# Patient Record
Sex: Female | Born: 1960 | Race: White | Hispanic: No | Marital: Married | Smoking: Current every day smoker
Health system: Southern US, Community
[De-identification: ages and names within clinical notes are randomized; demographics above are authoritative.]

## PROBLEM LIST (undated history)

## (undated) DIAGNOSIS — R7303 Prediabetes: Secondary | ICD-10-CM

## (undated) DIAGNOSIS — G20A1 Parkinson's disease without dyskinesia, without mention of fluctuations: Secondary | ICD-10-CM

## (undated) DIAGNOSIS — E559 Vitamin D deficiency, unspecified: Secondary | ICD-10-CM

## (undated) DIAGNOSIS — A630 Anogenital (venereal) warts: Secondary | ICD-10-CM

## (undated) DIAGNOSIS — N390 Urinary tract infection, site not specified: Secondary | ICD-10-CM

## (undated) DIAGNOSIS — R2689 Other abnormalities of gait and mobility: Secondary | ICD-10-CM

## (undated) DIAGNOSIS — G4730 Sleep apnea, unspecified: Secondary | ICD-10-CM

## (undated) DIAGNOSIS — E785 Hyperlipidemia, unspecified: Secondary | ICD-10-CM

## (undated) DIAGNOSIS — E538 Deficiency of other specified B group vitamins: Secondary | ICD-10-CM

## (undated) DIAGNOSIS — B019 Varicella without complication: Secondary | ICD-10-CM

## (undated) DIAGNOSIS — F419 Anxiety disorder, unspecified: Secondary | ICD-10-CM

## (undated) DIAGNOSIS — K5792 Diverticulitis of intestine, part unspecified, without perforation or abscess without bleeding: Secondary | ICD-10-CM

## (undated) DIAGNOSIS — F32A Depression, unspecified: Secondary | ICD-10-CM

## (undated) DIAGNOSIS — R42 Dizziness and giddiness: Secondary | ICD-10-CM

## (undated) DIAGNOSIS — J301 Allergic rhinitis due to pollen: Secondary | ICD-10-CM

## (undated) DIAGNOSIS — I639 Cerebral infarction, unspecified: Secondary | ICD-10-CM

## (undated) DIAGNOSIS — K219 Gastro-esophageal reflux disease without esophagitis: Secondary | ICD-10-CM

## (undated) DIAGNOSIS — N83209 Unspecified ovarian cyst, unspecified side: Secondary | ICD-10-CM

## (undated) DIAGNOSIS — G20 Parkinson's disease: Secondary | ICD-10-CM

## (undated) HISTORY — DX: Deficiency of other specified B group vitamins: E53.8

## (undated) HISTORY — DX: Cerebral infarction, unspecified: I63.9

## (undated) HISTORY — DX: Varicella without complication: B01.9

## (undated) HISTORY — DX: Parkinson's disease without dyskinesia, without mention of fluctuations: G20.A1

## (undated) HISTORY — PX: CERVIX REMOVAL: SHX592

## (undated) HISTORY — DX: Sleep apnea, unspecified: G47.30

## (undated) HISTORY — DX: Urinary tract infection, site not specified: N39.0

## (undated) HISTORY — DX: Other abnormalities of gait and mobility: R26.89

## (undated) HISTORY — DX: Hyperlipidemia, unspecified: E78.5

## (undated) HISTORY — DX: Anogenital (venereal) warts: A63.0

## (undated) HISTORY — DX: Allergic rhinitis due to pollen: J30.1

## (undated) HISTORY — DX: Parkinson's disease: G20

## (undated) HISTORY — DX: Vitamin D deficiency, unspecified: E55.9

---

## 1998-06-16 ENCOUNTER — Encounter: Admission: RE | Admit: 1998-06-16 | Discharge: 1998-06-16 | Payer: Self-pay | Admitting: Internal Medicine

## 1998-06-27 ENCOUNTER — Encounter: Payer: Self-pay | Admitting: Internal Medicine

## 1998-06-27 ENCOUNTER — Ambulatory Visit (HOSPITAL_COMMUNITY): Admission: RE | Admit: 1998-06-27 | Discharge: 1998-06-27 | Payer: Self-pay | Admitting: Internal Medicine

## 1998-06-27 ENCOUNTER — Encounter: Admission: RE | Admit: 1998-06-27 | Discharge: 1998-06-27 | Payer: Self-pay | Admitting: Internal Medicine

## 2000-04-22 ENCOUNTER — Other Ambulatory Visit: Admission: RE | Admit: 2000-04-22 | Discharge: 2000-04-22 | Payer: Self-pay | Admitting: Obstetrics and Gynecology

## 2013-07-23 ENCOUNTER — Telehealth: Payer: Self-pay | Admitting: Family Medicine

## 2013-07-23 NOTE — Telephone Encounter (Signed)
Pt has a new pt apptmt scheduled for 08/16/2012.  She is having a lot of trouble w/right leg pain and needs to be seen sooner. Can you accommodate her an apptmt prior to 08/16/2012? Thank you.

## 2013-07-24 NOTE — Telephone Encounter (Signed)
Yes ok to accommodate sooner

## 2013-07-24 NOTE — Telephone Encounter (Signed)
Left mssg for pt to c/b °

## 2013-07-25 NOTE — Telephone Encounter (Signed)
Left another mssg for pt to c/b

## 2013-07-27 NOTE — Telephone Encounter (Signed)
sch 08/07/2013

## 2013-08-07 ENCOUNTER — Ambulatory Visit (INDEPENDENT_AMBULATORY_CARE_PROVIDER_SITE_OTHER): Payer: BC Managed Care – PPO | Admitting: Family Medicine

## 2013-08-07 ENCOUNTER — Encounter: Payer: Self-pay | Admitting: Family Medicine

## 2013-08-07 VITALS — BP 132/72 | HR 76 | Temp 98.3°F | Ht 64.0 in | Wt 234.0 lb

## 2013-08-07 DIAGNOSIS — R259 Unspecified abnormal involuntary movements: Secondary | ICD-10-CM

## 2013-08-07 DIAGNOSIS — M79605 Pain in left leg: Secondary | ICD-10-CM | POA: Insufficient documentation

## 2013-08-07 DIAGNOSIS — IMO0001 Reserved for inherently not codable concepts without codable children: Secondary | ICD-10-CM

## 2013-08-07 DIAGNOSIS — Z136 Encounter for screening for cardiovascular disorders: Secondary | ICD-10-CM

## 2013-08-07 DIAGNOSIS — R635 Abnormal weight gain: Secondary | ICD-10-CM

## 2013-08-07 DIAGNOSIS — R251 Tremor, unspecified: Secondary | ICD-10-CM | POA: Insufficient documentation

## 2013-08-07 DIAGNOSIS — M79609 Pain in unspecified limb: Secondary | ICD-10-CM

## 2013-08-07 DIAGNOSIS — M7918 Myalgia, other site: Secondary | ICD-10-CM

## 2013-08-07 LAB — LIPID PANEL
Cholesterol: 197 mg/dL (ref 0–200)
HDL: 62.7 mg/dL (ref >39.00)
LDL Cholesterol: 121 mg/dL — ABNORMAL HIGH (ref 0–99)
Total CHOL/HDL Ratio: 3
Triglycerides: 68 mg/dL (ref 0.0–149.0)
VLDL: 13.6 mg/dL (ref 0.0–40.0)

## 2013-08-07 LAB — CBC WITH DIFFERENTIAL/PLATELET
Basophils Absolute: 0 10*3/uL (ref 0.0–0.1)
Basophils Relative: 0.6 % (ref 0.0–3.0)
Eosinophils Absolute: 0.3 10*3/uL (ref 0.0–0.7)
Eosinophils Relative: 5.2 % — ABNORMAL HIGH (ref 0.0–5.0)
HCT: 42 % (ref 36.0–46.0)
Hemoglobin: 14 g/dL (ref 12.0–15.0)
Lymphocytes Relative: 36.2 % (ref 12.0–46.0)
Lymphs Abs: 1.9 10*3/uL (ref 0.7–4.0)
MCHC: 33.3 g/dL (ref 30.0–36.0)
MCV: 86.2 fl (ref 78.0–100.0)
Monocytes Absolute: 0.4 10*3/uL (ref 0.1–1.0)
Monocytes Relative: 6.8 % (ref 3.0–12.0)
Neutro Abs: 2.7 10*3/uL (ref 1.4–7.7)
Neutrophils Relative %: 51.2 % (ref 43.0–77.0)
Platelets: 215 10*3/uL (ref 150.0–400.0)
RBC: 4.87 Mil/uL (ref 3.87–5.11)
RDW: 14 % (ref 11.5–14.6)
WBC: 5.3 10*3/uL (ref 4.5–10.5)

## 2013-08-07 LAB — COMPREHENSIVE METABOLIC PANEL
ALT: 20 U/L (ref 0–35)
AST: 17 U/L (ref 0–37)
Albumin: 4.3 g/dL (ref 3.5–5.2)
Alkaline Phosphatase: 77 U/L (ref 39–117)
BUN: 13 mg/dL (ref 6–23)
CO2: 29 mEq/L (ref 19–32)
Calcium: 9.4 mg/dL (ref 8.4–10.5)
Chloride: 106 mEq/L (ref 96–112)
Creatinine, Ser: 0.7 mg/dL (ref 0.4–1.2)
GFR: 102.2 mL/min (ref >60.00)
Glucose, Bld: 83 mg/dL (ref 70–99)
Potassium: 4.2 mEq/L (ref 3.5–5.1)
Sodium: 141 mEq/L (ref 135–145)
Total Bilirubin: 0.8 mg/dL (ref 0.3–1.2)
Total Protein: 7.3 g/dL (ref 6.0–8.3)

## 2013-08-07 LAB — HEMOGLOBIN A1C: Hgb A1c MFr Bld: 5.8 % (ref 4.6–6.5)

## 2013-08-07 LAB — TSH: TSH: 1.26 u[IU]/mL (ref 0.35–5.50)

## 2013-08-07 MED ORDER — PREDNISONE 20 MG PO TABS: ORAL_TABLET | ORAL | Status: DC

## 2013-08-07 NOTE — Patient Instructions (Addendum)
It was nice to meet you. Take prednisone as directed.  We will call you with your lab results.  Please schedule a complete physical/pap smear at your convenience.

## 2013-08-07 NOTE — Assessment & Plan Note (Signed)
?  shingles rash now with some PHN. Given course of prednisone.  If no improvement, consider elavil. She will follow up with me in a few weeks.

## 2013-08-07 NOTE — Progress Notes (Signed)
   Subjective:    Patient ID: Kristina Savage, female    DOB: 1960/12/30, 53 y.o.   MRN: [id number]  HPI Very pleasant 53 yo female here to establish care and for:  1.  Left knee pain- In July, developed a very painful and achy rash over lateral aspect of left knee.  Prior to developing rash, had an ache in her knee and felt like it was falling asleep- pin pricks. She did not take pictures of the rash.  Rash has since resolved, but she still has some aching in that knee.  It is gradually getting better.  Never had anything like this before.  2.  Left buttocks pain- started to sleep differently and walk with a limp when rash/knee pain developed.  A few months later, left buttocks pain that can shoot down back of thigh.  No known injury.  Still very painful with certain movements.  Has not had a CPX or blood work done in years.  3.  At times, feels shaky.  Son has DMI.  Denies any sweating, nausea or vomiting.  Does not seem to be improved with food but she is not sure.  Patient Active Problem List   Diagnosis Date Noted  . Shakiness 08/07/2013  . Left leg pain 08/07/2013  . Pain in left buttock 08/07/2013   Past Medical History  Diagnosis Date  . Genital warts   . Chicken pox   . Hay fever   . UTI (lower urinary tract infection)    Past Surgical History  Procedure Laterality Date  . Cervix removal    . Cesarean section     History  Substance Use Topics  . Smoking status: Current Every Day Smoker  . Smokeless tobacco: Not on file  . Alcohol Use: Yes   Family History  Problem Relation Age of Onset  . Arthritis Mother   . Hypertension Mother   . Arthritis Father   . Diabetes Son   . Cancer Maternal Aunt   . Diabetes Paternal Grandmother    Allergies no known allergies No current outpatient prescriptions on file prior to visit.   No current facility-administered medications on file prior to visit.   The PMH, PSH, Social History, Family History, Medications, and allergies  have been reviewed in [facility], and have been updated if relevant.    Review of Systems  Constitutional: Negative for chills, diaphoresis, activity change, appetite change and fatigue.  Eyes: Negative for visual disturbance.  Gastrointestinal: Negative for nausea, vomiting and diarrhea.  Musculoskeletal: Negative for joint swelling.  Skin: Positive for rash.   See HPI     Objective:   Physical Exam  Constitutional: She appears well-developed and well-nourished. No distress.  HENT:  Head: Normocephalic and atraumatic.  Musculoskeletal:       Right shoulder: She exhibits no deformity and no laceration.  SLR neg bilaterally POS fabers left, neg fabers right Normal gait  Skin: Skin is warm, dry and intact. No rash noted.  Psychiatric: She has a normal mood and affect. Her speech is normal and behavior is normal. Judgment and thought content normal. Cognition and memory are normal.          Assessment & Plan:

## 2013-08-07 NOTE — Progress Notes (Signed)
Pre-visit discussion using our clinic review tool. No additional management support is needed unless otherwise documented below in the visit note.  

## 2013-08-07 NOTE — Assessment & Plan Note (Signed)
Labs today since she is fasting. Follow up for CPX.

## 2013-08-07 NOTE — Assessment & Plan Note (Signed)
Most consistent with piriformis syndrome. Discussed exercises. Given rx for prednisone to help with inflammation and PHN.

## 2013-08-08 ENCOUNTER — Telehealth: Payer: Self-pay | Admitting: Family Medicine

## 2013-08-08 ENCOUNTER — Encounter: Payer: Self-pay | Admitting: *Deleted

## 2013-08-08 NOTE — Telephone Encounter (Signed)
Relevant patient education assigned to patient using Emmi. ° °

## 2013-08-16 ENCOUNTER — Ambulatory Visit: Payer: Self-pay | Admitting: Family Medicine

## 2013-09-17 ENCOUNTER — Ambulatory Visit: Payer: BC Managed Care – PPO | Admitting: Family Medicine

## 2013-09-18 ENCOUNTER — Ambulatory Visit: Payer: BC Managed Care – PPO | Admitting: Family Medicine

## 2013-09-27 ENCOUNTER — Ambulatory Visit (INDEPENDENT_AMBULATORY_CARE_PROVIDER_SITE_OTHER): Payer: BC Managed Care – PPO | Admitting: Family Medicine

## 2013-09-27 ENCOUNTER — Encounter: Payer: Self-pay | Admitting: Family Medicine

## 2013-09-27 VITALS — BP 138/70 | HR 67 | Temp 98.2°F | Wt 242.0 lb

## 2013-09-27 DIAGNOSIS — M79609 Pain in unspecified limb: Secondary | ICD-10-CM

## 2013-09-27 DIAGNOSIS — IMO0001 Reserved for inherently not codable concepts without codable children: Secondary | ICD-10-CM

## 2013-09-27 DIAGNOSIS — M79605 Pain in left leg: Secondary | ICD-10-CM

## 2013-09-27 DIAGNOSIS — M7918 Myalgia, other site: Secondary | ICD-10-CM

## 2013-09-27 MED ORDER — PREDNISONE 20 MG PO TABS: ORAL_TABLET | ORAL | Status: DC

## 2013-09-27 NOTE — Assessment & Plan Note (Signed)
Piriformis syndrome almost resolving- will try another round of prednisone. Continue exercises. Call or return to clinic prn if these symptoms worsen or fail to improve as anticipated. The patient indicates understanding of these issues and agrees with the plan.

## 2013-09-27 NOTE — Patient Instructions (Signed)
Great to see you. Let's repeat the course of prednisone.  Update me if you're not better after this round of prednisone.

## 2013-09-27 NOTE — Progress Notes (Signed)
   Subjective:    Patient ID: Kristina Savage, female    DOB: 02/16/1961, 53 y.o.   MRN: [id number]  HPI Very pleasant 53 yo female here to follow up left buttocks pain.   Saw her in 07/2013 for left buttocks pain- started to sleep differently and walk with a limp when rash/knee pain developed.  A few months later, left buttocks pain that can shoot down back of thigh.  No known injury.    Seemed consistent with piriformis syndrome.  Also ?PHN s/p shingles rash. Given course of prednisone and is here for follow up today.    Leg pain resolved completely.  Buttocks pain much better but still feels it when she is doing activities.   Patient Active Problem List   Diagnosis Date Noted  . Shakiness 08/07/2013  . Left leg pain 08/07/2013  . Pain in left buttock 08/07/2013   Past Medical History  Diagnosis Date  . Genital warts   . Chicken pox   . Hay fever   . UTI (lower urinary tract infection)    Past Surgical History  Procedure Laterality Date  . Cervix removal    . Cesarean section     History  Substance Use Topics  . Smoking status: Current Every Day Smoker  . Smokeless tobacco: Not on file  . Alcohol Use: Yes   Family History  Problem Relation Age of Onset  . Arthritis Mother   . Hypertension Mother   . Arthritis Father   . Diabetes Son   . Cancer Maternal Aunt   . Diabetes Paternal Grandmother    No Known Allergies No current outpatient prescriptions on file prior to visit.   No current facility-administered medications on file prior to visit.   The PMH, PSH, Social History, Family History, Medications, and allergies have been reviewed in [facility], and have been updated if relevant.    Review of Systems  Constitutional: Negative for chills, diaphoresis, activity change, appetite change and fatigue.  Eyes: Negative for visual disturbance.  Gastrointestinal: Negative for nausea, vomiting and diarrhea.  Musculoskeletal: Negative for joint swelling.  Skin: Positive  for rash.   See HPI     Objective:   Physical Exam  Constitutional: She appears well-developed and well-nourished. No distress.  HENT:  Head: Normocephalic and atraumatic.  Musculoskeletal:       Right shoulder: She exhibits no deformity and no laceration.  SLR neg bilaterally mildy positive fabers left, neg fabers right Normal gait  Skin: Skin is warm, dry and intact. No rash noted.  Psychiatric: She has a normal mood and affect. Her speech is normal and behavior is normal. Judgment and thought content normal. Cognition and memory are normal.    BP 138/70  Pulse 67  Temp(Src) 98.2 F (36.8 C) (Oral)  Wt 242 lb (109.77 kg)  SpO2 98%       Assessment & Plan:

## 2013-09-27 NOTE — Progress Notes (Signed)
Pre visit review using our clinic review tool, if applicable. No additional management support is needed unless otherwise documented below in the visit note. 

## 2013-09-27 NOTE — Assessment & Plan Note (Signed)
Resolved. No further tx.

## 2013-09-28 ENCOUNTER — Telehealth: Payer: Self-pay | Admitting: Family Medicine

## 2013-09-28 NOTE — Telephone Encounter (Signed)
Relevant patient education assigned to patient using Emmi. ° °

## 2013-11-28 ENCOUNTER — Encounter: Payer: Self-pay | Admitting: Internal Medicine

## 2013-11-28 ENCOUNTER — Ambulatory Visit (INDEPENDENT_AMBULATORY_CARE_PROVIDER_SITE_OTHER): Payer: BC Managed Care – PPO | Admitting: Internal Medicine

## 2013-11-28 VITALS — BP 118/72 | HR 81 | Temp 98.1°F | Wt 234.8 lb

## 2013-11-28 DIAGNOSIS — J309 Allergic rhinitis, unspecified: Secondary | ICD-10-CM

## 2013-11-28 DIAGNOSIS — R05 Cough: Secondary | ICD-10-CM

## 2013-11-28 DIAGNOSIS — R059 Cough, unspecified: Secondary | ICD-10-CM

## 2013-11-28 MED ORDER — BENZONATATE 200 MG PO CAPS: 200.0000 mg | ORAL_CAPSULE | Freq: Two times a day (BID) | ORAL | Status: DC | PRN

## 2013-11-28 MED ORDER — HYDROCODONE-HOMATROPINE 5-1.5 MG/5ML PO SYRP: 5.0000 mL | ORAL_SOLUTION | Freq: Three times a day (TID) | ORAL | Status: DC | PRN

## 2013-11-28 NOTE — Progress Notes (Signed)
Pre visit review using our clinic review tool, if applicable. No additional management support is needed unless otherwise documented below in the visit note. 

## 2013-11-28 NOTE — Progress Notes (Signed)
HPI  Pt presents to the clinic today with c/o cough and chest congestion. She reports this started 3 days ago. The cough is non productive. She has had some clear rhinorrhea and itchy, watery eyes. She has been taking OTC sinus meds. She denies fever, chills or body aches. She has not had sick contacts. She is a current smoker.  Review of Systems      Past Medical History  Diagnosis Date  . Genital warts   . Chicken pox   . Hay fever   . UTI (lower urinary tract infection)     Family History  Problem Relation Age of Onset  . Arthritis Mother   . Hypertension Mother   . Arthritis Father   . Diabetes Son   . Cancer Maternal Aunt   . Diabetes Paternal Grandmother     History   Social History  . Marital Status: Married    Spouse Name: N/A    Number of Children: N/A  . Years of Education: N/A   Occupational History  . Not on file.   Social History Main Topics  . Smoking status: Current Every Day Smoker  . Smokeless tobacco: Not on file  . Alcohol Use: Yes  . Drug Use: No  . Sexual Activity: Not on file   Other Topics Concern  . Not on file   Social History Narrative  . No narrative on file    No Known Allergies   Constitutional:  Denies headache, fatigue, fever or abrupt weight changes.  HEENT:  Positive runny nose. . Denies eye redness, eye pain, pressure behind the eyes, facial pain, nasal congestion, ear pain, ringing in the ears, wax buildup, or bloody nose. Respiratory: Positive cough. Denies difficulty breathing or shortness of breath.  Cardiovascular: Denies chest pain, chest tightness, palpitations or swelling in the hands or feet.   No other specific complaints in a complete review of systems (except as listed in HPI above).  Objective:   BP 118/72  Pulse 81  Temp(Src) 98.1 F (36.7 C) (Oral)  Wt 234 lb 12 oz (106.482 kg)  SpO2 98% Wt Readings from Last 3 Encounters:  11/28/13 234 lb 12 oz (106.482 kg)  09/27/13 242 lb (109.77 kg)  08/07/13  234 lb (106.142 kg)     General: Appears her stated age, well developed, well nourished in NAD. HEENT: Head: normal shape and size; Eyes: sclera white, no icterus, conjunctiva pink, PERRLA and EOMs intact; Ears: Tm's gray and intact, normal light reflex; Nose: mucosa pink and moist, septum midline; Throat/Mouth: + PND. Teeth present, mucosa erythematous and moist, no exudate noted, no lesions or ulcerations noted.  Neck:  Neck supple, trachea midline. No massses, lumps or thyromegaly present.  Cardiovascular: Normal rate and rhythm. S1,S2 noted.  No murmur, rubs or gallops noted. No JVD or BLE edema. No carotid bruits noted. Pulmonary/Chest: Normal effort and positive vesicular breath sounds. No respiratory distress. No wheezes, rales or ronchi noted.      Assessment & Plan:   Allergic Rhinitis:  Get some rest and drink plenty of water Already taking zyrtec, start the flonase you have at home Will give tessalon pearles for during the day and Hycodan at night  RTC as needed or if symptoms persist.

## 2013-11-28 NOTE — Patient Instructions (Addendum)
Allergic Rhinitis Allergic rhinitis is when the mucous membranes in the nose respond to allergens. Allergens are particles in the air that cause your body to have an allergic reaction. This causes you to release allergic antibodies. Through a chain of events, these eventually cause you to release histamine into the blood stream. Although meant to protect the body, it is this release of histamine that causes your discomfort, such as frequent sneezing, congestion, and an itchy, runny nose.  CAUSES  Seasonal allergic rhinitis (hay fever) is caused by pollen allergens that may come from grasses, trees, and weeds. Year-round allergic rhinitis (perennial allergic rhinitis) is caused by allergens such as house dust mites, pet dander, and mold spores.  SYMPTOMS   Nasal stuffiness (congestion).  Itchy, runny nose with sneezing and tearing of the eyes. DIAGNOSIS  Your health care provider can help you determine the allergen or allergens that trigger your symptoms. If you and your health care provider are unable to determine the allergen, skin or blood testing may be used. TREATMENT  Allergic Rhinitis does not have a cure, but it can be controlled by:  Medicines and allergy shots (immunotherapy).  Avoiding the allergen. Hay fever may often be treated with antihistamines in pill or nasal spray forms. Antihistamines block the effects of histamine. There are over-the-counter medicines that may help with nasal congestion and swelling around the eyes. Check with your health care provider before taking or giving this medicine.  If avoiding the allergen or the medicine prescribed do not work, there are many new medicines your health care provider can prescribe. Stronger medicine may be used if initial measures are ineffective. Desensitizing injections can be used if medicine and avoidance does not work. Desensitization is when a patient is given ongoing shots until the body becomes less sensitive to the allergen.  Make sure you follow up with your health care provider if problems continue. HOME CARE INSTRUCTIONS It is not possible to completely avoid allergens, but you can reduce your symptoms by taking steps to limit your exposure to them. It helps to know exactly what you are allergic to so that you can avoid your specific triggers. SEEK MEDICAL CARE IF:   You have a fever.  You develop a cough that does not stop easily (persistent).  You have shortness of breath.  You start wheezing.  Symptoms interfere with normal daily activities. Document Released: 04/06/2001 Document Revised: 05/02/2013 Document Reviewed: 03/19/2013 [employer] Patient Information 2014 [employer].  

## 2014-07-18 ENCOUNTER — Emergency Department (HOSPITAL_COMMUNITY): Payer: BC Managed Care – PPO

## 2014-07-18 ENCOUNTER — Emergency Department (HOSPITAL_COMMUNITY)
Admission: EM | Admit: 2014-07-18 | Discharge: 2014-07-18 | Disposition: A | Discharge status: Home / Self Care | Payer: BC Managed Care – PPO | Attending: Emergency Medicine | Admitting: Emergency Medicine

## 2014-07-18 ENCOUNTER — Encounter (HOSPITAL_COMMUNITY): Payer: Self-pay | Admitting: *Deleted

## 2014-07-18 DIAGNOSIS — K5792 Diverticulitis of intestine, part unspecified, without perforation or abscess without bleeding: Secondary | ICD-10-CM | POA: Insufficient documentation

## 2014-07-18 DIAGNOSIS — Z79899 Other long term (current) drug therapy: Secondary | ICD-10-CM | POA: Diagnosis not present

## 2014-07-18 DIAGNOSIS — Z3202 Encounter for pregnancy test, result negative: Secondary | ICD-10-CM | POA: Diagnosis not present

## 2014-07-18 DIAGNOSIS — R61 Generalized hyperhidrosis: Secondary | ICD-10-CM | POA: Diagnosis not present

## 2014-07-18 DIAGNOSIS — Z87440 Personal history of urinary (tract) infections: Secondary | ICD-10-CM | POA: Insufficient documentation

## 2014-07-18 DIAGNOSIS — Z8619 Personal history of other infectious and parasitic diseases: Secondary | ICD-10-CM | POA: Insufficient documentation

## 2014-07-18 DIAGNOSIS — Z720 Tobacco use: Secondary | ICD-10-CM | POA: Diagnosis not present

## 2014-07-18 DIAGNOSIS — R109 Unspecified abdominal pain: Secondary | ICD-10-CM | POA: Diagnosis present

## 2014-07-18 DIAGNOSIS — Z8709 Personal history of other diseases of the respiratory system: Secondary | ICD-10-CM | POA: Insufficient documentation

## 2014-07-18 DIAGNOSIS — Z90712 Acquired absence of cervix with remaining uterus: Secondary | ICD-10-CM | POA: Diagnosis not present

## 2014-07-18 DIAGNOSIS — R1030 Lower abdominal pain, unspecified: Secondary | ICD-10-CM

## 2014-07-18 LAB — COMPREHENSIVE METABOLIC PANEL
ALT: 27 U/L (ref 0–35)
ANION GAP: 5 (ref 5–15)
AST: 20 U/L (ref 0–37)
Albumin: 3.3 g/dL — ABNORMAL LOW (ref 3.5–5.2)
Alkaline Phosphatase: 72 U/L (ref 39–117)
BUN: 9 mg/dL (ref 6–23)
CALCIUM: 8.9 mg/dL (ref 8.4–10.5)
CO2: 29 mmol/L (ref 19–32)
CREATININE: 0.68 mg/dL (ref 0.50–1.10)
Chloride: 106 mEq/L (ref 96–112)
GLUCOSE: 112 mg/dL — AB (ref 70–99)
Potassium: 4.2 mmol/L (ref 3.5–5.1)
SODIUM: 140 mmol/L (ref 135–145)
TOTAL PROTEIN: 5.8 g/dL — AB (ref 6.0–8.3)
Total Bilirubin: 1 mg/dL (ref 0.3–1.2)

## 2014-07-18 LAB — POC URINE PREG, ED: Preg Test, Ur: NEGATIVE

## 2014-07-18 LAB — CBC WITH DIFFERENTIAL/PLATELET
Basophils Absolute: 0 10*3/uL (ref 0.0–0.1)
Basophils Relative: 0 % (ref 0–1)
EOS ABS: 0.1 10*3/uL (ref 0.0–0.7)
EOS PCT: 1 % (ref 0–5)
HEMATOCRIT: 40 % (ref 36.0–46.0)
Hemoglobin: 12.8 g/dL (ref 12.0–15.0)
LYMPHS ABS: 1.4 10*3/uL (ref 0.7–4.0)
LYMPHS PCT: 13 % (ref 12–46)
MCH: 27.8 pg (ref 26.0–34.0)
MCHC: 32 g/dL (ref 30.0–36.0)
MCV: 87 fL (ref 78.0–100.0)
MONO ABS: 0.6 10*3/uL (ref 0.1–1.0)
MONOS PCT: 6 % (ref 3–12)
Neutro Abs: 8.3 10*3/uL — ABNORMAL HIGH (ref 1.7–7.7)
Neutrophils Relative %: 80 % — ABNORMAL HIGH (ref 43–77)
PLATELETS: 169 10*3/uL (ref 150–400)
RBC: 4.6 MIL/uL (ref 3.87–5.11)
RDW: 13.9 % (ref 11.5–15.5)
WBC: 10.4 10*3/uL (ref 4.0–10.5)

## 2014-07-18 LAB — URINALYSIS, ROUTINE W REFLEX MICROSCOPIC
Bilirubin Urine: NEGATIVE
Glucose, UA: NEGATIVE mg/dL
Hgb urine dipstick: NEGATIVE
Ketones, ur: NEGATIVE mg/dL
Leukocytes, UA: NEGATIVE
NITRITE: NEGATIVE
PROTEIN: NEGATIVE mg/dL
SPECIFIC GRAVITY, URINE: 1.012 (ref 1.005–1.030)
Urobilinogen, UA: 0.2 mg/dL (ref 0.0–1.0)
pH: 6.5 (ref 5.0–8.0)

## 2014-07-18 LAB — I-STAT CG4 LACTIC ACID, ED: Lactic Acid, Venous: 0.62 mmol/L (ref 0.5–2.2)

## 2014-07-18 MED ORDER — CIPROFLOXACIN HCL 500 MG PO TABS: 500.0000 mg | ORAL_TABLET | Freq: Two times a day (BID) | ORAL | Status: DC

## 2014-07-18 MED ORDER — DICYCLOMINE HCL 10 MG PO CAPS
10.0000 mg | ORAL_CAPSULE | Freq: Once | ORAL | Status: AC
  Administered 2014-07-18: 10 mg via ORAL
  Filled 2014-07-18: qty 1

## 2014-07-18 MED ORDER — OXYCODONE-ACETAMINOPHEN 5-325 MG PO TABS: 1.0000 | ORAL_TABLET | Freq: Four times a day (QID) | ORAL | Status: DC | PRN

## 2014-07-18 MED ORDER — IOHEXOL 300 MG/ML  SOLN
25.0000 mL | Freq: Once | INTRAMUSCULAR | Status: AC | PRN
  Administered 2014-07-18: 25 mL via ORAL

## 2014-07-18 MED ORDER — SODIUM CHLORIDE 0.9 % IV BOLUS (SEPSIS)
1000.0000 mL | Freq: Once | INTRAVENOUS | Status: AC
  Administered 2014-07-18: 1000 mL via INTRAVENOUS

## 2014-07-18 MED ORDER — METRONIDAZOLE 500 MG PO TABS: 500.0000 mg | ORAL_TABLET | Freq: Two times a day (BID) | ORAL | Status: DC

## 2014-07-18 MED ORDER — METRONIDAZOLE IN NACL 5-0.79 MG/ML-% IV SOLN
500.0000 mg | Freq: Once | INTRAVENOUS | Status: AC
  Administered 2014-07-18: 500 mg via INTRAVENOUS
  Filled 2014-07-18: qty 100

## 2014-07-18 MED ORDER — HYDROMORPHONE HCL 1 MG/ML IJ SOLN
1.0000 mg | Freq: Once | INTRAMUSCULAR | Status: AC
  Administered 2014-07-18: 1 mg via INTRAVENOUS
  Filled 2014-07-18: qty 1

## 2014-07-18 MED ORDER — MORPHINE SULFATE 4 MG/ML IJ SOLN
4.0000 mg | Freq: Once | INTRAMUSCULAR | Status: AC
  Administered 2014-07-18: 4 mg via INTRAVENOUS
  Filled 2014-07-18: qty 1

## 2014-07-18 MED ORDER — IOHEXOL 300 MG/ML  SOLN
100.0000 mL | Freq: Once | INTRAMUSCULAR | Status: AC | PRN
  Administered 2014-07-18: 100 mL via INTRAVENOUS

## 2014-07-18 MED ORDER — CIPROFLOXACIN IN D5W 400 MG/200ML IV SOLN
400.0000 mg | Freq: Two times a day (BID) | INTRAVENOUS | Status: DC
  Administered 2014-07-18: 400 mg via INTRAVENOUS
  Filled 2014-07-18: qty 200

## 2014-07-18 MED ORDER — OXYCODONE-ACETAMINOPHEN 5-325 MG PO TABS
2.0000 | ORAL_TABLET | Freq: Once | ORAL | Status: AC
  Administered 2014-07-18: 2 via ORAL
  Filled 2014-07-18: qty 2

## 2014-07-18 NOTE — ED Provider Notes (Signed)
CSN: [id number]     Arrival date & time 07/18/14  4098 History   First MD Initiated Contact with Patient 07/18/14 [phone]     Chief Complaint  Patient presents with  . Abdominal Pain     (Consider location/radiation/quality/duration/timing/severity/associated sxs/prior Treatment) HPI Comments: Patient reports waxing and waning lower abdominal pain since around 3:57 PM last night.  She's had no associated nausea, vomiting, fever.  She has had chills and episodes of diaphoresis.  She denies urinary frequency, dysuria or hematuria.  She had a normal bowel movement yesterday and today.  She's had no loose or watery stools.  She states pain is sharp.  She currently states the pain has no radiation that he ate last night.  Did have some radiation to the back.  She reports having similar pain years ago when she had an infection of her reproductive system, but denies having vaginal discharge or bleeding.  Patient is a 53 y.o. female presenting with abdominal pain.  Abdominal Pain Associated symptoms: no chest pain, no chills, no cough, no diarrhea, no dysuria, no fatigue, no fever, no nausea, no shortness of breath, no sore throat and no vomiting     Past Medical History  Diagnosis Date  . Genital warts   . Chicken pox   . Hay fever   . UTI (lower urinary tract infection)    Past Surgical History  Procedure Laterality Date  . Cervix removal    . Cesarean section     Family History  Problem Relation Age of Onset  . Arthritis Mother   . Hypertension Mother   . Arthritis Father   . Diabetes Son   . Cancer Maternal Aunt   . Diabetes Paternal Grandmother    History  Substance Use Topics  . Smoking status: Current Every Day Smoker -- 0.50 packs/day    Types: Cigarettes  . Smokeless tobacco: Never Used  . Alcohol Use: Yes   OB History    No data available     Review of Systems  Constitutional: Negative for fever, chills, diaphoresis, activity change, appetite change and fatigue.   HENT: Negative for congestion, facial swelling, rhinorrhea and sore throat.   Eyes: Negative for photophobia and discharge.  Respiratory: Negative for cough, chest tightness and shortness of breath.   Cardiovascular: Negative for chest pain, palpitations and leg swelling.  Gastrointestinal: Positive for abdominal pain. Negative for nausea, vomiting and diarrhea.  Endocrine: Negative for polydipsia and polyuria.  Genitourinary: Negative for dysuria, frequency, difficulty urinating and pelvic pain.  Musculoskeletal: Negative for back pain, arthralgias, neck pain and neck stiffness.  Skin: Negative for color change and wound.  Allergic/Immunologic: Negative for immunocompromised state.  Neurological: Negative for facial asymmetry, weakness, numbness and headaches.  Hematological: Does not bruise/bleed easily.  Psychiatric/Behavioral: Negative for confusion and agitation.      Allergies  Review of patient's allergies indicates no known allergies.  Home Medications   Prior to Admission medications   Medication Sig Start Date End Date Taking? Authorizing Provider  PROVENTIL HFA 108 (90 BASE) MCG/ACT inhaler Inhale 2 puffs into the lungs daily as needed for shortness of breath.  05/17/14  Yes Historical Provider, MD  pseudoephedrine-acetaminophen (TYLENOL SINUS) 30-500 MG TABS Take 1 tablet by mouth every 4 (four) hours as needed (for cold).   Yes Historical Provider, MD  benzonatate (TESSALON) 200 MG capsule Take 1 capsule (200 mg total) by mouth 2 (two) times daily as needed for cough. 11/28/13   Jearld Fenton, NP  ciprofloxacin (CIPRO) 500 MG tablet Take 1 tablet (500 mg total) by mouth 2 (two) times daily. One po bid x 10 days 07/18/14   Ernestina Patches, MD  HYDROcodone-homatropine [facility]) 5-1.5 MG/5ML syrup Take 5 mLs by mouth every 8 (eight) hours as needed for cough. 11/28/13   Jearld Fenton, NP  metroNIDAZOLE (FLAGYL) 500 MG tablet Take 1 tablet (500 mg total) by mouth 2 (two) times  daily. One po bid x 10 days 07/18/14   Ernestina Patches, MD  oxyCODONE-acetaminophen (PERCOCET) 5-325 MG per tablet Take 1-2 tablets by mouth every 6 (six) hours as needed. 07/18/14   Ernestina Patches, MD   BP 114/58 mmHg  Pulse 64  Temp(Src) 98.2 F (36.8 C) (Oral)  Resp 14  Ht 5\' 5"  (1.651 m)  Wt 240 lb (108.863 kg)  BMI 39.94 kg/m2  SpO2 100% Physical Exam  Constitutional: She is oriented to person, place, and time. She appears well-developed and well-nourished. No distress.  HENT:  Head: Normocephalic and atraumatic.  Mouth/Throat: No oropharyngeal exudate.  Eyes: Pupils are equal, round, and reactive to light.  Neck: Normal range of motion. Neck supple.  Cardiovascular: Normal rate, regular rhythm and normal heart sounds.  Exam reveals no gallop and no friction rub.   No murmur heard. Pulmonary/Chest: Effort normal and breath sounds normal. No respiratory distress. She has no wheezes. She has no rales.  Abdominal: Soft. Bowel sounds are normal. She exhibits no distension and no mass. There is tenderness in the suprapubic area. There is no rigidity, no rebound and no guarding.  Musculoskeletal: Normal range of motion. She exhibits no edema or tenderness.  Neurological: She is alert and oriented to person, place, and time.  Skin: Skin is warm and dry.  Psychiatric: She has a normal mood and affect.    ED Course  Procedures (including critical care time) Labs Review Labs Reviewed  CBC WITH DIFFERENTIAL - Abnormal; Notable for the following:    Neutrophils Relative % 80 (*)    Neutro Abs 8.3 (*)    All other components within normal limits  COMPREHENSIVE METABOLIC PANEL - Abnormal; Notable for the following:    Glucose, Bld 112 (*)    Total Protein 5.8 (*)    Albumin 3.3 (*)    All other components within normal limits  URINE CULTURE  URINALYSIS, ROUTINE W REFLEX MICROSCOPIC  I-STAT CG4 LACTIC ACID, ED  POC URINE PREG, ED    Imaging Review Ct Abdomen Pelvis W  Contrast  07/18/2014   CLINICAL DATA:  Lower abdominal pain for 1 day.  EXAM: CT ABDOMEN AND PELVIS WITH CONTRAST  TECHNIQUE: Multidetector CT imaging of the abdomen and pelvis was performed using the standard protocol following bolus administration of intravenous contrast.  CONTRAST:  137mL OMNIPAQUE IOHEXOL 300 MG/ML  SOLN  COMPARISON:  None.  FINDINGS: Lower chest: Subsegmental atelectasis, posterior basal segment left lower lobe. Contrast medium in the distal esophagus suspicious for gastroesophageal reflux.  Hepatobiliary: Unremarkable  Pancreas: Unremarkable  Spleen: Unremarkable  Adrenals/Urinary Tract: Fullness of both adrenal glands. 1.3 by 2.2 cm left adrenal mass, relative washout 50%, compatible with adenoma. 2.5 by 2.2 cm left kidney upper pole Bosniak category 1 cyst. 2.3 by 1.3 cm intraparenchymal cyst in the right mid kidney appears benign. No renal or ureteral calculi ; a calcification adjacent to the left distal ureter is shown to be below/outside the ureter on image 56 of series 5.  Stomach/Bowel: Prominent wall thickening and irregularity along a 7 cm region  of proximal sigmoid colon with surrounding mesenteric stranding and underlying diverticulosis, favoring active diverticulitis there is some adjacent free fluid by the cecum and tracking in the pelvis. No free intraperitoneal gas observed.  Vascular/Lymphatic: Unremarkable  Reproductive: Along the posterior margin of the left ovary, a 6.5 by 5.1 cm cystic lesion is present. I favor this as being an ovarian or adnexal cyst rather than an abscess related to the nearby diverticulitis. Some of the stranding from the diverticulitis does extend adjacent to this cystic lesion. There is no internal gas. The lesions is not appears thick walled as would typically be the case for an abscess. Uterus unremarkable.  Other: Small amount of free fluid in the pelvis as noted above.  Musculoskeletal: Unremarkable  IMPRESSION: 1. Sigmoid diverticulitis with  extensive mesenteric edema and mild pelvic ascites, but no free intraperitoneal gas. It may be prudent to perform colonoscopy in the next few months, if not recently performed, to establish a baseline and ensure that there is not an underlying sigmoid colon mass. 2. Cystic left ovarian lesion. Sonographic follow up is recommended within the next several weeks, following resolution of the patient's acute symptoms, for further characterization. I am doubtful that this is an abscess given the lack of gas and lack of thick enhancing walls. 3. Small left adrenal adenoma. 4. Gastroesophageal reflux.   Electronically Signed   By: Sherryl Barters M.D.   On: 07/18/2014 13:42     EKG Interpretation None      MDM   Final diagnoses:  Lower abdominal pain  Diverticulitis of intestine without perforation or abscess without bleeding    Pt is a 53 y.o. female with Pmhx as above who presents with lower abdominal pain since 3 PM yesterday which is worse in the suprapubic area on physical exam.  No rebound or guarding.  Vital signs are normal and she is afebrile.  She also denies urinary symptoms and vaginal bleeding or discharge.   No acute findings of CBC, CMP, LA, UA. Pain not resolved after 2 doses of pain medication.  CT with diverticulitis, and inicental L ovarian cystic lesion as well as mesenteric edema. Radiology recommending outpt colonscopy & [place]. Pt feeling improved after PO percocet. First dose cipro flagyl given and ED and pt tolerating PO. Will d/c home with cipro, flagyl, PO percocet.    Feliz Beam evaluation in the Emergency Department is complete. It has been determined that no acute conditions requiring further emergency intervention are present at this time. The patient/guardian have been advised of the diagnosis and plan. We have discussed signs and symptoms that warrant return to the ED, such as changes or worsening in symptoms, worsening pain, fever, inability to tolerate liquids.        Ernestina Patches, MD 07/18/14 [phone]

## 2014-07-18 NOTE — ED Notes (Signed)
Pt. Donated plasma yesterday and since the donation has had lower abdominal pain that comes and goes. Pt. States that the pain is sometimes a 10 and sometimes is a 3.

## 2014-07-18 NOTE — Discharge Instructions (Signed)

## 2014-07-18 NOTE — ED Notes (Signed)
Pt finished with contrast.

## 2014-07-20 LAB — URINE CULTURE

## 2014-07-22 ENCOUNTER — Ambulatory Visit (INDEPENDENT_AMBULATORY_CARE_PROVIDER_SITE_OTHER): Payer: BC Managed Care – PPO | Admitting: Internal Medicine

## 2014-07-22 ENCOUNTER — Encounter: Payer: Self-pay | Admitting: Internal Medicine

## 2014-07-22 VITALS — BP 118/74 | HR 72 | Temp 98.0°F | Wt 242.0 lb

## 2014-07-22 DIAGNOSIS — K5732 Diverticulitis of large intestine without perforation or abscess without bleeding: Secondary | ICD-10-CM

## 2014-07-22 DIAGNOSIS — N83202 Unspecified ovarian cyst, left side: Secondary | ICD-10-CM

## 2014-07-22 DIAGNOSIS — N8320 Unspecified ovarian cysts: Secondary | ICD-10-CM

## 2014-07-22 MED ORDER — HYDROCODONE-ACETAMINOPHEN 10-325 MG PO TABS: 1.0000 | ORAL_TABLET | Freq: Three times a day (TID) | ORAL | Status: DC | PRN

## 2014-07-22 NOTE — Patient Instructions (Signed)

## 2014-07-22 NOTE — Progress Notes (Signed)
Subjective:    Patient ID: Kristina Savage, female    DOB: 03-Mar-1961, 53 y.o.   MRN: [id number]  HPI  Pt presents to the clinic today for hosptial follow up. She went to [facility] ER 07/18/14 with c/o lower abdominal pain. CT scan of abdomen showed:  IMPRESSION: 1. Sigmoid diverticulitis with extensive mesenteric edema and mild pelvic ascites, but no free intraperitoneal gas. It may be prudent to perform colonoscopy in the next few months, if not recently performed, to establish a baseline and ensure that there is not an underlying sigmoid colon mass. 2. Cystic left ovarian lesion. Sonographic follow up is recommended within the next several weeks, following resolution of the patient's acute symptoms, for further characterization. I am doubtful that this is an abscess given the lack of gas and lack of thick enhancing walls. 3. Small left adrenal adenoma. 4. Gastroesophageal reflux.   Electronically Signed  By: Sherryl Barters M.D.  On: 07/18/2014 13:42  She was started on Cipro and Flagyl in the ED. She was given a RX for percocet but has not been able to take it because it makes her sick on her stomach. Since she has been home, she feels a little better. She still c/o pain in her lower back and LLQ. She feels a lot of pressure in her abdomen. She denies fever, chills, nausea, diarrhea or blood in her stool.  Review of Systems      Past Medical History  Diagnosis Date  . Genital warts   . Chicken pox   . Hay fever   . UTI (lower urinary tract infection)     Current Outpatient Prescriptions  Medication Sig Dispense Refill  . ciprofloxacin (CIPRO) 500 MG tablet Take 1 tablet (500 mg total) by mouth 2 (two) times daily. One po bid x 10 days 20 tablet 0  . metroNIDAZOLE (FLAGYL) 500 MG tablet Take 1 tablet (500 mg total) by mouth 2 (two) times daily. One po bid x 10 days 20 tablet 0  . PROVENTIL HFA 108 (90 BASE) MCG/ACT inhaler Inhale 2 puffs into the lungs daily as  needed for shortness of breath.   0  . pseudoephedrine-acetaminophen (TYLENOL SINUS) 30-500 MG TABS Take 1 tablet by mouth every 4 (four) hours as needed (for cold).     No current facility-administered medications for this visit.    Allergies  Allergen Reactions  . Percocet [Oxycodone-Acetaminophen] Nausea And Vomiting    Family History  Problem Relation Age of Onset  . Arthritis Mother   . Hypertension Mother   . Arthritis Father   . Diabetes Son   . Cancer Maternal Aunt   . Diabetes Paternal Grandmother     History   Social History  . Marital Status: Married    Spouse Name: N/A    Number of Children: N/A  . Years of Education: N/A   Occupational History  . Not on file.   Social History Main Topics  . Smoking status: Current Every Day Smoker -- 0.50 packs/day    Types: Cigarettes  . Smokeless tobacco: Never Used  . Alcohol Use: 0.0 oz/week    0 Not specified per week     Comment: occasional  . Drug Use: No  . Sexual Activity: Yes    Birth Control/ Protection: None   Other Topics Concern  . Not on file   Social History Narrative     Constitutional: Denies fever, malaise, fatigue, headache or abrupt weight changes.  Respiratory: Denies difficulty breathing,  shortness of breath, cough or sputum production.   Cardiovascular: Denies chest pain, chest tightness, palpitations or swelling in the hands or feet.  Gastrointestinal: Pt reports LLQ pain. Denies bloating, constipation, diarrhea or blood in the stool.  GU: Denies urgency, frequency, pain with urination, burning sensation, blood in urine, odor or discharge. Musculoskeletal: Pt reports low back pain. Denies decrease in range of motion, difficulty with gait, or joint pain and swelling.   No other specific complaints in a complete review of systems (except as listed in HPI above).  Objective:   Physical Exam   BP 118/74 mmHg  Pulse 72  Temp(Src) 98 F (36.7 C) (Oral)  Wt 242 lb (109.77 kg)  SpO2  99% Wt Readings from Last 3 Encounters:  07/22/14 242 lb (109.77 kg)  07/18/14 240 lb (108.863 kg)  11/28/13 234 lb 12 oz (106.482 kg)    General: Appears her stated age, obese in NAD. Cardiovascular: Normal rate and rhythm. S1,S2 noted.  No murmur, rubs or gallops noted.  Pulmonary/Chest: Normal effort and positive vesicular breath sounds. No respiratory distress. No wheezes, rales or ronchi noted.  Abdomen: Soft and generally tender. Normal bowel sounds, no bruits noted. No distention or masses noted. Liver, spleen and kidneys non palpable. No CVA tenderness. Musculoskeletal: Normal flexion and extension of the back. No pain with palpation of the spine or para lumbar muscles. Strength 5/5 BLE. No difficulty with gait.    BMET    Component Value Date/Time   NA 140 07/18/2014 0738   K 4.2 07/18/2014 0738   CL 106 07/18/2014 0738   CO2 29 07/18/2014 0738   GLUCOSE 112* 07/18/2014 0738   BUN 9 07/18/2014 0738   CREATININE 0.68 07/18/2014 0738   CALCIUM 8.9 07/18/2014 0738   GFRNONAA >90 07/18/2014 0738   GFRAA >90 07/18/2014 0738    Lipid Panel     Component Value Date/Time   CHOL 197 08/07/2013 1059   TRIG 68.0 08/07/2013 1059   HDL 62.70 08/07/2013 1059   CHOLHDL 3 08/07/2013 1059   VLDL 13.6 08/07/2013 1059   LDLCALC 121* 08/07/2013 1059    CBC    Component Value Date/Time   WBC 10.4 07/18/2014 0738   RBC 4.60 07/18/2014 0738   HGB 12.8 07/18/2014 0738   HCT 40.0 07/18/2014 0738   PLT 169 07/18/2014 0738   MCV 87.0 07/18/2014 0738   MCH 27.8 07/18/2014 0738   MCHC 32.0 07/18/2014 0738   RDW 13.9 07/18/2014 0738   LYMPHSABS 1.4 07/18/2014 0738   MONOABS 0.6 07/18/2014 0738   EOSABS 0.1 07/18/2014 0738   BASOSABS 0.0 07/18/2014 0738    Hgb A1C Lab Results  Component Value Date   HGBA1C 5.8 08/07/2013        Assessment & Plan:   Hospital follow up for diverticulitis, left cystic ovarian mass:  Hospital notes, labs, imaging reviewed with the  pt Advised her to continue the Cipro/Flagyl Stop the percocet Will give RX for Norco (limited quantity only) Diet information given for diverticulitis Will refer to GI for further evaluation and set her up for a colonoscopy Will order Pelvic/Transvaginal ultrasound for 3-4 weeks from now to evaluate left ovarian cyst Work note provided  RTC as needed or if symptoms persist or worsen S

## 2014-07-29 ENCOUNTER — Encounter: Payer: Self-pay | Admitting: Physician Assistant

## 2014-07-29 ENCOUNTER — Ambulatory Visit (INDEPENDENT_AMBULATORY_CARE_PROVIDER_SITE_OTHER): Payer: BLUE CROSS/BLUE SHIELD | Admitting: Physician Assistant

## 2014-07-29 VITALS — BP 114/70 | HR 80 | Ht 64.0 in | Wt 244.0 lb

## 2014-07-29 DIAGNOSIS — K5732 Diverticulitis of large intestine without perforation or abscess without bleeding: Secondary | ICD-10-CM

## 2014-07-29 DIAGNOSIS — Z800 Family history of malignant neoplasm of digestive organs: Secondary | ICD-10-CM

## 2014-07-29 MED ORDER — NA SULFATE-K SULFATE-MG SULF 17.5-3.13-1.6 GM/177ML PO SOLN: 1.0000 | Freq: Once | ORAL | Status: DC

## 2014-07-29 NOTE — Progress Notes (Signed)
Patient ID: Kristina Savage, female   DOB: 11-19-1960, 54 y.o.   MRN: [id number]    HPI:    Kristina Savage is a pleasant 54 year old female referred for evaluation by Cecille Po, NP, due to a recent episode of diverticulitis.  Kristina Savage was in her usual state of good health when around December 23 she began to develop left lower quadrant abdominal pain. Her pain became more severe and so she was seen in the emergency room on December 24 where she had a CT scan of the abdomen and pelvis and was diagnosed with diverticulitis. She was treated with a course of Cipro and Flagyl and was advised to follow-up with GI due to the amount of thickening of the sigmoid wall on CT. Since completing her antibiotics, she feels much better she has minimal pain. She has no nausea or vomiting. She has no fever or chills. She is moving her bowels regularly. She has no bright red blood per rectum or melena. She had not had any episodes of diverticulitis prior to this episode. Prior to the episode of diverticulitis, she had not had any change in her bowel habits or stool caliber, nor had she had any anorexia or unexplained weight loss.   She states her mother had colon polyps removed in her 60s, and her mother's brother had colon cancer in his 35s.   Past Medical History  Diagnosis Date  . Genital warts   . Chicken pox   . Hay fever   . UTI (lower urinary tract infection)     Past Surgical History  Procedure Laterality Date  . Cervix removal      Laser surgery, not complete removal  . Cesarean section     Family History  Problem Relation Age of Onset  . Arthritis Mother   . Hypertension Mother   . Arthritis Father   . Diabetes Son   . Cancer Maternal Aunt   . Diabetes Paternal Grandmother    History  Substance Use Topics  . Smoking status: Current Every Day Smoker -- 0.50 packs/day    Types: Cigarettes  . Smokeless tobacco: Never Used  . Alcohol Use: 0.0 oz/week    0 Not specified per week     Comment:  occasional   Current Outpatient Prescriptions  Medication Sig Dispense Refill  . HYDROcodone-acetaminophen (NORCO) 10-325 MG per tablet Take 1 tablet by mouth every 8 (eight) hours as needed. 20 tablet 0  . PROVENTIL HFA 108 (90 BASE) MCG/ACT inhaler Inhale 2 puffs into the lungs daily as needed for shortness of breath.   0  . pseudoephedrine-acetaminophen (TYLENOL SINUS) 30-500 MG TABS Take 1 tablet by mouth every 4 (four) hours as needed (for cold).    . Na Sulfate-K Sulfate-Mg Sulf SOLN Take 1 Dose by mouth once. 354 mL 0   No current facility-administered medications for this visit.   Allergies  Allergen Reactions  . Percocet [Oxycodone-Acetaminophen] Nausea And Vomiting     Review of Systems: Gen: Denies any fever, chills, sweats, anorexia, fatigue, weakness, malaise, weight loss, and sleep disorder CV: Denies chest pain, angina, palpitations, syncope, orthopnea, PND, peripheral edema, and claudication. Resp: Denies dyspnea at rest, dyspnea with exercise, cough, sputum, wheezing, coughing up blood, and pleurisy. GI: Denies vomiting blood, jaundice, and fecal incontinence.   Denies dysphagia or odynophagia. GU : Denies urinary burning, blood in urine, urinary frequency, urinary hesitancy, nocturnal urination, and urinary incontinence. MS: Denies joint pain, limitation of movement, and swelling, stiffness, low back pain, extremity pain.  Denies muscle weakness, cramps, atrophy.  Derm: Denies rash, itching, dry skin, hives, moles, warts, or unhealing ulcers.  Psych: Denies depression, anxiety, memory loss, suicidal ideation, hallucinations, paranoia, and confusion. Heme: Denies bruising, bleeding, and enlarged lymph nodes. Neuro:  Denies any headaches, dizziness, paresthesias. Endo:  Denies any problems with DM, thyroid, adrenal function  Studies: Ct Abdomen Pelvis W Contrast  07/18/2014   CLINICAL DATA:  Lower abdominal pain for 1 day.  EXAM: CT ABDOMEN AND PELVIS WITH CONTRAST   TECHNIQUE: Multidetector CT imaging of the abdomen and pelvis was performed using the standard protocol following bolus administration of intravenous contrast.  CONTRAST:  161mL OMNIPAQUE IOHEXOL 300 MG/ML  SOLN  COMPARISON:  None.  FINDINGS: Lower chest: Subsegmental atelectasis, posterior basal segment left lower lobe. Contrast medium in the distal esophagus suspicious for gastroesophageal reflux.  Hepatobiliary: Unremarkable  Pancreas: Unremarkable  Spleen: Unremarkable  Adrenals/Urinary Tract: Fullness of both adrenal glands. 1.3 by 2.2 cm left adrenal mass, relative washout 50%, compatible with adenoma. 2.5 by 2.2 cm left kidney upper pole Bosniak category 1 cyst. 2.3 by 1.3 cm intraparenchymal cyst in the right mid kidney appears benign. No renal or ureteral calculi ; a calcification adjacent to the left distal ureter is shown to be below/outside the ureter on image 56 of series 5.  Stomach/Bowel: Prominent wall thickening and irregularity along a 7 cm region of proximal sigmoid colon with surrounding mesenteric stranding and underlying diverticulosis, favoring active diverticulitis there is some adjacent free fluid by the cecum and tracking in the pelvis. No free intraperitoneal gas observed.  Vascular/Lymphatic: Unremarkable  Reproductive: Along the posterior margin of the left ovary, a 6.5 by 5.1 cm cystic lesion is present. I favor this as being an ovarian or adnexal cyst rather than an abscess related to the nearby diverticulitis. Some of the stranding from the diverticulitis does extend adjacent to this cystic lesion. There is no internal gas. The lesions is not appears thick walled as would typically be the case for an abscess. Uterus unremarkable.  Other: Small amount of free fluid in the pelvis as noted above.  Musculoskeletal: Unremarkable  IMPRESSION: 1. Sigmoid diverticulitis with extensive mesenteric edema and mild pelvic ascites, but no free intraperitoneal gas. It may be prudent to perform  colonoscopy in the next few months, if not recently performed, to establish a baseline and ensure that there is not an underlying sigmoid colon mass. 2. Cystic left ovarian lesion. Sonographic follow up is recommended within the next several weeks, following resolution of the patient's acute symptoms, for further characterization. I am doubtful that this is an abscess given the lack of gas and lack of thick enhancing walls. 3. Small left adrenal adenoma. 4. Gastroesophageal reflux.   Electronically Signed   By: Sherryl Barters M.D.   On: 07/18/2014 13:42    LAB RESULTS: CBC 07/18/2014 had a white blood count 10.4, hemoglobin 12.8, hematocrit 40, platelets 169,000.   Physical Exam: BP 114/70 mmHg  Pulse 80  Ht 5\' 4"  (1.626 m)  Wt 244 lb (110.678 kg)  BMI 41.86 kg/m2 Constitutional: Pleasant,well-developed female in no acute distress. HEENT: Normocephalic and atraumatic. Conjunctivae are normal. No scleral icterus. Neck supple.  Cardiovascular: Normal rate, regular rhythm.  Pulmonary/chest: Effort normal and breath sounds normal. No wheezing, rales or rhonchi. Abdominal: Soft, nondistended, nontender. Bowel sounds active throughout. There are no masses palpable. No hepatomegaly. Extremities: no edema Lymphadenopathy: No cervical adenopathy noted. Neurological: Alert and oriented to person place and time. Skin: Skin is  warm and dry. No rashes noted. Psychiatric: Normal mood and affect. Behavior is normal.  ASSESSMENT AND PLAN: 54 year old female status post a recent bout of diverticulitis referred for evaluation. She has been advised to adhere to a high-fiber, low-fat diet and to avoid becoming constipated. She was advised to use milk of magnesia at bedtime as needed if she skips more than 2 or 3 days without a bowel movement. She will be scheduled for a colonoscopy to screen for polyps, neoplasia, or inflammatory process. We will wait 3 weeks or so before scheduling this.The risks, benefits,  and alternatives to colonoscopy with possible biopsy and possible polypectomy were discussed with the patient and they consent to proceed. The procedure will be scheduled with Dr. Deatra Ina. Further recommendations will be made pending the findings of her colonoscopy.    Kristina Savage, Vita Barley PA-C 07/29/2014, 9:40 AM

## 2014-07-29 NOTE — Patient Instructions (Signed)
You have been scheduled for a colonoscopy. Please follow written instructions given to you at your visit today.  Please pick up your prep kit at the pharmacy within the next 1-3 days. If you use inhalers (even only as needed), please bring them with you on the day of your procedure.  High-Fiber Diet Fiber is found in fruits, vegetables, and grains. A high-fiber diet encourages the addition of more whole grains, legumes, fruits, and vegetables in your diet. The recommended amount of fiber for adult males is 38 g per day. For adult females, it is 25 g per day. Pregnant and lactating women should get 28 g of fiber per day. If you have a digestive or bowel problem, ask your caregiver for advice before adding high-fiber foods to your diet. Eat a variety of high-fiber foods instead of only a select few type of foods.  PURPOSE  To increase stool bulk.  To make bowel movements more regular to prevent constipation.  To lower cholesterol.  To prevent overeating. WHEN IS THIS DIET USED?  It may be used if you have constipation and hemorrhoids.  It may be used if you have uncomplicated diverticulosis (intestine condition) and irritable bowel syndrome.  It may be used if you need help with weight management.  It may be used if you want to add it to your diet as a protective measure against atherosclerosis, diabetes, and cancer. SOURCES OF FIBER  Whole-grain breads and cereals.  Fruits, such as apples, oranges, bananas, berries, prunes, and pears.  Vegetables, such as green peas, carrots, sweet potatoes, beets, broccoli, cabbage, spinach, and artichokes.  Legumes, such split peas, soy, lentils.  Almonds. FIBER CONTENT IN FOODS Starches and Grains / Dietary Fiber (g)  Cheerios, 1 cup / 3 g  Corn Flakes cereal, 1 cup / 0.7 g  Rice crispy treat cereal, 1 cup / 0.3 g  Instant oatmeal (cooked),  cup / 2 g  Frosted wheat cereal, 1 cup / 5.1 g  Brown, long-grain rice (cooked), 1 cup /  3.5 g  White, long-grain rice (cooked), 1 cup / 0.6 g  Enriched macaroni (cooked), 1 cup / 2.5 g Legumes / Dietary Fiber (g)  Baked beans (canned, plain, or vegetarian),  cup / 5.2 g  Kidney beans (canned),  cup / 6.8 g  Pinto beans (cooked),  cup / 5.5 g Breads and Crackers / Dietary Fiber (g)  Plain or honey graham crackers, 2 squares / 0.7 g  Saltine crackers, 3 squares / 0.3 g  Plain, salted pretzels, 10 pieces / 1.8 g  Whole-wheat bread, 1 slice / 1.9 g  White bread, 1 slice / 0.7 g  Raisin bread, 1 slice / 1.2 g  Plain bagel, 3 oz / 2 g  Flour tortilla, 1 oz / 0.9 g  Corn tortilla, 1 small / 1.5 g  Hamburger or hotdog bun, 1 small / 0.9 g Fruits / Dietary Fiber (g)  Apple with skin, 1 medium / 4.4 g  Sweetened applesauce,  cup / 1.5 g  Banana,  medium / 1.5 g  Grapes, 10 grapes / 0.4 g  Orange, 1 small / 2.3 g  Raisin, 1.5 oz / 1.6 g  Melon, 1 cup / 1.4 g Vegetables / Dietary Fiber (g)  Green beans (canned),  cup / 1.3 g  Carrots (cooked),  cup / 2.3 g  Broccoli (cooked),  cup / 2.8 g  Peas (cooked),  cup / 4.4 g  Mashed potatoes,  cup / 1.6 g  Lettuce, 1 cup / 0.5 g  Corn (canned),  cup / 1.6 g  Tomato,  cup / 1.1 g Document Released: 07/12/2005 Document Revised: 01/11/2012 Document Reviewed: 10/14/2011 [facility] Patient Information 2015 [place], [place]. This information is not intended to replace advice given to you by your health care provider. Make sure you discuss any questions you have with your health care provider.  Fat and Cholesterol Control Diet Fat and cholesterol levels in your blood and organs are influenced by your diet. High levels of fat and cholesterol may lead to diseases of the heart, small and large blood vessels, gallbladder, liver, and pancreas. CONTROLLING FAT AND CHOLESTEROL WITH DIET Although exercise and lifestyle factors are important, your diet is key. That is because certain foods are known to raise  cholesterol and others to lower it. The goal is to balance foods for their effect on cholesterol and more importantly, to replace saturated and trans fat with other types of fat, such as monounsaturated fat, polyunsaturated fat, and omega-3 fatty acids. On average, a person should consume no more than 15 to 17 g of saturated fat daily. Saturated and trans fats are considered "bad" fats, and they will raise LDL cholesterol. Saturated fats are primarily found in animal products such as meats, butter, and cream. However, that does not mean you need to give up all your favorite foods. Today, there are good tasting, low-fat, low-cholesterol substitutes for most of the things you like to eat. Choose low-fat or nonfat alternatives. Choose round or loin cuts of red meat. These types of cuts are lowest in fat and cholesterol. Chicken (without the skin), fish, veal, and ground [place] breast are great choices. Eliminate fatty meats, such as hot dogs and salami. Even shellfish have little or no saturated fat. Have a 3 oz (85 g) portion when you eat lean meat, poultry, or fish. Trans fats are also called "partially hydrogenated oils." They are oils that have been scientifically manipulated so that they are solid at room temperature resulting in a longer shelf life and improved taste and texture of foods in which they are added. Trans fats are found in stick margarine, some tub margarines, cookies, crackers, and baked goods.  When baking and cooking, oils are a great substitute for butter. The monounsaturated oils are especially beneficial since it is believed they lower LDL and raise HDL. The oils you should avoid entirely are saturated tropical oils, such as coconut and palm.  Remember to eat a lot from food groups that are naturally free of saturated and trans fat, including fish, fruit, vegetables, beans, grains (barley, rice, couscous, bulgur wheat), and pasta (without cream sauces).  IDENTIFYING FOODS THAT LOWER FAT  AND CHOLESTEROL  Soluble fiber may lower your cholesterol. This type of fiber is found in fruits such as apples, vegetables such as broccoli, potatoes, and carrots, legumes such as beans, peas, and lentils, and grains such as barley. Foods fortified with plant sterols (phytosterol) may also lower cholesterol. You should eat at least 2 g per day of these foods for a cholesterol lowering effect.  Read package labels to identify low-saturated fats, trans fat free, and low-fat foods at the supermarket. Select cheeses that have only 2 to 3 g saturated fat per ounce. Use a heart-healthy tub margarine that is free of trans fats or partially hydrogenated oil. When buying baked goods (cookies, crackers), avoid partially hydrogenated oils. Breads and muffins should be made from whole grains (whole-wheat or whole oat flour, instead of "flour" or "enriched  flour"). Buy non-creamy canned soups with reduced salt and no added fats.  FOOD PREPARATION TECHNIQUES  Never deep-fry. If you must fry, either stir-fry, which uses very little fat, or use non-stick cooking sprays. When possible, broil, bake, or roast meats, and steam vegetables. Instead of putting butter or margarine on vegetables, use lemon and herbs, applesauce, and cinnamon (for squash and sweet potatoes). Use nonfat yogurt, salsa, and low-fat dressings for salads.  LOW-SATURATED FAT / LOW-FAT FOOD SUBSTITUTES Meats / Saturated Fat (g)  Avoid: Steak, marbled (3 oz/85 g) / 11 g  Choose: Steak, lean (3 oz/85 g) / 4 g  Avoid: Hamburger (3 oz/85 g) / 7 g  Choose: Hamburger, lean (3 oz/85 g) / 5 g  Avoid: Ham (3 oz/85 g) / 6 g  Choose: Ham, lean cut (3 oz/85 g) / 2.4 g  Avoid: Chicken, with skin, dark meat (3 oz/85 g) / 4 g  Choose: Chicken, skin removed, dark meat (3 oz/85 g) / 2 g  Avoid: Chicken, with skin, light meat (3 oz/85 g) / 2.5 g  Choose: Chicken, skin removed, light meat (3 oz/85 g) / 1 g Dairy / Saturated Fat (g)  Avoid: Whole milk (1  cup) / 5 g  Choose: Low-fat milk, 2% (1 cup) / 3 g  Choose: Low-fat milk, 1% (1 cup) / 1.5 g  Choose: Skim milk (1 cup) / 0.3 g  Avoid: Hard cheese (1 oz/28 g) / 6 g  Choose: Skim milk cheese (1 oz/28 g) / 2 to 3 g  Avoid: Cottage cheese, 4% fat (1 cup) / 6.5 g  Choose: Low-fat cottage cheese, 1% fat (1 cup) / 1.5 g  Avoid: Ice cream (1 cup) / 9 g  Choose: Sherbet (1 cup) / 2.5 g  Choose: Nonfat frozen yogurt (1 cup) / 0.3 g  Choose: Frozen fruit bar / trace  Avoid: Whipped cream (1 tbs) / 3.5 g  Choose: Nondairy whipped topping (1 tbs) / 1 g Condiments / Saturated Fat (g)  Avoid: Mayonnaise (1 tbs) / 2 g  Choose: Low-fat mayonnaise (1 tbs) / 1 g  Avoid: Butter (1 tbs) / 7 g  Choose: Extra light margarine (1 tbs) / 1 g  Avoid: Coconut oil (1 tbs) / 11.8 g  Choose: Olive oil (1 tbs) / 1.8 g  Choose: Corn oil (1 tbs) / 1.7 g  Choose: Safflower oil (1 tbs) / 1.2 g  Choose: Sunflower oil (1 tbs) / 1.4 g  Choose: Soybean oil (1 tbs) / 2.4 g  Choose: Canola oil (1 tbs) / 1 g Document Released: 07/12/2005 Document Revised: 11/06/2012 Document Reviewed: 10/10/2013 [employer] Patient Information 2015 [place], [place]. This information is not intended to replace advice given to you by your health care provider. Make sure you discuss any questions you have with your health care provider.

## 2014-07-30 NOTE — Progress Notes (Signed)
Reviewed and agree with management. Robert D. Kaplan, M.D., FACG  

## 2014-07-31 ENCOUNTER — Ambulatory Visit
Admission: RE | Admit: 2014-07-31 | Discharge: 2014-07-31 | Disposition: A | Discharge status: Home / Self Care | Payer: BLUE CROSS/BLUE SHIELD | Source: Ambulatory Visit | Attending: Internal Medicine | Admitting: Internal Medicine

## 2014-07-31 DIAGNOSIS — N83202 Unspecified ovarian cyst, left side: Secondary | ICD-10-CM

## 2014-08-02 NOTE — Progress Notes (Signed)
If she is concerned about it bursting, we can refer her to gyn for further evaluation and treatment. There is nothing I will be able to do for her here.

## 2014-08-06 ENCOUNTER — Encounter: Payer: Self-pay | Admitting: Gastroenterology

## 2014-08-13 ENCOUNTER — Ambulatory Visit (INDEPENDENT_AMBULATORY_CARE_PROVIDER_SITE_OTHER): Payer: BLUE CROSS/BLUE SHIELD | Admitting: Family Medicine

## 2014-08-13 ENCOUNTER — Encounter: Payer: Self-pay | Admitting: Family Medicine

## 2014-08-13 VITALS — BP 124/68 | HR 84 | Temp 97.3°F | Wt 244.0 lb

## 2014-08-13 DIAGNOSIS — K5732 Diverticulitis of large intestine without perforation or abscess without bleeding: Secondary | ICD-10-CM

## 2014-08-13 DIAGNOSIS — N83202 Unspecified ovarian cyst, left side: Secondary | ICD-10-CM

## 2014-08-13 DIAGNOSIS — N8320 Unspecified ovarian cysts: Secondary | ICD-10-CM

## 2014-08-13 DIAGNOSIS — N83209 Unspecified ovarian cyst, unspecified side: Secondary | ICD-10-CM | POA: Insufficient documentation

## 2014-08-13 DIAGNOSIS — R1032 Left lower quadrant pain: Secondary | ICD-10-CM

## 2014-08-13 MED ORDER — AMOXICILLIN-POT CLAVULANATE 875-125 MG PO TABS: 1.0000 | ORAL_TABLET | Freq: Two times a day (BID) | ORAL | Status: DC

## 2014-08-13 NOTE — Assessment & Plan Note (Addendum)
Improved but remains persistent and is quite tender on exam with some guarding.  She is concerned this is related to her ovarian cyst- I cannot completely rule this out- will refer to GYN- did discuss [place] results with her again today and the benign appearance of the cyst. Also cannot rule out diverticultis either- will place on 10 day course of Augmentin. I did decline refilling her Norco- explaining to her that this may only worsen her GI symptoms (ie may cause obstruction or constipation). The patient indicates understanding of these issues and agrees with the plan.

## 2014-08-13 NOTE — Progress Notes (Signed)
Subjective:   Patient ID: Kristina Savage, female    DOB: [birth year], 54 y.o.   MRN: [id number]  Kristina Savage is a pleasant 54 y.o. year old female who presents to clinic today with her husband for Follow-up  on 08/13/2014  HPI: Kristina Savage to ER on 07/18/14 for severe left lower quadrant pain with nausea. No fever, change in bowel habits, vomiting or blood in her stool.  Notes reviewed. Ct showed diverticulitis and ovarian cystic lesion.  Was treated with 10 day course of cipro and flagyl and followed up with Kristina Savage on 12/28.  She gave her rx for Norco, referred to GI and ordered a pelvic ultrasound which showed appearance of benign left ovarian cyst- recommended repeat ultrasound in 1 year.  US Transvaginal Non-ob  07/31/2014   CLINICAL DATA:  Followup ovarian cyst  EXAM: TRANSABDOMINAL AND TRANSVAGINAL ULTRASOUND OF PELVIS  TECHNIQUE: Both transabdominal and transvaginal ultrasound examinations of the pelvis were performed. Transabdominal technique was performed for global imaging of the pelvis including uterus, ovaries, adnexal regions, and pelvic cul-de-sac. It was necessary to proceed with endovaginal exam following the transabdominal exam to visualize the endometrium and ovaries.  COMPARISON:  None  FINDINGS: Uterus  Measurements: 8.7 x 4.1 x 4.8 cm. No fibroids or other mass visualized.  Endometrium  Thickness: 6.1 mm.  No focal abnormality visualized.  Right ovary  Measurements: 2.3 x 1.6 x 1.4 cm. Normal appearance/no adnexal mass.  Left ovary  Measurements: 2.3 x 1.5 x 1.9 cm. Cyst within the left ovary measures 5.7 x 5.0 x 5.5 cm. This appears anechoic. No mural nodule or internal septation.  Other findings  No free fluid.  IMPRESSION: 1. Left ovarian cyst. This is almost certainly benign, but follow up ultrasound is recommended in 1 year according to the Society of Radiologists in [place] Statement (D Clovis Riley et al. Management of Asymptomatic Ovarian and Other  Adnexal Cysts Imaged at [place]: Society of Radiologists in [place] Statement 2010. Radiology 256 (Sept 2010): 353-614.).   Electronically Signed   By: Kristina Savage M.D.   On: 07/31/2014 13:54   US Pelvis Complete  07/31/2014   CLINICAL DATA:  Followup ovarian cyst  EXAM: TRANSABDOMINAL AND TRANSVAGINAL ULTRASOUND OF PELVIS  TECHNIQUE: Both transabdominal and transvaginal ultrasound examinations of the pelvis were performed. Transabdominal technique was performed for global imaging of the pelvis including uterus, ovaries, adnexal regions, and pelvic cul-de-sac. It was necessary to proceed with endovaginal exam following the transabdominal exam to visualize the endometrium and ovaries.  COMPARISON:  None  FINDINGS: Uterus  Measurements: 8.7 x 4.1 x 4.8 cm. No fibroids or other mass visualized.  Endometrium  Thickness: 6.1 mm.  No focal abnormality visualized.  Right ovary  Measurements: 2.3 x 1.6 x 1.4 cm. Normal appearance/no adnexal mass.  Left ovary  Measurements: 2.3 x 1.5 x 1.9 cm. Cyst within the left ovary measures 5.7 x 5.0 x 5.5 cm. This appears anechoic. No mural nodule or internal septation.  Other findings  No free fluid.  IMPRESSION: 1. Left ovarian cyst. This is almost certainly benign, but follow up ultrasound is recommended in 1 year according to the Society of Radiologists in [place] Statement (D Clovis Riley et al. Management of Asymptomatic Ovarian and Other Adnexal Cysts Imaged at [place]: Society of Radiologists in [place] Statement 2010. Radiology 256 (Sept 2010): 431-540.).   Electronically Signed   By: Kristina Savage M.D.   On: 07/31/2014 13:54  Ct Abdomen Pelvis W Contrast  07/18/2014   CLINICAL DATA:  Lower abdominal pain for 1 day.  EXAM: CT ABDOMEN AND PELVIS WITH CONTRAST  TECHNIQUE: Multidetector CT imaging of the abdomen and pelvis was performed using the standard protocol following bolus administration of intravenous  contrast.  CONTRAST:  110mL OMNIPAQUE IOHEXOL 300 MG/ML  SOLN  COMPARISON:  None.  FINDINGS: Lower chest: Subsegmental atelectasis, posterior basal segment left lower lobe. Contrast medium in the distal esophagus suspicious for gastroesophageal reflux.  Hepatobiliary: Unremarkable  Pancreas: Unremarkable  Spleen: Unremarkable  Adrenals/Urinary Tract: Fullness of both adrenal glands. 1.3 by 2.2 cm left adrenal mass, relative washout 50%, compatible with adenoma. 2.5 by 2.2 cm left kidney upper pole Bosniak category 1 cyst. 2.3 by 1.3 cm intraparenchymal cyst in the right mid kidney appears benign. No renal or ureteral calculi ; a calcification adjacent to the left distal ureter is shown to be below/outside the ureter on image 56 of series 5.  Stomach/Bowel: Prominent wall thickening and irregularity along a 7 cm region of proximal sigmoid colon with surrounding mesenteric stranding and underlying diverticulosis, favoring active diverticulitis there is some adjacent free fluid by the cecum and tracking in the pelvis. No free intraperitoneal gas observed.  Vascular/Lymphatic: Unremarkable  Reproductive: Along the posterior margin of the left ovary, a 6.5 by 5.1 cm cystic lesion is present. I favor this as being an ovarian or adnexal cyst rather than an abscess related to the nearby diverticulitis. Some of the stranding from the diverticulitis does extend adjacent to this cystic lesion. There is no internal gas. The lesions is not appears thick walled as would typically be the case for an abscess. Uterus unremarkable.  Other: Small amount of free fluid in the pelvis as noted above.  Musculoskeletal: Unremarkable  IMPRESSION: 1. Sigmoid diverticulitis with extensive mesenteric edema and mild pelvic ascites, but no free intraperitoneal gas. It may be prudent to perform colonoscopy in the next few months, if not recently performed, to establish a baseline and ensure that there is not an underlying sigmoid colon mass. 2.  Cystic left ovarian lesion. Sonographic follow up is recommended within the next several weeks, following resolution of the patient's acute symptoms, for further characterization. I am doubtful that this is an abscess given the lack of gas and lack of thick enhancing walls. 3. Small left adrenal adenoma. 4. Gastroesophageal reflux.   Electronically Signed   By: Sherryl Barters M.D.   On: 07/18/2014 13:42   Colonoscopy scheduled for 09/14/14. She is here today because while pain has improved, it is persistent.  Feels she needs more norco. Still not having change in her bowel habits or blood in her stool.  Pain is worse when she walks.  Current Outpatient Prescriptions on File Prior to Visit  Medication Sig Dispense Refill  . HYDROcodone-acetaminophen (NORCO) 10-325 MG per tablet Take 1 tablet by mouth every 8 (eight) hours as needed. 20 tablet 0  . PROVENTIL HFA 108 (90 BASE) MCG/ACT inhaler Inhale 2 puffs into the lungs daily as needed for shortness of breath.   0  . pseudoephedrine-acetaminophen (TYLENOL SINUS) 30-500 MG TABS Take 1 tablet by mouth every 4 (four) hours as needed (for cold).     No current facility-administered medications on file prior to visit.    Allergies  Allergen Reactions  . Percocet [Oxycodone-Acetaminophen] Nausea And Vomiting    Past Medical History  Diagnosis Date  . Genital warts   . Chicken pox   . Hay fever   .  UTI (lower urinary tract infection)     Past Surgical History  Procedure Laterality Date  . Cervix removal      Laser surgery, not complete removal  . Cesarean section      Family History  Problem Relation Age of Onset  . Arthritis Mother   . Hypertension Mother   . Arthritis Father   . Diabetes Son   . Cancer Maternal Aunt   . Diabetes Paternal Grandmother     History   Social History  . Marital Status: Married    Spouse Name: N/A    Number of Children: 4  . Years of Education: N/A   Occupational History  . CSR    Social  History Main Topics  . Smoking status: Current Every Day Smoker -- 0.50 packs/day    Types: Cigarettes  . Smokeless tobacco: Never Used  . Alcohol Use: 0.0 oz/week    0 Not specified per week     Comment: occasional  . Drug Use: No  . Sexual Activity: Yes    Birth Control/ Protection: None   Other Topics Concern  . Not on file   Social History Narrative   The PMH, PSH, Social History, Family History, Medications, and allergies have been reviewed in [facility], and have been updated if relevant.   Review of Systems  Constitutional: Negative.   HENT: Negative.   Eyes: Negative.   Respiratory: Negative.   Cardiovascular: Negative.   Gastrointestinal: Positive for nausea and abdominal pain. Negative for vomiting, diarrhea, constipation, blood in stool, abdominal distention and rectal pain.  Genitourinary: Negative for vaginal bleeding, vaginal discharge and vaginal pain.  Skin: Negative.   Allergic/Immunologic: Negative.   Neurological: Negative.   Hematological: Negative.   Psychiatric/Behavioral: Negative.   All other systems reviewed and are negative.      Objective:    BP 124/68 mmHg  Pulse 84  Temp(Src) 97.3 F (36.3 C) (Oral)  Wt 244 lb (110.678 kg)  SpO2 97%   Physical Exam  Constitutional: She is oriented to person, place, and time. She appears well-developed and well-nourished. No distress.  HENT:  Head: Normocephalic.  Eyes: Conjunctivae are normal.  Cardiovascular: Normal rate.   Pulmonary/Chest: Effort normal.  Abdominal: Soft. Normal appearance and bowel sounds are normal. There is no hepatosplenomegaly or hepatomegaly. There is tenderness in the suprapubic area and left lower quadrant. There is guarding. There is no rigidity, no rebound, no CVA tenderness, no tenderness at McBurney's point and negative Murphy's sign.  Musculoskeletal: Normal range of motion.  Neurological: She is alert and oriented to person, place, and time. No cranial nerve deficit.  Skin:  Skin is warm and dry.  Psychiatric: She has a normal mood and affect. Her behavior is normal. Judgment and thought content normal.  Nursing note and vitals reviewed.         Assessment & Plan:   Diverticulitis of colon  Cyst of left ovary - Plan: Ambulatory referral to Gynecology  Abdominal pain, left lower quadrant No Follow-up on file.

## 2014-08-13 NOTE — Progress Notes (Signed)
Pre visit review using our clinic review tool, if applicable. No additional management support is needed unless otherwise documented below in the visit note. 

## 2014-08-13 NOTE — Patient Instructions (Signed)
Good to see you. Please take Augmentin as directed- 1 tablet twice daily for 10 days. Please stop by to see Rosaria Ferries on your way out to set up your gynecology referral.

## 2014-08-20 ENCOUNTER — Other Ambulatory Visit: Payer: Self-pay | Admitting: Internal Medicine

## 2014-08-20 ENCOUNTER — Other Ambulatory Visit: Payer: Self-pay

## 2014-08-20 ENCOUNTER — Other Ambulatory Visit (HOSPITAL_COMMUNITY)
Admission: RE | Admit: 2014-08-20 | Payer: BLUE CROSS/BLUE SHIELD | Source: Ambulatory Visit | Admitting: Obstetrics and Gynecology

## 2014-08-20 ENCOUNTER — Encounter: Payer: Self-pay | Admitting: Obstetrics and Gynecology

## 2014-08-20 ENCOUNTER — Ambulatory Visit (INDEPENDENT_AMBULATORY_CARE_PROVIDER_SITE_OTHER): Payer: BLUE CROSS/BLUE SHIELD | Admitting: Obstetrics and Gynecology

## 2014-08-20 ENCOUNTER — Other Ambulatory Visit: Payer: Self-pay | Admitting: Obstetrics and Gynecology

## 2014-08-20 VITALS — BP 113/86 | HR 74 | Ht 64.0 in | Wt 243.0 lb

## 2014-08-20 DIAGNOSIS — Z1151 Encounter for screening for human papillomavirus (HPV): Secondary | ICD-10-CM

## 2014-08-20 DIAGNOSIS — Z124 Encounter for screening for malignant neoplasm of cervix: Secondary | ICD-10-CM

## 2014-08-20 DIAGNOSIS — N8320 Unspecified ovarian cysts: Secondary | ICD-10-CM | POA: Diagnosis not present

## 2014-08-20 DIAGNOSIS — N83202 Unspecified ovarian cyst, left side: Secondary | ICD-10-CM

## 2014-08-20 DIAGNOSIS — K5732 Diverticulitis of large intestine without perforation or abscess without bleeding: Secondary | ICD-10-CM

## 2014-08-20 DIAGNOSIS — Z01419 Encounter for gynecological examination (general) (routine) without abnormal findings: Secondary | ICD-10-CM | POA: Diagnosis not present

## 2014-08-20 DIAGNOSIS — Z1231 Encounter for screening mammogram for malignant neoplasm of breast: Secondary | ICD-10-CM

## 2014-08-20 NOTE — Patient Instructions (Signed)
Preventive Care for Adults A healthy lifestyle and preventive care can promote health and wellness. Preventive health guidelines for women include the following key practices.  A routine yearly physical is a good way to check with your health care provider about your health and preventive screening. It is a chance to share any concerns and updates on your health and to receive a thorough exam.  Visit your dentist for a routine exam and preventive care every 6 months. Brush your teeth twice a day and floss once a day. Good oral hygiene prevents tooth decay and gum disease.  The frequency of eye exams is based on your age, health, family medical history, use of contact lenses, and other factors. Follow your health care provider's recommendations for frequency of eye exams.  Eat a healthy diet. Foods like vegetables, fruits, whole grains, low-fat dairy products, and lean protein foods contain the nutrients you need without too many calories. Decrease your intake of foods high in solid fats, added sugars, and salt. Eat the right amount of calories for you.Get information about a proper diet from your health care provider, if necessary.  Regular physical exercise is one of the most important things you can do for your health. Most adults should get at least 150 minutes of moderate-intensity exercise (any activity that increases your heart rate and causes you to sweat) each week. In addition, most adults need muscle-strengthening exercises on 2 or more days a week.  Maintain a healthy weight. The body mass index (BMI) is a screening tool to identify possible weight problems. It provides an estimate of body fat based on height and weight. Your health care provider can find your BMI and can help you achieve or maintain a healthy weight.For adults 20 years and older:  A BMI below 18.5 is considered underweight.  A BMI of 18.5 to 24.9 is normal.  A BMI of 25 to 29.9 is considered overweight.  A BMI of  30 and above is considered obese.  Maintain normal blood lipids and cholesterol levels by exercising and minimizing your intake of saturated fat. Eat a balanced diet with plenty of fruit and vegetables. Blood tests for lipids and cholesterol should begin at age 20 and be repeated every 5 years. If your lipid or cholesterol levels are high, you are over 50, or you are at high risk for heart disease, you may need your cholesterol levels checked more frequently.Ongoing high lipid and cholesterol levels should be treated with medicines if diet and exercise are not working.  If you smoke, find out from your health care provider how to quit. If you do not use tobacco, do not start.  Lung cancer screening is recommended for adults aged 55-80 years who are at high risk for developing lung cancer because of a history of smoking. A yearly low-dose CT scan of the lungs is recommended for people who have at least a 30-pack-year history of smoking and are a current smoker or have quit within the past 15 years. A pack year of smoking is smoking an average of 1 pack of cigarettes a day for 1 year (for example: 1 pack a day for 30 years or 2 packs a day for 15 years). Yearly screening should continue until the smoker has stopped smoking for at least 15 years. Yearly screening should be stopped for people who develop a health problem that would prevent them from having lung cancer treatment.  If you are pregnant, do not drink alcohol. If you are breastfeeding,   be very cautious about drinking alcohol. If you are not pregnant and choose to drink alcohol, do not have more than 1 drink per day. One drink is considered to be 12 ounces (355 mL) of beer, 5 ounces (148 mL) of wine, or 1.5 ounces (44 mL) of liquor.  Avoid use of street drugs. Do not share needles with anyone. Ask for help if you need support or instructions about stopping the use of drugs.  High blood pressure causes heart disease and increases the risk of  stroke. Your blood pressure should be checked at least every 1 to 2 years. Ongoing high blood pressure should be treated with medicines if weight loss and exercise do not work.  If you are 75-52 years old, ask your health care provider if you should take aspirin to prevent strokes.  Diabetes screening involves taking a blood sample to check your fasting blood sugar level. This should be done once every 3 years, after age 15, if you are within normal weight and without risk factors for diabetes. Testing should be considered at a younger age or be carried out more frequently if you are overweight and have at least 1 risk factor for diabetes.  Breast cancer screening is essential preventive care for women. You should practice "breast self-awareness." This means understanding the normal appearance and feel of your breasts and may include breast self-examination. Any changes detected, no matter how small, should be reported to a health care provider. Women in their 58s and 30s should have a clinical breast exam (CBE) by a health care provider as part of a regular health exam every 1 to 3 years. After age 16, women should have a CBE every year. Starting at age 53, women should consider having a mammogram (breast X-ray test) every year. Women who have a family history of breast cancer should talk to their health care provider about genetic screening. Women at a high risk of breast cancer should talk to their health care providers about having an MRI and a mammogram every year.  Breast cancer gene (BRCA)-related cancer risk assessment is recommended for women who have family members with BRCA-related cancers. BRCA-related cancers include breast, ovarian, tubal, and peritoneal cancers. Having family members with these cancers may be associated with an increased risk for harmful changes (mutations) in the breast cancer genes BRCA1 and BRCA2. Results of the assessment will determine the need for genetic counseling and  BRCA1 and BRCA2 testing.  Routine pelvic exams to screen for cancer are no longer recommended for nonpregnant women who are considered low risk for cancer of the pelvic organs (ovaries, uterus, and vagina) and who do not have symptoms. Ask your health care provider if a screening pelvic exam is right for you.  If you have had past treatment for cervical cancer or a condition that could lead to cancer, you need Pap tests and screening for cancer for at least 20 years after your treatment. If Pap tests have been discontinued, your risk factors (such as having a new sexual partner) need to be reassessed to determine if screening should be resumed. Some women have medical problems that increase the chance of getting cervical cancer. In these cases, your health care provider may recommend more frequent screening and Pap tests.  The HPV test is an additional test that may be used for cervical cancer screening. The HPV test looks for the virus that can cause the cell changes on the cervix. The cells collected during the Pap test can be  tested for HPV. The HPV test could be used to screen women aged 30 years and older, and should be used in women of any age who have unclear Pap test results. After the age of 30, women should have HPV testing at the same frequency as a Pap test.  Colorectal cancer can be detected and often prevented. Most routine colorectal cancer screening begins at the age of 50 years and continues through age 75 years. However, your health care provider may recommend screening at an earlier age if you have risk factors for colon cancer. On a yearly basis, your health care provider may provide home test kits to check for hidden blood in the stool. Use of a small camera at the end of a tube, to directly examine the colon (sigmoidoscopy or colonoscopy), can detect the earliest forms of colorectal cancer. Talk to your health care provider about this at age 50, when routine screening begins. Direct  exam of the colon should be repeated every 5-10 years through age 75 years, unless early forms of pre-cancerous polyps or small growths are found.  People who are at an increased risk for hepatitis B should be screened for this virus. You are considered at high risk for hepatitis B if:  You were born in a country where hepatitis B occurs often. Talk with your health care provider about which countries are considered high risk.  Your parents were born in a high-risk country and you have not received a shot to protect against hepatitis B (hepatitis B vaccine).  You have HIV or AIDS.  You use needles to inject street drugs.  You live with, or have sex with, someone who has hepatitis B.  You get hemodialysis treatment.  You take certain medicines for conditions like cancer, organ transplantation, and autoimmune conditions.  Hepatitis C blood testing is recommended for all people born from 1945 through 1965 and any individual with known risks for hepatitis C.  Practice safe sex. Use condoms and avoid high-risk sexual practices to reduce the spread of sexually transmitted infections (STIs). STIs include gonorrhea, chlamydia, syphilis, trichomonas, herpes, HPV, and human immunodeficiency virus (HIV). Herpes, HIV, and HPV are viral illnesses that have no cure. They can result in disability, cancer, and death.  You should be screened for sexually transmitted illnesses (STIs) including gonorrhea and chlamydia if:  You are sexually active and are younger than 24 years.  You are older than 24 years and your health care provider tells you that you are at risk for this type of infection.  Your sexual activity has changed since you were last screened and you are at an increased risk for chlamydia or gonorrhea. Ask your health care provider if you are at risk.  If you are at risk of being infected with HIV, it is recommended that you take a prescription medicine daily to prevent HIV infection. This is  called preexposure prophylaxis (PrEP). You are considered at risk if:  You are a heterosexual woman, are sexually active, and are at increased risk for HIV infection.  You take drugs by injection.  You are sexually active with a partner who has HIV.  Talk with your health care provider about whether you are at high risk of being infected with HIV. If you choose to begin PrEP, you should first be tested for HIV. You should then be tested every 3 months for as long as you are taking PrEP.  Osteoporosis is a disease in which the bones lose minerals and strength   with aging. This can result in serious bone fractures or breaks. The risk of osteoporosis can be identified using a bone density scan. Women ages 65 years and over and women at risk for fractures or osteoporosis should discuss screening with their health care providers. Ask your health care provider whether you should take a calcium supplement or vitamin D to reduce the rate of osteoporosis.  Menopause can be associated with physical symptoms and risks. Hormone replacement therapy is available to decrease symptoms and risks. You should talk to your health care provider about whether hormone replacement therapy is right for you.  Use sunscreen. Apply sunscreen liberally and repeatedly throughout the day. You should seek shade when your shadow is shorter than you. Protect yourself by wearing long sleeves, pants, a wide-brimmed hat, and sunglasses year round, whenever you are outdoors.  Once a month, do a whole body skin exam, using a mirror to look at the skin on your back. Tell your health care provider of new moles, moles that have irregular borders, moles that are larger than a pencil eraser, or moles that have changed in shape or color.  Stay current with required vaccines (immunizations).  Influenza vaccine. All adults should be immunized every year.  Tetanus, diphtheria, and acellular pertussis (Td, Tdap) vaccine. Pregnant women should  receive 1 dose of Tdap vaccine during each pregnancy. The dose should be obtained regardless of the length of time since the last dose. Immunization is preferred during the 27th-36th week of gestation. An adult who has not previously received Tdap or who does not know her vaccine status should receive 1 dose of Tdap. This initial dose should be followed by tetanus and diphtheria toxoids (Td) booster doses every 10 years. Adults with an unknown or incomplete history of completing a 3-dose immunization series with Td-containing vaccines should begin or complete a primary immunization series including a Tdap dose. Adults should receive a Td booster every 10 years.  Varicella vaccine. An adult without evidence of immunity to varicella should receive 2 doses or a second dose if she has previously received 1 dose. Pregnant females who do not have evidence of immunity should receive the first dose after pregnancy. This first dose should be obtained before leaving the health care facility. The second dose should be obtained 4-8 weeks after the first dose.  Human papillomavirus (HPV) vaccine. Females aged 13-26 years who have not received the vaccine previously should obtain the 3-dose series. The vaccine is not recommended for use in pregnant females. However, pregnancy testing is not needed before receiving a dose. If a female is found to be pregnant after receiving a dose, no treatment is needed. In that case, the remaining doses should be delayed until after the pregnancy. Immunization is recommended for any person with an immunocompromised condition through the age of 26 years if she did not get any or all doses earlier. During the 3-dose series, the second dose should be obtained 4-8 weeks after the first dose. The third dose should be obtained 24 weeks after the first dose and 16 weeks after the second dose.  Zoster vaccine. One dose is recommended for adults aged 60 years or older unless certain conditions are  present.  Measles, mumps, and rubella (MMR) vaccine. Adults born before 1957 generally are considered immune to measles and mumps. Adults born in 1957 or later should have 1 or more doses of MMR vaccine unless there is a contraindication to the vaccine or there is laboratory evidence of immunity to   each of the three diseases. A routine second dose of MMR vaccine should be obtained at least 28 days after the first dose for students attending postsecondary schools, health care workers, or international travelers. People who received inactivated measles vaccine or an unknown type of measles vaccine during 1963-1967 should receive 2 doses of MMR vaccine. People who received inactivated mumps vaccine or an unknown type of mumps vaccine before 1979 and are at high risk for mumps infection should consider immunization with 2 doses of MMR vaccine. For females of childbearing age, rubella immunity should be determined. If there is no evidence of immunity, females who are not pregnant should be vaccinated. If there is no evidence of immunity, females who are pregnant should delay immunization until after pregnancy. Unvaccinated health care workers born before 1957 who lack laboratory evidence of measles, mumps, or rubella immunity or laboratory confirmation of disease should consider measles and mumps immunization with 2 doses of MMR vaccine or rubella immunization with 1 dose of MMR vaccine.  Pneumococcal 13-valent conjugate (PCV13) vaccine. When indicated, a person who is uncertain of her immunization history and has no record of immunization should receive the PCV13 vaccine. An adult aged 19 years or older who has certain medical conditions and has not been previously immunized should receive 1 dose of PCV13 vaccine. This PCV13 should be followed with a dose of pneumococcal polysaccharide (PPSV23) vaccine. The PPSV23 vaccine dose should be obtained at least 8 weeks after the dose of PCV13 vaccine. An adult aged 19  years or older who has certain medical conditions and previously received 1 or more doses of PPSV23 vaccine should receive 1 dose of PCV13. The PCV13 vaccine dose should be obtained 1 or more years after the last PPSV23 vaccine dose.  Pneumococcal polysaccharide (PPSV23) vaccine. When PCV13 is also indicated, PCV13 should be obtained first. All adults aged 65 years and older should be immunized. An adult younger than age 65 years who has certain medical conditions should be immunized. Any person who resides in a nursing home or long-term care facility should be immunized. An adult smoker should be immunized. People with an immunocompromised condition and certain other conditions should receive both PCV13 and PPSV23 vaccines. People with human immunodeficiency virus (HIV) infection should be immunized as soon as possible after diagnosis. Immunization during chemotherapy or radiation therapy should be avoided. Routine use of PPSV23 vaccine is not recommended for American Indians, [place] Natives, or people younger than 65 years unless there are medical conditions that require PPSV23 vaccine. When indicated, people who have unknown immunization and have no record of immunization should receive PPSV23 vaccine. One-time revaccination 5 years after the first dose of PPSV23 is recommended for people aged 19-64 years who have chronic kidney failure, nephrotic syndrome, asplenia, or immunocompromised conditions. People who received 1-2 doses of PPSV23 before age 65 years should receive another dose of PPSV23 vaccine at age 65 years or later if at least 5 years have passed since the previous dose. Doses of PPSV23 are not needed for people immunized with PPSV23 at or after age 65 years.  Meningococcal vaccine. Adults with asplenia or persistent complement component deficiencies should receive 2 doses of quadrivalent meningococcal conjugate (MenACWY-D) vaccine. The doses should be obtained at least 2 months apart.  Microbiologists working with certain meningococcal bacteria, military recruits, people at risk during an outbreak, and people who travel to or live in countries with a high rate of meningitis should be immunized. A first-year college student up through age   21 years who is living in a residence hall should receive a dose if she did not receive a dose on or after her 16th birthday. Adults who have certain high-risk conditions should receive one or more doses of vaccine.  Hepatitis A vaccine. Adults who wish to be protected from this disease, have certain high-risk conditions, work with hepatitis A-infected animals, work in hepatitis A research labs, or travel to or work in countries with a high rate of hepatitis A should be immunized. Adults who were previously unvaccinated and who anticipate close contact with an international adoptee during the first 60 days after arrival in the [place] from a country with a high rate of hepatitis A should be immunized.  Hepatitis B vaccine. Adults who wish to be protected from this disease, have certain high-risk conditions, may be exposed to blood or other infectious body fluids, are household contacts or sex partners of hepatitis B positive people, are clients or workers in certain care facilities, or travel to or work in countries with a high rate of hepatitis B should be immunized.  Haemophilus influenzae type b (Hib) vaccine. A previously unvaccinated person with asplenia or sickle cell disease or having a scheduled splenectomy should receive 1 dose of Hib vaccine. Regardless of previous immunization, a recipient of a hematopoietic stem cell transplant should receive a 3-dose series 6-12 months after her successful transplant. Hib vaccine is not recommended for adults with HIV infection. Preventive Services / Frequency Ages 64 to 68 years  Blood pressure check.** / Every 1 to 2 years.  Lipid and cholesterol check.** / Every 5 years beginning at age  22.  Clinical breast exam.** / Every 3 years for women in their 88s and 53s.  BRCA-related cancer risk assessment.** / For women who have family members with a BRCA-related cancer (breast, ovarian, tubal, or peritoneal cancers).  Pap test.** / Every 2 years from ages 90 through 51. Every 3 years starting at age 21 through age 56 or 3 with a history of 3 consecutive normal Pap tests.  HPV screening.** / Every 3 years from ages 24 through ages 1 to 46 with a history of 3 consecutive normal Pap tests.  Hepatitis C blood test.** / For any individual with known risks for hepatitis C.  Skin self-exam. / Monthly.  Influenza vaccine. / Every year.  Tetanus, diphtheria, and acellular pertussis (Tdap, Td) vaccine.** / Consult your health care provider. Pregnant women should receive 1 dose of Tdap vaccine during each pregnancy. 1 dose of Td every 10 years.  Varicella vaccine.** / Consult your health care provider. Pregnant females who do not have evidence of immunity should receive the first dose after pregnancy.  HPV vaccine. / 3 doses over 6 months, if 72 and younger. The vaccine is not recommended for use in pregnant females. However, pregnancy testing is not needed before receiving a dose.  Measles, mumps, rubella (MMR) vaccine.** / You need at least 1 dose of MMR if you were born in 1957 or later. You may also need a 2nd dose. For females of childbearing age, rubella immunity should be determined. If there is no evidence of immunity, females who are not pregnant should be vaccinated. If there is no evidence of immunity, females who are pregnant should delay immunization until after pregnancy.  Pneumococcal 13-valent conjugate (PCV13) vaccine.** / Consult your health care provider.  Pneumococcal polysaccharide (PPSV23) vaccine.** / 1 to 2 doses if you smoke cigarettes or if you have certain conditions.  Meningococcal vaccine.** /  1 dose if you are age 19 to 21 years and a first-year college  student living in a residence hall, or have one of several medical conditions, you need to get vaccinated against meningococcal disease. You may also need additional booster doses.  Hepatitis A vaccine.** / Consult your health care provider.  Hepatitis B vaccine.** / Consult your health care provider.  Haemophilus influenzae type b (Hib) vaccine.** / Consult your health care provider. Ages 40 to 64 years  Blood pressure check.** / Every 1 to 2 years.  Lipid and cholesterol check.** / Every 5 years beginning at age 20 years.  Lung cancer screening. / Every year if you are aged 55-80 years and have a 30-pack-year history of smoking and currently smoke or have quit within the past 15 years. Yearly screening is stopped once you have quit smoking for at least 15 years or develop a health problem that would prevent you from having lung cancer treatment.  Clinical breast exam.** / Every year after age 40 years.  BRCA-related cancer risk assessment.** / For women who have family members with a BRCA-related cancer (breast, ovarian, tubal, or peritoneal cancers).  Mammogram.** / Every year beginning at age 40 years and continuing for as long as you are in good health. Consult with your health care provider.  Pap test.** / Every 3 years starting at age 30 years through age 65 or 70 years with a history of 3 consecutive normal Pap tests.  HPV screening.** / Every 3 years from ages 30 years through ages 65 to 70 years with a history of 3 consecutive normal Pap tests.  Fecal occult blood test (FOBT) of stool. / Every year beginning at age 50 years and continuing until age 75 years. You may not need to do this test if you get a colonoscopy every 10 years.  Flexible sigmoidoscopy or colonoscopy.** / Every 5 years for a flexible sigmoidoscopy or every 10 years for a colonoscopy beginning at age 50 years and continuing until age 75 years.  Hepatitis C blood test.** / For all people born from 1945 through  1965 and any individual with known risks for hepatitis C.  Skin self-exam. / Monthly.  Influenza vaccine. / Every year.  Tetanus, diphtheria, and acellular pertussis (Tdap/Td) vaccine.** / Consult your health care provider. Pregnant women should receive 1 dose of Tdap vaccine during each pregnancy. 1 dose of Td every 10 years.  Varicella vaccine.** / Consult your health care provider. Pregnant females who do not have evidence of immunity should receive the first dose after pregnancy.  Zoster vaccine.** / 1 dose for adults aged 60 years or older.  Measles, mumps, rubella (MMR) vaccine.** / You need at least 1 dose of MMR if you were born in 1957 or later. You may also need a 2nd dose. For females of childbearing age, rubella immunity should be determined. If there is no evidence of immunity, females who are not pregnant should be vaccinated. If there is no evidence of immunity, females who are pregnant should delay immunization until after pregnancy.  Pneumococcal 13-valent conjugate (PCV13) vaccine.** / Consult your health care provider.  Pneumococcal polysaccharide (PPSV23) vaccine.** / 1 to 2 doses if you smoke cigarettes or if you have certain conditions.  Meningococcal vaccine.** / Consult your health care provider.  Hepatitis A vaccine.** / Consult your health care provider.  Hepatitis B vaccine.** / Consult your health care provider.  Haemophilus influenzae type b (Hib) vaccine.** / Consult your health care provider. Ages 65   years and over  Blood pressure check.** / Every 1 to 2 years.  Lipid and cholesterol check.** / Every 5 years beginning at age 22 years.  Lung cancer screening. / Every year if you are aged 73-80 years and have a 30-pack-year history of smoking and currently smoke or have quit within the past 15 years. Yearly screening is stopped once you have quit smoking for at least 15 years or develop a health problem that would prevent you from having lung cancer  treatment.  Clinical breast exam.** / Every year after age 4 years.  BRCA-related cancer risk assessment.** / For women who have family members with a BRCA-related cancer (breast, ovarian, tubal, or peritoneal cancers).  Mammogram.** / Every year beginning at age 40 years and continuing for as long as you are in good health. Consult with your health care provider.  Pap test.** / Every 3 years starting at age 9 years through age 34 or 91 years with 3 consecutive normal Pap tests. Testing can be stopped between 65 and 70 years with 3 consecutive normal Pap tests and no abnormal Pap or HPV tests in the past 10 years.  HPV screening.** / Every 3 years from ages 57 years through ages 64 or 45 years with a history of 3 consecutive normal Pap tests. Testing can be stopped between 65 and 70 years with 3 consecutive normal Pap tests and no abnormal Pap or HPV tests in the past 10 years.  Fecal occult blood test (FOBT) of stool. / Every year beginning at age 15 years and continuing until age 17 years. You may not need to do this test if you get a colonoscopy every 10 years.  Flexible sigmoidoscopy or colonoscopy.** / Every 5 years for a flexible sigmoidoscopy or every 10 years for a colonoscopy beginning at age 86 years and continuing until age 71 years.  Hepatitis C blood test.** / For all people born from [birth year] through 1963 and any individual with known risks for hepatitis C.  Osteoporosis screening.** / A one-time screening for women ages 83 years and over and women at risk for fractures or osteoporosis.  Skin self-exam. / Monthly.  Influenza vaccine. / Every year.  Tetanus, diphtheria, and acellular pertussis (Tdap/Td) vaccine.** / 1 dose of Td every 10 years.  Varicella vaccine.** / Consult your health care provider.  Zoster vaccine.** / 1 dose for adults aged 61 years or older.  Pneumococcal 13-valent conjugate (PCV13) vaccine.** / Consult your health care provider.  Pneumococcal  polysaccharide (PPSV23) vaccine.** / 1 dose for all adults aged 28 years and older.  Meningococcal vaccine.** / Consult your health care provider.  Hepatitis A vaccine.** / Consult your health care provider.  Hepatitis B vaccine.** / Consult your health care provider.  Haemophilus influenzae type b (Hib) vaccine.** / Consult your health care provider. ** Family history and personal history of risk and conditions may change your health care provider's recommendations. Document Released: 09/07/2001 Document Revised: 11/26/2013 Document Reviewed: 12/07/2010 [facility] Patient Information 2015 [place], [place]. This information is not intended to replace advice given to you by your health care provider. Make sure you discuss any questions you have with your health care provider.

## 2014-08-20 NOTE — Telephone Encounter (Signed)
Last filled 07/22/14--please advise

## 2014-08-20 NOTE — Telephone Encounter (Signed)
Pt left v/m requesting rx hydrocodone apap. Call when ready for pick up.last filled 07/22/14 # 20.Please advise.

## 2014-08-20 NOTE — Progress Notes (Signed)
Patient ID: Kristina Savage, female   DOB: [birth year], 54 y.o.   MRN: [id number] 54 yo [id number] presenting today for follow up on a left ovarian cyst and annual exam. Patient was treated for diverticulitis on 12/28. During this work-up a CT incidentally found a 6.7 cm left ovarian cyst. A follow up ultrasound scheduled on 07/31/2014 demonstrates a benign appearing 6 cm left ovarian cyst. Patient is otherwise doing well and is without complaints. She has been menopausal for 8 years.  Past Medical History  Diagnosis Date  . Genital warts   . Chicken pox   . Hay fever   . UTI (lower urinary tract infection)    Past Surgical History  Procedure Laterality Date  . Cervix removal      Laser surgery, not complete removal  . Cesarean section     Family History  Problem Relation Age of Onset  . Arthritis Mother   . Hypertension Mother   . Arthritis Father   . Diabetes Son   . Cancer Maternal Aunt   . Diabetes Paternal Grandmother    History  Substance Use Topics  . Smoking status: Current Every Day Smoker -- 0.50 packs/day    Types: Cigarettes  . Smokeless tobacco: Never Used  . Alcohol Use: 0.0 oz/week    0 Not specified per week     Comment: occasional   GENERAL: Well-developed, well-nourished female in no acute distress.  HEENT: Normocephalic, atraumatic. Sclerae anicteric.  NECK: Supple. Normal thyroid.  LUNGS: Clear to auscultation bilaterally.  HEART: Regular rate and rhythm. BREASTS: Symmetric in size. No palpable masses or lymphadenopathy, skin changes, or nipple drainage. ABDOMEN: Soft, nontender, nondistended. No organomegaly. PELVIC: Normal external female genitalia. Vagina is pink and rugated.  Normal discharge. Normal appearing cervix. Bimanual exam limited secondary to body habitus. No adnexal mass or tenderness. EXTREMITIES: No cyanosis, clubbing, or edema, 2+ distal pulses.  07/31/2014 US FINDINGS: Uterus  Measurements: 8.7 x 4.1 x 4.8 cm. No fibroids or other  mass visualized.  Endometrium  Thickness: 6.1 mm. No focal abnormality visualized.  Right ovary  Measurements: 2.3 x 1.6 x 1.4 cm. Normal appearance/no adnexal mass.  Left ovary  Measurements: 2.3 x 1.5 x 1.9 cm. Cyst within the left ovary measures 5.7 x 5.0 x 5.5 cm. This appears anechoic. No mural nodule or internal septation.  Other findings  No free fluid.  IMPRESSION: 1. Left ovarian cyst. This is almost certainly benign, but follow up ultrasound is recommended in 1 year according to the Society of Radiologists in [place] Statement (D Clovis Riley et al. Management of Asymptomatic Ovarian and Other Adnexal Cysts Imaged at [place]: Society of Radiologists in [place] Statement 2010. Radiology 256 (Sept 2010): 277-412.).   Electronically Signed  By: Kerby Moors M.D.  On: 07/31/2014 13:54  A/P 54 yo with a left ovarian cyst benign in appearance - Reassurance provided - Will schedule follow up ultrasound in 6 months - pap smear collected - screening mammography ordered - patient will be contacted with any abnormal results with appropriate follow up - Follow up with PCP for routine care

## 2014-08-22 ENCOUNTER — Telehealth: Payer: Self-pay | Admitting: Gastroenterology

## 2014-08-22 ENCOUNTER — Other Ambulatory Visit: Payer: Self-pay

## 2014-08-22 DIAGNOSIS — K5732 Diverticulitis of large intestine without perforation or abscess without bleeding: Secondary | ICD-10-CM

## 2014-08-22 LAB — CYTOLOGY - PAP

## 2014-08-22 NOTE — Telephone Encounter (Signed)
Kristina Savage pts mother said pt was seen 08/13/14 by Dr Deborra Medina and GYN 08/20/14; pt continues with extreme lower abd pain; pt is at work due to missing so much work because of diverticulitis and doctors appts. Kristina Savage could not put a number 0 - 10 on pain level.Kristina Savage said GYN told pt did not think fluid filled cyst would cause this much pain and thinks diverticulitis is reason for pain. Kristina Savage does not think pt has fever; pt is out of pain med. Hydrocodone apap was filled 07/22/14 # 20 by Kristina Silversmith NP when pt was seen for diverticulitis. Kristina Savage request refill of hydrocodone apap. Kristina Savage is upset because no one contacted pt with response to previous refill request and request cb today. (pt has signed DPR for her mother Kristina Savage.).Offered to schedule f/u appt but Kristina Savage said pt cannot come back in to be rechecked due to missing so much work already.Please advise.

## 2014-08-22 NOTE — Telephone Encounter (Signed)
I am sorry she is so much pain.  If she feels this is due to diverticulitis, she really needs to contact GI who she saw earlier this month.  I am not comfortable giving her continued narcotics for this since her pain should be improving and I do not manage chronic pain.  Narcotics can worsen GI issues so she needs to contact GI.

## 2014-08-22 NOTE — Telephone Encounter (Signed)
Kristina Savage, pts mother notified as instructed; Kristina Savage voiced understanding and Kristina Savage will let pt know.

## 2014-08-22 NOTE — Telephone Encounter (Signed)
Call Pt at work [phone] Ext [phone]

## 2014-08-23 ENCOUNTER — Other Ambulatory Visit: Payer: Self-pay

## 2014-08-23 MED ORDER — HYOSCYAMINE SULFATE 0.125 MG SL SUBL: 0.2500 mg | SUBLINGUAL_TABLET | Freq: Three times a day (TID) | SUBLINGUAL | Status: DC | PRN

## 2014-08-23 NOTE — Telephone Encounter (Signed)
Pain is not worse. She is experiencing a lower abdominal discomfort that occurs after her meals and last for about 2 hours. She has been taking 1/2 of the Hydrocodone tablet daily when the pain hits. She is afebrile. She is having daily normal bowel movements. She feels she is compliant with her diet.

## 2014-08-23 NOTE — Telephone Encounter (Signed)
Would avoid pain meds. Make sure not constipated. Can try levsin .25 mg tid prn

## 2014-08-28 ENCOUNTER — Ambulatory Visit
Admission: RE | Admit: 2014-08-28 | Discharge: 2014-08-28 | Disposition: A | Discharge status: Home / Self Care | Payer: BLUE CROSS/BLUE SHIELD | Source: Ambulatory Visit | Attending: Obstetrics and Gynecology | Admitting: Obstetrics and Gynecology

## 2014-08-28 DIAGNOSIS — Z1231 Encounter for screening mammogram for malignant neoplasm of breast: Secondary | ICD-10-CM

## 2014-09-12 ENCOUNTER — Ambulatory Visit (AMBULATORY_SURGERY_CENTER): Payer: BLUE CROSS/BLUE SHIELD | Admitting: Gastroenterology

## 2014-09-12 ENCOUNTER — Encounter: Payer: Self-pay | Admitting: Gastroenterology

## 2014-09-12 VITALS — BP 117/74 | HR 67 | Temp 97.7°F | Resp 17 | Ht 64.0 in | Wt 243.0 lb

## 2014-09-12 DIAGNOSIS — K5730 Diverticulosis of large intestine without perforation or abscess without bleeding: Secondary | ICD-10-CM

## 2014-09-12 DIAGNOSIS — K635 Polyp of colon: Secondary | ICD-10-CM

## 2014-09-12 DIAGNOSIS — D125 Benign neoplasm of sigmoid colon: Secondary | ICD-10-CM

## 2014-09-12 DIAGNOSIS — K5732 Diverticulitis of large intestine without perforation or abscess without bleeding: Secondary | ICD-10-CM

## 2014-09-12 MED ORDER — SODIUM CHLORIDE 0.9 % IV SOLN: 500.0000 mL | INTRAVENOUS | Status: DC

## 2014-09-12 NOTE — Progress Notes (Signed)
Report to PACU, RN, vss, BBS= Clear.  

## 2014-09-12 NOTE — Progress Notes (Signed)
Called to room to assist during endoscopic procedure.  Patient ID and intended procedure confirmed with present staff. Received instructions for my participation in the procedure from the performing physician.  

## 2014-09-12 NOTE — Op Note (Signed)
[place]  [address]. [place], [address]   COLONOSCOPY PROCEDURE REPORT  PATIENT: Kristina Savage, Kristina Savage  MR#: [id number] BIRTHDATE: [birth year] , 47  yrs. old GENDER: female ENDOSCOPIST: Inda Castle, MD REFERRED KC:MKLKJ Aron, M.D. PROCEDURE DATE:  09/12/2014 PROCEDURE:   Colonoscopy with snare polypectomy First Screening Colonoscopy - Avg.  risk and is 50 yrs.  old or older Yes.  Prior Negative Screening - Now for repeat screening. N/A  History of Adenoma - Now for follow-up colonoscopy & has been > or = to 3 yrs.  N/A  Polyps Removed Today? Yes. ASA CLASS:   Class II INDICATIONS:average risk patient for colon cancer and Recent diverticulitis. MEDICATIONS: Monitored anesthesia care and Propofol 200 mg IV  DESCRIPTION OF PROCEDURE:   After the risks benefits and alternatives of the procedure were thoroughly explained, informed consent was obtained.  The digital rectal exam revealed no abnormalities of the rectum.   The LB [id number]  endoscope was introduced through the anus and advanced to the ileum. No adverse events experienced.   The quality of the prep was excellent using Suprep  The instrument was then slowly withdrawn as the colon was fully examined.      COLON FINDINGS: There was moderate diverticulosis noted in the sigmoid colon with associated colonic spasm and muscular hypertrophy.   A sessile polyp measuring 3 mm in size was found in the proximal sigmoid colon.  A polypectomy was performed with a cold snare.  The resection was complete, the polyp tissue was completely retrieved and sent to histology.   The examination was otherwise normal.  Retroflexed views revealed no abnormalities. The time to cecum=3 minutes 59 seconds.  Withdrawal time=10 minutes 44 seconds.  The scope was withdrawn and the procedure completed. COMPLICATIONS: There were no immediate complications.  ENDOSCOPIC IMPRESSION: 1.   There was moderate diverticulosis  noted in the sigmoid colon 2.   Sessile polyp was found in the proximal sigmoid colon; polypectomy was performed with a cold snare 3.   The examination was otherwise normal  RECOMMENDATIONS: If the polyp(s) removed today are proven to be adenomatous (pre-cancerous) polyps, you will need a repeat colonoscopy in 5 years.  Otherwise you should continue to follow colorectal cancer screening guidelines for "routine risk" patients with colonoscopy in 10 years.  You will receive a letter within 1-2 weeks with the results of your biopsy as well as final recommendations.  Please call my office if you have not received a letter after 3 weeks.  eSigned:  Inda Castle, MD 09/12/2014 8:39 AM   cc:   PATIENT NAME:  [name], [name] MR#: [id number]

## 2014-09-12 NOTE — Patient Instructions (Signed)
YOU HAD AN ENDOSCOPIC PROCEDURE TODAY AT [facility]: Refer to the procedure report that was given to you for any specific questions about what was found during the examination.  If the procedure report does not answer your questions, please call your gastroenterologist to clarify.  If you requested that your care partner not be given the details of your procedure findings, then the procedure report has been included in a sealed envelope for you to review at your convenience later.  YOU SHOULD EXPECT: Some feelings of bloating in the abdomen. Passage of more gas than usual.  Walking can help get rid of the air that was put into your GI tract during the procedure and reduce the bloating. If you had a lower endoscopy (such as a colonoscopy or flexible sigmoidoscopy) you may notice spotting of blood in your stool or on the toilet paper. If you underwent a bowel prep for your procedure, then you may not have a normal bowel movement for a few days.  DIET: Your first meal following the procedure should be a light meal and then it is ok to progress to your normal diet.  A half-sandwich or bowl of soup is an example of a good first meal.  Heavy or fried foods are harder to digest and may make you feel nauseous or bloated.  Likewise meals heavy in dairy and vegetables can cause extra gas to form and this can also increase the bloating.  Drink plenty of fluids but you should avoid alcoholic beverages for 24 hours. Increase the fiber in your diet.  ACTIVITY: Your care partner should take you home directly after the procedure.  You should plan to take it easy, moving slowly for the rest of the day.  You can resume normal activity the day after the procedure however you should NOT DRIVE or use heavy machinery for 24 hours (because of the sedation medicines used during the test).    SYMPTOMS TO REPORT IMMEDIATELY: A gastroenterologist can be reached at any hour.  During normal business hours, 8:30 AM to  5:00 PM Monday through Friday, call [phone].  After hours and on weekends, please call the GI answering service at [phone] who will take a message and have the physician on call contact you.   Following lower endoscopy (colonoscopy or flexible sigmoidoscopy):  Excessive amounts of blood in the stool  Significant tenderness or worsening of abdominal pains  Swelling of the abdomen that is new, acute  Fever of 100F or higher  FOLLOW UP: If any biopsies were taken you will be contacted by phone or by letter within the next 1-3 weeks.  Call your gastroenterologist if you have not heard about the biopsies in 3 weeks.  Our staff will call the home number listed on your records the next business day following your procedure to check on you and address any questions or concerns that you may have at that time regarding the information given to you following your procedure. This is a courtesy call and so if there is no answer at the home number and we have not heard from you through the emergency physician on call, we will assume that you have returned to your regular daily activities without incident.  SIGNATURES/CONFIDENTIALITY: You and/or your care partner have signed paperwork which will be entered into your electronic medical record.  These signatures attest to the fact that that the information above on your After Visit Summary has been reviewed and is understood.  Full  responsibility of the confidentiality of this discharge information lies with you and/or your care-partner.  Read all of the handouts given to you by your recovery room nurse.

## 2014-09-13 ENCOUNTER — Telehealth: Payer: Self-pay

## 2014-09-13 NOTE — Telephone Encounter (Signed)
Left a message at [phone] for the pt to call if any questions or concerns. maw

## 2014-09-22 ENCOUNTER — Encounter: Payer: Self-pay | Admitting: Gastroenterology

## 2015-05-14 ENCOUNTER — Encounter: Payer: Self-pay | Admitting: Family Medicine

## 2015-05-14 ENCOUNTER — Ambulatory Visit (INDEPENDENT_AMBULATORY_CARE_PROVIDER_SITE_OTHER): Payer: BLUE CROSS/BLUE SHIELD | Admitting: Family Medicine

## 2015-05-14 VITALS — BP 106/68 | HR 75 | Temp 98.3°F | Wt 243.5 lb

## 2015-05-14 DIAGNOSIS — J0110 Acute frontal sinusitis, unspecified: Secondary | ICD-10-CM

## 2015-05-14 MED ORDER — AMOXICILLIN 875 MG PO TABS: 875.0000 mg | ORAL_TABLET | Freq: Two times a day (BID) | ORAL | Status: DC

## 2015-05-14 NOTE — Progress Notes (Signed)
Subjective:   Patient ID: Kristina Savage, female    DOB: 1961-05-14, 54 y.o.   MRN: [id number]  LEEBA Savage is a pleasant 54 y.o. year old female who presents to clinic today with URI; Dizziness; and Nausea  on 05/14/2015  HPI:  Started with URI symptoms last week- runny nose, sneezing, sore throat, chills.  Started feeling better until last 1-2 days- acute onset of sinus pressure, nausea, dizziness. No CP or SOB. No vomiting or diarrhea. Coughing - non productive.  Not taking anything for it.  Current Outpatient Prescriptions on File Prior to Visit  Medication Sig Dispense Refill  . PROVENTIL HFA 108 (90 BASE) MCG/ACT inhaler Inhale 2 puffs into the lungs daily as needed for shortness of breath.   0   No current facility-administered medications on file prior to visit.    Allergies  Allergen Reactions  . Percocet [Oxycodone-Acetaminophen] Nausea And Vomiting    Past Medical History  Diagnosis Date  . Genital warts   . Chicken pox   . Hay fever   . UTI (lower urinary tract infection)     Past Surgical History  Procedure Laterality Date  . Cervix removal      Laser surgery, not complete removal  . Cesarean section      Family History  Problem Relation Age of Onset  . Arthritis Mother   . Hypertension Mother   . Arthritis Father   . Diabetes Son   . Cancer Maternal Aunt   . Diabetes Paternal Grandmother     Social History   Social History  . Marital Status: Married    Spouse Name: N/A  . Number of Children: 4  . 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 Smoking status: Current Every Day Smoker -- 0.50 packs/day    Types: Cigarettes  . Smokeless tobacco: Never Used  . Alcohol Use: 0.0 oz/week    0 Standard drinks or equivalent per week     Comment: occasional  . Drug Use: No  . Sexual Activity: Yes    Birth Control/ Protection: None   Other Topics Concern  . Not on file   Social History Narrative    The PMH, PSH, Social History, Family History, Medications, and allergies have been reviewed in [facility], and have been updated if relevant.   Review of Systems  Constitutional: Positive for fatigue. Negative for fever.  HENT: Positive for congestion, postnasal drip, rhinorrhea, sinus pressure and sore throat. Negative for sneezing, trouble swallowing and voice change.   Respiratory: Positive for cough. Negative for shortness of breath, wheezing and stridor.   Cardiovascular: Negative.   Gastrointestinal: Positive for nausea. Negative for vomiting and diarrhea.  Genitourinary: Negative.   Musculoskeletal: Negative.   Neurological: Positive for dizziness and headaches. Negative for tremors, seizures, syncope, facial asymmetry, speech difficulty, weakness, light-headedness and numbness.  Hematological: Negative.   Psychiatric/Behavioral: Negative.   All other systems reviewed and are negative.      Objective:    BP 106/68 mmHg  Pulse 75  Temp(Src) 98.3 F (36.8 C) (Oral)  Wt 243 lb 8 oz (110.451 kg)  SpO2 98%   Physical Exam  Constitutional: She is oriented to person, place, and time. She appears well-developed and well-nourished. No distress.  HENT:  Head: Normocephalic and atraumatic.  Right Ear: Tympanic membrane is retracted.  Left Ear: Tympanic membrane is erythematous.  Nose: Mucosal edema and rhinorrhea present. Right sinus  exhibits frontal sinus tenderness. Right sinus exhibits no maxillary sinus tenderness. Left sinus exhibits frontal sinus tenderness. Left sinus exhibits no maxillary sinus tenderness.  Mouth/Throat: Uvula is midline. Posterior oropharyngeal erythema present. No oropharyngeal exudate, posterior oropharyngeal edema or tonsillar abscesses.  Eyes: Conjunctivae are normal.  Cardiovascular: Normal rate and regular rhythm.   Pulmonary/Chest: Effort normal and breath sounds normal. No respiratory distress. She has no wheezes.  Musculoskeletal: Normal range of  motion. She exhibits no edema.  Neurological: She is alert and oriented to person, place, and time. No cranial nerve deficit.  Skin: Skin is warm and dry.  Psychiatric: She has a normal mood and affect. Her behavior is normal. Thought content normal.  Nursing note and vitals reviewed.         Assessment & Plan:   Acute frontal sinusitis, recurrence not specified No Follow-up on file.

## 2015-05-14 NOTE — Progress Notes (Signed)
Pre visit review using our clinic review tool, if applicable. No additional management support is needed unless otherwise documented below in the visit note. 

## 2015-05-14 NOTE — Patient Instructions (Signed)
Take antibiotic- Amoxicillin twice daily x 10 days as directed.  Drink lots of fluids.    Treat sympotmatically with Mucinex, nasal saline irrigation, and Tylenol/Ibuprofen.   Also try an antihistamine/decongestant like claritin D or zyrtec D over the counter- two times a day as needed ( have to sign for them at pharmacy).   Try over the counter nasocort-start with 2 sprays per nostril per day...and then try to taper to 1 spray per nostril once symptoms improve.   You can use warm compresses.     Call if not improving as expected in 5-7 days.    Please make an appointment to see Dr. Lorelei Pont on your way out.

## 2015-05-14 NOTE — Assessment & Plan Note (Signed)
Given duration and progression of symptoms, will treat for bacterial sinusitis with 10 day course of amoxicillin 875 mg twice daily x 10 days.  Supportive care as per AVS. Call or return to clinic prn if these symptoms worsen or fail to improve as anticipated. The patient indicates understanding of these issues and agrees with the plan.

## 2015-05-16 ENCOUNTER — Encounter: Payer: Self-pay | Admitting: Family Medicine

## 2015-05-16 NOTE — Telephone Encounter (Signed)
Patient also called triage for advice.  She has been on amoxicillin x2 days for bacterial sinusitis.  She is also using otc Tylenol Sinus and a nasal spray.  Symptoms seem to be worsening.  Patient c/o night sweats, dizziness, and a new cough that is keeping her up at night.  Patient advised symptoms not likely to clear after only 2 days.  She would like advise on what to do next.  Please advise.

## 2015-05-19 ENCOUNTER — Telehealth: Payer: Self-pay | Admitting: *Deleted

## 2015-05-19 NOTE — Telephone Encounter (Signed)
Error

## 2015-05-19 NOTE — Telephone Encounter (Signed)
Left message for patient to return call.

## 2015-05-21 ENCOUNTER — Ambulatory Visit (INDEPENDENT_AMBULATORY_CARE_PROVIDER_SITE_OTHER): Payer: BLUE CROSS/BLUE SHIELD | Admitting: Family Medicine

## 2015-05-21 ENCOUNTER — Ambulatory Visit (INDEPENDENT_AMBULATORY_CARE_PROVIDER_SITE_OTHER)
Admission: RE | Admit: 2015-05-21 | Discharge: 2015-05-21 | Disposition: A | Discharge status: Home / Self Care | Payer: BLUE CROSS/BLUE SHIELD | Source: Ambulatory Visit | Attending: Family Medicine | Admitting: Family Medicine

## 2015-05-21 ENCOUNTER — Encounter: Payer: Self-pay | Admitting: Family Medicine

## 2015-05-21 VITALS — BP 112/66 | HR 74 | Temp 97.6°F | Ht 64.0 in | Wt 247.8 lb

## 2015-05-21 DIAGNOSIS — M629 Disorder of muscle, unspecified: Secondary | ICD-10-CM | POA: Diagnosis not present

## 2015-05-21 DIAGNOSIS — M79672 Pain in left foot: Secondary | ICD-10-CM

## 2015-05-21 DIAGNOSIS — S93699A Other sprain of unspecified foot, initial encounter: Secondary | ICD-10-CM

## 2015-05-21 NOTE — Progress Notes (Signed)
Dr. Frederico Hamman T. Ara Mano, MD, [facility] [place], [address] Phone: [phone] Fax: [phone]  05/21/2015  Patient: Kristina Savage, MRN: [id number], DOB: [birth year], 54 y.o.  Primary Physician:  Arnette Norris, MD   Chief Complaint  Patient presents with  . Foot Pain    Left x 1 month   Subjective:   Kristina Savage is a 54 y.o. very pleasant female patient who presents with the following:  Consulting MD: Dr. Deborra Medina Reason: L foot pain x 1 month  Very pleasnt patient stepped off of her bottom step and stepped incorrectly and felt a pop and two days later, her whole foot was swollen. Ever since then she has been having quite a bit of difficulty plantar flexing her foot and her gait is been altered.  She is having pain on the plantar aspect of her foot, and she also is having some pain medially.  She can point to exactly where it is having problems and pain.  She currently does not have any bruising.  Elevated, did some ice. Went down some over the last month.  She also has done basic over-the-counter medication such as Tylenol and NSAIDs.  She also bought a ankle brace which seems did not really help all that much.  Past Medical History, Surgical History, Social History, Family History, Problem List, Medications, and Allergies have been reviewed and updated if relevant.  Patient Active Problem List   Diagnosis Date Noted  . Sinusitis, acute frontal 05/14/2015  . Diverticulitis of colon 08/13/2014  . Ovarian cyst 08/13/2014  . Abdominal pain, left lower quadrant 08/13/2014    Past Medical History  Diagnosis Date  . Genital warts   . Chicken pox   . Hay fever   . UTI (lower urinary tract infection)     Past Surgical History  Procedure Laterality Date  . Cervix removal      Laser surgery, not complete removal  . Cesarean section      Social History   Social History  . Marital Status: Married    Spouse Name:  N/A  . Number of Children: 4  . 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 Smoking status: Current Every Day Smoker -- 0.50 packs/day    Types: Cigarettes  . Smokeless tobacco: Never Used  . Alcohol Use: 0.0 oz/week    0 Standard drinks or equivalent per week     Comment: occasional  . Drug Use: No  . Sexual Activity: Yes    Birth Control/ Protection: None   Other Topics Concern  . Not on file   Social History Narrative    Family History  Problem Relation Age of Onset  . Arthritis Mother   . Hypertension Mother   . Arthritis Father   . Diabetes Son   . Cancer Maternal Aunt   . Diabetes Paternal Grandmother     Allergies  Allergen Reactions  . Percocet [Oxycodone-Acetaminophen] Nausea And Vomiting    Medication list reviewed and updated in full in [facility].  GEN: No fevers, chills. Nontoxic. Primarily MSK c/o today. MSK: Detailed in the HPI GI: tolerating PO intake without difficulty Neuro: No numbness, parasthesias, or tingling associated. Otherwise the pertinent positives of the ROS are noted above.   Objective:   BP 112/66 mmHg  Pulse 74  Temp(Src) 97.6 F (36.4 C) (Oral)  Ht 5'  4" (1.626 m)  Wt 247 lb 12 oz (112.379 kg)  BMI 42.51 kg/m2   GEN: WDWN, NAD, Non-toxic, Alert & Oriented x 3 HEENT: Atraumatic, Normocephalic.  Ears and Nose: No external deformity. EXTR: No clubbing/cyanosis/edema NEURO: normal sensation. neurovasc intact.  PSYCH: Normally interactive. Conversant. Not depressed or anxious appearing.  Calm demeanor.   FEET: B Echymosis: no Edema: mild medially on the L ROM: full LE B Gait: heel toe, minimal plantar flexion MT pain: no Callus pattern: none Lateral Mall: NT Medial Mall: NT Talus: NT Navicular: NT Cuboid: NT Calcaneous: NT Metatarsals: NT 5th MT: NT Phalanges: NT Achilles: NT Plantar Fascia: TTP more medially Fat Pad: NT Peroneals: NT Post Tib: TTP along  course Great Toe: Nml motion Ant Drawer: neg ATFL: NT CFL: NT Deltoid: NT Other foot breakdown: none Long arch: preserved Transverse arch: preserved Hindfoot breakdown: none Sensation: intact   Radiology: Dg Foot Complete Left  05/21/2015  CLINICAL DATA:  LEFT foot pain, stepped off a step and felt a pop, question posterior tibialis tendon injury, assess navicular and midfoot EXAM: LEFT FOOT - COMPLETE 3+ VIEW COMPARISON:  None. FINDINGS: Osseous mineralization normal. Joint spaces preserved. Plantar and Achilles insertion calcaneal spurs. No acute fracture, dislocation or bone destruction. IMPRESSION: Mild calcaneal spurring. No acute osseous abnormalities. Electronically Signed   By: Lavonia Dana M.D.   On: 05/21/2015 14:56     Assessment and Plan:   Left foot pain - Plan: DG Foot Complete Left  Plantar fascia rupture - Plan: PR DME SUPPLY OR ACCESSORY, NOS  Consistent by history and exam partial plantar fascia rupture on the left with incomplete healing and secondary posterior tibialis tendinopathy.  Tendinopathy likely secondary from altered gait secondary to initial injury.  All other bony anatomy and ligamentous structures are intact and stable.  Place the patient in a short cam walker boot for approximately 3-4 weeks, potentially longer if slower healing.  Unfortunately, we were out of her size, so I gave her a prescription to get one from the pharmacy next door.  We will recheck her in 3 weeks.  Likelihood of full recovery is excellent.  I appreciate the opportunity to evaluate this very friendly patient. If you have any question regarding her care or prognosis, do not hesitate to ask.   Follow-up: Return in about 3 weeks (around 06/11/2015).  New Prescriptions   No medications on file   Modified Medications   No medications on file   Orders Placed This Encounter  Procedures  . DG Foot Complete Left  . PR DME SUPPLY OR ACCESSORY, NOS    Signed,  Kielan Dreisbach T.  Zymarion Favorite, MD   Patient's Medications  New Prescriptions   No medications on file  Previous Medications   AMOXICILLIN (AMOXIL) 875 MG TABLET    Take 1 tablet (875 mg total) by mouth 2 (two) times daily.   PROVENTIL HFA 108 (90 BASE) MCG/ACT INHALER    Inhale 2 puffs into the lungs daily as needed for shortness of breath.   Modified Medications   No medications on file  Discontinued Medications   No medications on file

## 2015-05-21 NOTE — Progress Notes (Signed)
Pre visit review using our clinic review tool, if applicable. No additional management support is needed unless otherwise documented below in the visit note. 

## 2015-05-28 ENCOUNTER — Emergency Department (HOSPITAL_COMMUNITY)
Admission: EM | Admit: 2015-05-28 | Discharge: 2015-05-28 | Disposition: A | Discharge status: Home / Self Care | Payer: BLUE CROSS/BLUE SHIELD | Attending: Emergency Medicine | Admitting: Emergency Medicine

## 2015-05-28 ENCOUNTER — Encounter: Payer: Self-pay | Admitting: Family Medicine

## 2015-05-28 ENCOUNTER — Encounter (HOSPITAL_COMMUNITY): Payer: Self-pay

## 2015-05-28 ENCOUNTER — Ambulatory Visit (INDEPENDENT_AMBULATORY_CARE_PROVIDER_SITE_OTHER): Payer: BLUE CROSS/BLUE SHIELD | Admitting: Family Medicine

## 2015-05-28 VITALS — BP 124/80 | HR 100 | Temp 98.5°F | Ht 64.0 in | Wt 245.8 lb

## 2015-05-28 DIAGNOSIS — Z720 Tobacco use: Secondary | ICD-10-CM | POA: Insufficient documentation

## 2015-05-28 DIAGNOSIS — M79672 Pain in left foot: Secondary | ICD-10-CM

## 2015-05-28 DIAGNOSIS — Z792 Long term (current) use of antibiotics: Secondary | ICD-10-CM | POA: Insufficient documentation

## 2015-05-28 DIAGNOSIS — Z87440 Personal history of urinary (tract) infections: Secondary | ICD-10-CM | POA: Diagnosis not present

## 2015-05-28 DIAGNOSIS — Z8619 Personal history of other infectious and parasitic diseases: Secondary | ICD-10-CM | POA: Insufficient documentation

## 2015-05-28 MED ORDER — MELOXICAM 7.5 MG PO TABS: 7.5000 mg | ORAL_TABLET | Freq: Every day | ORAL | Status: DC

## 2015-05-28 NOTE — ED Provider Notes (Signed)
CSN: [id number]     Arrival date & time 05/28/15  0518 History   First MD Initiated Contact with Patient 05/28/15 0555     Chief Complaint  Patient presents with  . Foot Pain     (Consider location/radiation/quality/duration/timing/severity/associated sxs/prior Treatment) HPI  Pt presenting with c/o left foot pain.  She states she injured her foot approx 1 month ago- pain continued so she saw a sports medicine physician one week ago. Per chart review he diagnosed her with a plantar fascial tear and recommended she wear a cam walker.  She states that since one week ago the pain in the arch of her foot has increased.  No additional injury.  She states she has tried ibuprofen and applying ice.  She is concerrned that the sports medicine did not do an MRI of her foot.  Pain is constant and aching.  There are no other associated systemic symptoms, there are no other alleviating or modifying factors.   Past Medical History  Diagnosis Date  . Genital warts   . Chicken pox   . Hay fever   . UTI (lower urinary tract infection)    Past Surgical History  Procedure Laterality Date  . Cervix removal      Laser surgery, not complete removal  . Cesarean section     Family History  Problem Relation Age of Onset  . Arthritis Mother   . Hypertension Mother   . Arthritis Father   . Diabetes Son   . Cancer Maternal Aunt   . Diabetes Paternal Grandmother    Social History  Substance Use Topics  . Smoking status: Current Every Day Smoker -- 0.50 packs/day    Types: Cigarettes  . Smokeless tobacco: Never Used  . Alcohol Use: 0.0 oz/week    0 Standard drinks or equivalent per week     Comment: occasional   OB History    No data available     Review of Systems  ROS reviewed and all otherwise negative except for mentioned in HPI    Allergies  Percocet  Home Medications   Prior to Admission medications   Medication Sig Start Date End Date Taking? Authorizing Provider  amoxicillin  (AMOXIL) 875 MG tablet Take 1 tablet (875 mg total) by mouth 2 (two) times daily. 05/14/15   Lucille Passy, MD  meloxicam (MOBIC) 7.5 MG tablet Take 1 tablet (7.5 mg total) by mouth daily. 05/28/15   Alfonzo Beers, MD  PROVENTIL HFA 108 (90 BASE) MCG/ACT inhaler Inhale 2 puffs into the lungs daily as needed for shortness of breath.  05/17/14   Historical Provider, MD   BP 129/58 mmHg  Pulse 104  Temp(Src) 97.8 F (36.6 C) (Oral)  Resp 16  SpO2 98%  Vitals reviewed Physical Exam  Physical Examination: General appearance - alert, well appearing, and in no distress Mental status - alert, oriented to person, place, and time Eyes - no conjunctival injection no scleral icterus Neurological - alert, oriented, normal speech, sensation distally intact in toes Musculoskeletal - ttp over left foot arch with some mild soft tissue swelling, otherwise  no joint tenderness, deformity or swelling Extremities - peripheral pulses normal, no pedal edema, no clubbing or cyanosis Skin - normal coloration and turgor, no overlying redness to foot  ED Course  Procedures (including critical care time) Labs Review Labs Reviewed - No data to display  Imaging Review No results found. I have personally reviewed and evaluated these images and lab results as part of  my medical decision-making.   EKG Interpretation None      MDM   Final diagnoses:  Foot pain, left    Pt presenting with ongoing pain in the arch of her left foot.  She has seen sports medicine and has been wearing cam walker, but states her pain has increased.  On exam no evidence of pedal edema or leg swelling to suggest DVT- she has no chest pain or shortness of breath.  Will give meloxicam for pain and advised patient to f/u with sports medicine to let them know her symptoms are not improving in the cam walker.  No indication for emergent MRI today, discussed this with the patient and she verbalized understanding.  Discharged with strict  return precautions.  Pt agreeable with plan.    Alfonzo Beers, MD 05/28/15 [phone]

## 2015-05-28 NOTE — Discharge Instructions (Signed)
Return to the ED with any concerns including swelling/numbness/discoloration of foot or toes, or any other alarming symptoms

## 2015-05-28 NOTE — ED Notes (Signed)
Per pt she hurt her foot a week ago and her dr told her to wear "the boot". According to the pts chart she was supposed to wear the boot for 3 weeks starting the 26th. The pt states that her doctor says that she tore a ligament. She said her PCP sent her to an orthopedist or sports medicine person for an MRI and they only did an XRAY. The Xray from that day shows no evidence of fracture. Pt states that when she is walking without the boot her foot hurts and that it has been hurting when she is at work "and on my arch". No obvious deformity noted, very mild swelling is noted to the inner aspect of the left foot.

## 2015-05-28 NOTE — Progress Notes (Signed)
Pre visit review using our clinic review tool, if applicable. No additional management support is needed unless otherwise documented below in the visit note. 

## 2015-05-28 NOTE — Patient Instructions (Signed)

## 2015-05-28 NOTE — ED Notes (Signed)
MD at bedside. 

## 2015-05-29 NOTE — Progress Notes (Signed)
Dr. Frederico Hamman T. Collins Kerby, MD, [facility] [place], [address] Phone: [phone] Fax: [phone]  05/28/2015  Patient: Kristina Savage, MRN: [id number], DOB: Jul 07, 1961, 54 y.o.  Primary Physician:  Arnette Norris, MD   Chief Complaint  Patient presents with  . Foot Pain    Pain has gottwn worse-went to ED last night   Subjective:   SHAKIYAH Savage is a 54 y.o. very pleasant female patient who presents with the following:  The patient has had a dramatic turn for the worse, and I saw her only one week ago.  At that point I thought that she primarily had a plantar fascia partial rupture, but she is had significant, and dramatic  Increase in her pain in the last few days.  She is having difficulty ambulating now, and she actually went to the emergency department last evening.  She does have a short pneumatic cam walker boot, which is helped somewhat, but now her foot is hurting to the degree where she is having trouble regulating.  There is also continuing some swelling  05/21/2015 Last OV with Owens Loffler, MD  Consulting MD: Dr. Deborra Medina Reason: L foot pain x 1 month  Very pleasnt patient stepped off of her bottom step and stepped incorrectly and felt a pop and two days later, her whole foot was swollen. Ever since then she has been having quite a bit of difficulty plantar flexing her foot and her gait is been altered.  She is having pain on the plantar aspect of her foot, and she also is having some pain medially.  She can point to exactly where it is having problems and pain.  She currently does not have any bruising.  Elevated, did some ice. Went down some over the last month.  She also has done basic over-the-counter medication such as Tylenol and NSAIDs.  She also bought a ankle brace which seems did not really help all that much.  Past Medical History, Surgical History, Social History, Family History, Problem List, Medications,  and Allergies have been reviewed and updated if relevant.  Patient Active Problem List   Diagnosis Date Noted  . Sinusitis, acute frontal 05/14/2015  . Diverticulitis of colon 08/13/2014  . Ovarian cyst 08/13/2014  . Abdominal pain, left lower quadrant 08/13/2014    Past Medical History  Diagnosis Date  . Genital warts   . Chicken pox   . Hay fever   . UTI (lower urinary tract infection)     Past Surgical History  Procedure Laterality Date  . Cervix removal      Laser surgery, not complete removal  . Cesarean section      Social History   Social History  . Marital Status: Married    Spouse Name: N/A  . Number of Children: 4  . 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 Smoking status: Current Every Day Smoker -- 0.50 packs/day    Types: Cigarettes  . Smokeless tobacco: Never Used  . Alcohol Use: 0.0 oz/week    0 Standard drinks or equivalent per week     Comment: occasional  . Drug Use: No  . Sexual Activity: Yes    Birth Control/ Protection: None   Other Topics Concern  . Not on file   Social History Narrative    Family History  Problem Relation Age of Onset  . Arthritis  Mother   . Hypertension Mother   . Arthritis Father   . Diabetes Son   . Cancer Maternal Aunt   . Diabetes Paternal Grandmother     Allergies  Allergen Reactions  . Percocet [Oxycodone-Acetaminophen] Nausea And Vomiting    Medication list reviewed and updated in full in [facility].  GEN: No fevers, chills. Nontoxic. Primarily MSK c/o today. MSK: Detailed in the HPI GI: tolerating PO intake without difficulty Neuro: No numbness, parasthesias, or tingling associated. Otherwise the pertinent positives of the ROS are noted above.   Objective:   BP 124/80 mmHg  Pulse 100  Temp(Src) 98.5 F (36.9 C) (Oral)  Ht 5\' 4"  (1.626 m)  Wt 245 lb 12 oz (111.471 kg)  BMI 42.16 kg/m2   GEN: WDWN, NAD, Non-toxic, Alert & Oriented x  3 HEENT: Atraumatic, Normocephalic.  Ears and Nose: No external deformity. EXTR: No clubbing/cyanosis/edema NEURO: normal sensation. neurovasc intact.  PSYCH: Normally interactive. Conversant. Not depressed or anxious appearing.  Calm demeanor.   FEET: B Echymosis: no Edema: mild medially on the L ROM: full LE B Gait: heel toe, minimal plantar flexion MT pain: no Callus pattern: none Lateral Mall: NT Medial Mall: NT Generalized, non-specific midfoot pain Metatarsals: NT 5th MT: NT Phalanges: NT Achilles: NT Plantar Fascia: TTP more medially Fat Pad: NT Peroneals: NT Post Tib: TTP along course Great Toe: Nml motion Ant Drawer: neg ATFL: NT CFL: NT Deltoid: NT Sensation: intact   Radiology: Dg Foot Complete Left  05/21/2015  CLINICAL DATA:  LEFT foot pain, stepped off a step and felt a pop, question posterior tibialis tendon injury, assess navicular and midfoot EXAM: LEFT FOOT - COMPLETE 3+ VIEW COMPARISON:  None. FINDINGS: Osseous mineralization normal. Joint spaces preserved. Plantar and Achilles insertion calcaneal spurs. No acute fracture, dislocation or bone destruction. IMPRESSION: Mild calcaneal spurring. No acute osseous abnormalities. Electronically Signed   By: Lavonia Dana M.D.   On: 05/21/2015 14:56    Assessment and Plan:   Left foot pain - Plan: MR Foot Left Wo Contrast  High level of clinical concern in a patient who is dramatically decompensated in 1 week's time.  Initial thought was plantar fascial injury, but given clinical worsening, occult fracture or stress fracture cannot be excluded throughout the entirety of the midfoot.  Obtain an MRI of the left foot to evaluate for occult fracture, stress fracture, ligamentous disruption, tendon injury, longitudinal tendon tear, plantar fascia disruption, or other derangement of the foot that would explain the patient's acute and worsening pain.  I have made her non-weightbearing on crutches.  Orders Placed This  Encounter  Procedures  . MR Foot Left Wo Contrast    Signed,  Sudeep Scheibel T. Lynee Rosenbach, MD   Patient's Medications  New Prescriptions   No medications on file  Previous Medications   MELOXICAM (MOBIC) 7.5 MG TABLET    Take 1 tablet (7.5 mg total) by mouth daily.   PROVENTIL HFA 108 (90 BASE) MCG/ACT INHALER    Inhale 2 puffs into the lungs daily as needed for shortness of breath.   Modified Medications   No medications on file  Discontinued Medications   AMOXICILLIN (AMOXIL) 875 MG TABLET    Take 1 tablet (875 mg total) by mouth 2 (two) times daily.

## 2015-06-03 ENCOUNTER — Ambulatory Visit (HOSPITAL_COMMUNITY)
Admission: RE | Admit: 2015-06-03 | Discharge: 2015-06-03 | Disposition: A | Discharge status: Home / Self Care | Payer: BLUE CROSS/BLUE SHIELD | Source: Ambulatory Visit | Attending: Family Medicine | Admitting: Family Medicine

## 2015-06-03 DIAGNOSIS — M79672 Pain in left foot: Secondary | ICD-10-CM | POA: Diagnosis present

## 2015-06-03 DIAGNOSIS — M659 Synovitis and tenosynovitis, unspecified: Secondary | ICD-10-CM | POA: Diagnosis not present

## 2015-06-04 ENCOUNTER — Telehealth: Payer: Self-pay | Admitting: Family Medicine

## 2015-06-04 ENCOUNTER — Other Ambulatory Visit: Payer: Self-pay | Admitting: Family Medicine

## 2015-06-04 MED ORDER — PREDNISONE 20 MG PO TABS: ORAL_TABLET | ORAL | Status: DC

## 2015-06-04 NOTE — Telephone Encounter (Signed)
See result note.  

## 2015-06-04 NOTE — Telephone Encounter (Signed)
Pt is requesting cb regarding mri that was done yesterday. Please call [phone]. Thanks

## 2015-06-04 NOTE — Telephone Encounter (Signed)
Pt called back checking on mri results best number to call [phone]

## 2015-06-04 NOTE — Telephone Encounter (Signed)
Noted, will call at end of work day.

## 2015-06-11 ENCOUNTER — Ambulatory Visit: Payer: BLUE CROSS/BLUE SHIELD | Admitting: Family Medicine

## 2015-06-18 ENCOUNTER — Encounter: Payer: Self-pay | Admitting: Family Medicine

## 2015-06-18 ENCOUNTER — Ambulatory Visit (INDEPENDENT_AMBULATORY_CARE_PROVIDER_SITE_OTHER): Payer: BLUE CROSS/BLUE SHIELD | Admitting: Family Medicine

## 2015-06-18 VITALS — BP 120/90 | HR 87 | Temp 98.2°F | Ht 64.0 in | Wt 252.5 lb

## 2015-06-18 DIAGNOSIS — M7672 Peroneal tendinitis, left leg: Secondary | ICD-10-CM | POA: Diagnosis not present

## 2015-06-18 DIAGNOSIS — M76822 Posterior tibial tendinitis, left leg: Secondary | ICD-10-CM

## 2015-06-18 DIAGNOSIS — M6788 Other specified disorders of synovium and tendon, other site: Secondary | ICD-10-CM

## 2015-06-18 NOTE — Progress Notes (Signed)
Dr. Frederico Hamman T. Tammatha Cobb, MD, [facility] and Sports Medicine [place], [address] Phone: [phone] Fax: [phone]  06/18/2015  Patient: Kristina Savage, MRN: [id number], DOB: 04/13/1961, 54 y.o.  Primary Physician:  Arnette Norris, MD   Chief Complaint  Patient presents with  . Follow-up    Left Foot   Subjective:   CONNIE MURRIETTA is a 54 y.o. very pleasant female patient who presents with the following:  F/u L PT and peroneal tendinopathy:  ? Tendon injury initially  She is doing quite a bit better compared to last time I saw her.  She has been ordered cam walker boot, and I did review her MRI with her over the phone.  There is no evidence for fracture, and there is significant involvement and tendinopathy of both the peroneal and posterior tibialis tendons.  05/28/2015 Last OV with Owens Loffler, MD  The patient has had a dramatic turn for the worse, and I saw her only one week ago.  At that point I thought that she primarily had a plantar fascia partial rupture, but she is had significant, and dramatic  Increase in her pain in the last few days.  She is having difficulty ambulating now, and she actually went to the emergency department last evening.  She does have a short pneumatic cam walker boot, which is helped somewhat, but now her foot is hurting to the degree where she is having trouble regulating.  There is also continuing some swelling  05/21/2015 Last OV with Owens Loffler, MD  Consulting MD: Dr. Deborra Medina Reason: L foot pain x 1 month  Very pleasnt patient stepped off of her bottom step and stepped incorrectly and felt a pop and two days later, her whole foot was swollen. Ever since then she has been having quite a bit of difficulty plantar flexing her foot and her gait is been altered.  She is having pain on the plantar aspect of her foot, and she also is having some pain medially.  She can point to exactly where it is having  problems and pain.  She currently does not have any bruising.  Elevated, did some ice. Went down some over the last month.  She also has done basic over-the-counter medication such as Tylenol and NSAIDs.  She also bought a ankle brace which seems did not really help all that much.  Past Medical History, Surgical History, Social History, Family History, Problem List, Medications, and Allergies have been reviewed and updated if relevant.  Patient Active Problem List   Diagnosis Date Noted  . Sinusitis, acute frontal 05/14/2015  . Diverticulitis of colon 08/13/2014  . Ovarian cyst 08/13/2014  . Abdominal pain, left lower quadrant 08/13/2014    Past Medical History  Diagnosis Date  . Genital warts   . Chicken pox   . Hay fever   . UTI (lower urinary tract infection)     Past Surgical History  Procedure Laterality Date  . Cervix removal      Laser surgery, not complete removal  . Cesarean section      Social History   Social History  . Marital Status: Married    Spouse Name: N/A  . Number of Children: 4  . 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 Smoking status: Current Every Day Smoker -- 0.50 packs/day    Types: Cigarettes  . Smokeless tobacco: Never  Used  . Alcohol Use: 0.0 oz/week    0 Standard drinks or equivalent per week     Comment: occasional  . Drug Use: No  . Sexual Activity: Yes    Birth Control/ Protection: None   Other Topics Concern  . Not on file   Social History Narrative    Family History  Problem Relation Age of Onset  . Arthritis Mother   . Hypertension Mother   . Arthritis Father   . Diabetes Son   . Cancer Maternal Aunt   . Diabetes Paternal Grandmother     Allergies  Allergen Reactions  . Percocet [Oxycodone-Acetaminophen] Nausea And Vomiting    Medication list reviewed and updated in full in [facility].  GEN: No fevers, chills. Nontoxic. Primarily MSK c/o today. MSK:  Detailed in the HPI GI: tolerating PO intake without difficulty Neuro: No numbness, parasthesias, or tingling associated. Otherwise the pertinent positives of the ROS are noted above.   Objective:   BP 120/90 mmHg  Pulse 87  Temp(Src) 98.2 F (36.8 C) (Oral)  Ht 5\' 4"  (1.626 m)  Wt 252 lb 8 oz (114.533 kg)  BMI 43.32 kg/m2   GEN: WDWN, NAD, Non-toxic, Alert & Oriented x 3 HEENT: Atraumatic, Normocephalic.  Ears and Nose: No external deformity. EXTR: No clubbing/cyanosis/edema NEURO: normal sensation. neurovasc intact.  PSYCH: Normally interactive. Conversant. Not depressed or anxious appearing.  Calm demeanor.   FEET: B Echymosis: no Edema: none ROM: full LE B Gait: heel toe, improved MT pain: no Callus pattern: none Lateral Mall: NT Medial Mall: NT Midfoot pain improved Metatarsals: NT 5th MT: NT Phalanges: NT Achilles: NT Plantar Fascia: roughly nt Fat Pad: NT Peroneals: TTP along their course Post Tib: TTP along course Great Toe: Nml motion Ant Drawer: neg ATFL: NT CFL: NT Deltoid: NT Sensation: intact   Radiology: Mr Foot Left Wo Contrast  06/04/2015  CLINICAL DATA:  Progressive left foot pain since an injury 2 months ago. EXAM: MRI OF THE LEFT HINDFOOT AND MIDFOOT WITHOUT CONTRAST TECHNIQUE: Multiplanar, multisequence MR imaging was performed. No intravenous contrast was administered. COMPARISON:  Radiographs dated 05/21/2015 FINDINGS: TENDONS Peroneal: There is slight tenosynovitis at the level of the lateral malleolus. Posteromedial: There is slight tenosynovitis of the posterior tibialis tendon with marked degeneration of the segment of the tendon that inserts on the navicular with edema in the adjacent soft tissues. Anterior: Normal. Achilles: Normal. Plantar Fascia: There is slight degeneration and thickening of the origin of the medial band of the plantar fascia but there is no evidence of active inflammation of the plantar fascia. LIGAMENTS Lateral:  Normal. Medial: Normal. CARTILAGE Ankle Joint: Normal. Subtalar Joints/Sinus Tarsi: Normal. Bones: Small degenerative cyst in the dorsal lateral aspect of the middle cuneiform. Otherwise, normal. IMPRESSION: Focal degeneration inflammation of the posterior tibialis tendon just proximal to the insertion on the navicular. Tenosynovitis of the peroneal tendons. Electronically Signed   By: Lorriane Shire M.D.   On: 06/04/2015 07:51   Dg Foot Complete Left  05/21/2015  CLINICAL DATA:  LEFT foot pain, stepped off a step and felt a pop, question posterior tibialis tendon injury, assess navicular and midfoot EXAM: LEFT FOOT - COMPLETE 3+ VIEW COMPARISON:  None. FINDINGS: Osseous mineralization normal. Joint spaces preserved. Plantar and Achilles insertion calcaneal spurs. No acute fracture, dislocation or bone destruction. IMPRESSION: Mild calcaneal spurring. No acute osseous abnormalities. Electronically Signed   By: Lavonia Dana M.D.   On: 05/21/2015 14:56    Assessment and  Plan:   Left tibialis posterior tendinitis - Plan: Ambulatory referral to Physical Therapy  Left peroneal tendinosis - Plan: Ambulatory referral to Physical Therapy  Improving, transition to an ASO brace and begin PT.  Follow-up: Return in about 4 weeks (around 07/16/2015).  New Prescriptions   No medications on file   Modified Medications   No medications on file   Orders Placed This Encounter  Procedures  . Ambulatory referral to Physical Therapy    Signed,  Frederico Hamman T. Gordana Kewley, MD   Patient's Medications  New Prescriptions   No medications on file  Previous Medications   PROVENTIL HFA 108 (90 BASE) MCG/ACT INHALER    Inhale 2 puffs into the lungs daily as needed for shortness of breath.   Modified Medications   No medications on file  Discontinued Medications   MELOXICAM (MOBIC) 7.5 MG TABLET    Take 1 tablet (7.5 mg total) by mouth daily.   PREDNISONE (DELTASONE) 20 MG TABLET    2 tabs po for 5 days, then 1  tab po for 5 days

## 2015-06-18 NOTE — Progress Notes (Signed)
Pre visit review using our clinic review tool, if applicable. No additional management support is needed unless otherwise documented below in the visit note. 

## 2015-06-18 NOTE — Patient Instructions (Signed)
Wear your ASO ankle brace for the next 2 weeks, then start to taper out of it.  It is ok to wear it if you are up on your feet a lot.  REFERRALS TO SPECIALISTS, SPECIAL TESTS (MRI, CT, ULTRASOUNDS)  [facility] or [facility] will help you. ASK CHECK-IN FOR HELP.  Imaging / Special Testing referrals sometimes can be done same day if EMERGENCY, but others can take 2 or 3 days to get an appointment. Starting in 2015, many of the new Medicare plans and Obamacare plans take much longer.   Specialist appointment times vary a great deal, based on their schedule / openings. -- Some specialists have very long wait times. (Example. Dermatology. Multiple months  for non-cancer)

## 2015-07-16 ENCOUNTER — Encounter: Payer: Self-pay | Admitting: Family Medicine

## 2015-07-16 ENCOUNTER — Ambulatory Visit (INDEPENDENT_AMBULATORY_CARE_PROVIDER_SITE_OTHER): Payer: BLUE CROSS/BLUE SHIELD | Admitting: Family Medicine

## 2015-07-16 VITALS — BP 108/82 | HR 83 | Temp 98.3°F | Ht 64.0 in | Wt 248.5 lb

## 2015-07-16 DIAGNOSIS — M7672 Peroneal tendinitis, left leg: Secondary | ICD-10-CM | POA: Diagnosis not present

## 2015-07-16 DIAGNOSIS — M76822 Posterior tibial tendinitis, left leg: Secondary | ICD-10-CM

## 2015-07-16 DIAGNOSIS — M6788 Other specified disorders of synovium and tendon, other site: Secondary | ICD-10-CM

## 2015-07-16 NOTE — Progress Notes (Signed)
Dr. Frederico Hamman T. Mechele Kittleson, MD, [facility] and Sports Medicine [place], [address] Phone: [phone] Fax: [phone]  07/16/2015  Patient: Kristina Savage, MRN: [id number], DOB: [birth year], 54 y.o.  Primary Physician:  Arnette Norris, MD   Chief Complaint  Patient presents with  . Follow-up    Left Foot   Subjective:   Kristina Savage is a 54 y.o. very pleasant female patient who presents with the following:  F/u L PT and peroneal tendinopathy:   L sided and much better. Very minimal PT tenderness now, o/w she is doing ok. Compliant with HEP and been to 6 PT visits.   06/18/2015 Last OV with Owens Loffler, MD  F/u L PT and peroneal tendinopathy:  ? Tendon injury initially  She is doing quite a bit better compared to last time I saw her.  She has been ordered cam walker boot, and I did review her MRI with her over the phone.  There is no evidence for fracture, and there is significant involvement and tendinopathy of both the peroneal and posterior tibialis tendons.  05/28/2015 Last OV with Owens Loffler, MD  The patient has had a dramatic turn for the worse, and I saw her only one week ago.  At that point I thought that she primarily had a plantar fascia partial rupture, but she is had significant, and dramatic  Increase in her pain in the last few days.  She is having difficulty ambulating now, and she actually went to the emergency department last evening.  She does have a short pneumatic cam walker boot, which is helped somewhat, but now her foot is hurting to the degree where she is having trouble regulating.  There is also continuing some swelling  05/21/2015 Last OV with Owens Loffler, MD  Consulting MD: Dr. Deborra Medina Reason: L foot pain x 1 month  Very pleasnt patient stepped off of her bottom step and stepped incorrectly and felt a pop and two days later, her whole foot was swollen. Ever since then she has been having quite a bit of  difficulty plantar flexing her foot and her gait is been altered.  She is having pain on the plantar aspect of her foot, and she also is having some pain medially.  She can point to exactly where it is having problems and pain.  She currently does not have any bruising.  Elevated, did some ice. Went down some over the last month.  She also has done basic over-the-counter medication such as Tylenol and NSAIDs.  She also bought a ankle brace which seems did not really help all that much.  Past Medical History, Surgical History, Social History, Family History, Problem List, Medications, and Allergies have been reviewed and updated if relevant.  Patient Active Problem List   Diagnosis Date Noted  . Sinusitis, acute frontal 05/14/2015  . Diverticulitis of colon 08/13/2014  . Ovarian cyst 08/13/2014  . Abdominal pain, left lower quadrant 08/13/2014    Past Medical History  Diagnosis Date  . Genital warts   . Chicken pox   . Hay fever   . UTI (lower urinary tract infection)     Past Surgical History  Procedure Laterality Date  . Cervix removal      Laser surgery, not complete removal  . Cesarean section      Social History   Social History  . Marital Status: Married    Spouse Name: N/A  . Number of Children:  4  . 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 Smoking status: Current Every Day Smoker -- 0.50 packs/day    Types: Cigarettes  . Smokeless tobacco: Never Used  . Alcohol Use: 0.0 oz/week    0 Standard drinks or equivalent per week     Comment: occasional  . Drug Use: No  . Sexual Activity: Yes    Birth Control/ Protection: None   Other Topics Concern  . Not on file   Social History Narrative    Family History  Problem Relation Age of Onset  . Arthritis Mother   . Hypertension Mother   . Arthritis Father   . Diabetes Son   . Cancer Maternal Aunt   . Diabetes Paternal Grandmother     Allergies  Allergen  Reactions  . Percocet [Oxycodone-Acetaminophen] Nausea And Vomiting    Medication list reviewed and updated in full in [facility].  GEN: No fevers, chills. Nontoxic. Primarily MSK c/o today. MSK: Detailed in the HPI GI: tolerating PO intake without difficulty Neuro: No numbness, parasthesias, or tingling associated. Otherwise the pertinent positives of the ROS are noted above.   Objective:   BP 108/82 mmHg  Pulse 83  Temp(Src) 98.3 F (36.8 C) (Oral)  Ht 5\' 4"  (1.626 m)  Wt 248 lb 8 oz (112.719 kg)  BMI 42.63 kg/m2   GEN: WDWN, NAD, Non-toxic, Alert & Oriented x 3 HEENT: Atraumatic, Normocephalic.  Ears and Nose: No external deformity. EXTR: No clubbing/cyanosis/edema NEURO: normal sensation. neurovasc intact.  PSYCH: Normally interactive. Conversant. Not depressed or anxious appearing.  Calm demeanor.   FEET: B Echymosis: no Edema: none ROM: full LE B Gait: heel toe, improved MT pain: no Callus pattern: none Lateral Mall: NT Medial Mall: NT Midfoot pain improved Metatarsals: NT 5th MT: NT Phalanges: NT Achilles: NT Plantar Fascia: roughly nt Fat Pad: NT Peroneals: NT Post Tib: minimally tender Great Toe: Nml motion Ant Drawer: neg ATFL: NT CFL: NT Deltoid: NT Sensation: intact   Radiology: No results found.   Assessment and Plan:   Left tibialis posterior tendinitis  Left peroneal tendinosis  She has done well and is dramatically improved over time.  Follow-up: prn  Signed,  Alaney Witter T. Joniah Bednarski, MD   Patient's Medications  New Prescriptions   No medications on file  Previous Medications   PROVENTIL HFA 108 (90 BASE) MCG/ACT INHALER    Inhale 2 puffs into the lungs daily as needed for shortness of breath.   Modified Medications   No medications on file  Discontinued Medications   No medications on file

## 2015-07-16 NOTE — Progress Notes (Signed)
Pre visit review using our clinic review tool, if applicable. No additional management support is needed unless otherwise documented below in the visit note. 

## 2016-01-29 ENCOUNTER — Ambulatory Visit (INDEPENDENT_AMBULATORY_CARE_PROVIDER_SITE_OTHER): Payer: BLUE CROSS/BLUE SHIELD | Admitting: Family Medicine

## 2016-01-29 ENCOUNTER — Encounter: Payer: Self-pay | Admitting: Family Medicine

## 2016-01-29 VITALS — BP 118/80 | HR 91 | Temp 98.0°F | Ht 64.0 in | Wt 245.1 lb

## 2016-01-29 DIAGNOSIS — G479 Sleep disorder, unspecified: Secondary | ICD-10-CM

## 2016-01-29 DIAGNOSIS — Z634 Disappearance and death of family member: Secondary | ICD-10-CM

## 2016-01-29 DIAGNOSIS — F4321 Adjustment disorder with depressed mood: Secondary | ICD-10-CM | POA: Diagnosis not present

## 2016-01-29 MED ORDER — LORAZEPAM 0.5 MG PO TABS: 0.5000 mg | ORAL_TABLET | Freq: Two times a day (BID) | ORAL | Status: DC | PRN

## 2016-01-29 NOTE — Progress Notes (Signed)
Subjective:    Patient ID: Kristina Savage, female    DOB: 1961/04/08, 55 y.o.   MRN: [id number]  HPI  [name] is a 55 year old female who presents today for anxiety and sleeplessness following the death of her mother who died two weeks ago. She reports being the primary caregiver for her mother and states that she feels "lost without her."  She is experiencing symptoms of anxiety and appetite changes with loss of sleep that is most disturbing to her. She denies history of depression or anxious mood and notes this as situational after recent loss of her mother. She denies chest pain, palpitations, SOB, dyspnea, numbness, tingling, weakness, or headaches. She does not take any medications at this time and is due for a physical with her PCP.  Review of Systems  Constitutional: Negative for fever and chills.  Respiratory: Negative for cough and wheezing.   Cardiovascular: Negative for chest pain, palpitations and leg swelling.  Gastrointestinal: Negative for nausea, vomiting, abdominal pain, diarrhea and constipation.  Genitourinary: Negative for dysuria, urgency and frequency.  Musculoskeletal: Negative for myalgias.  Neurological: Negative for dizziness, light-headedness, numbness and headaches.  Psychiatric/Behavioral: Positive for sleep disturbance.       Denies depressed or anxious mood previously but notes that this is situational after death of her mother. She denies suicidal ideation or plan   Past Medical History  Diagnosis Date  . Genital warts   . Chicken pox   . Hay fever   . UTI (lower urinary tract infection)      Social History   Social History  . Marital Status: Married    Spouse Name: N/A  . Number of Children: 4  . 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 Smoking status: Current Every Day Smoker -- 0.50 packs/day    Types: Cigarettes  . Smokeless tobacco: Never Used  . Alcohol Use: 0.0 oz/week    0 Standard  drinks or equivalent per week     Comment: occasional  . Drug Use: No  . Sexual Activity: Yes    Birth Control/ Protection: None   Other Topics Concern  . Not on file   Social History Narrative    Past Surgical History  Procedure Laterality Date  . Cervix removal      Laser surgery, not complete removal  . Cesarean section      Family History  Problem Relation Age of Onset  . Arthritis Mother   . Hypertension Mother   . Arthritis Father   . Diabetes Son   . Cancer Maternal Aunt   . Diabetes Paternal Grandmother     Allergies  Allergen Reactions  . Percocet [Oxycodone-Acetaminophen] Nausea And Vomiting    Current Outpatient Prescriptions on File Prior to Visit  Medication Sig Dispense Refill  . PROVENTIL HFA 108 (90 BASE) MCG/ACT inhaler Inhale 2 puffs into the lungs daily as needed for shortness of breath.   0   No current facility-administered medications on file prior to visit.    BP 118/80 mmHg  Pulse 91  Temp(Src) 98 F (36.7 C) (Oral)  Ht 5\' 4"  (1.626 m)  Wt 245 lb 1.6 oz (111.177 kg)  BMI 42.05 kg/m2        Objective:   Physical Exam  Constitutional: She is oriented to person, place, and time. She appears well-developed and well-nourished.  Eyes: Pupils are equal, round, and reactive to light. No scleral  icterus.  Cardiovascular: Normal rate and regular rhythm.   Pulmonary/Chest: Effort normal and breath sounds normal. She has no wheezes. She has no rales.  Neurological: She is alert and oriented to person, place, and time. Coordination normal.  Skin: Skin is warm and dry. No rash noted.  Psychiatric: She has a normal mood and affect. Her behavior is normal. Judgment and thought content normal.  Patient crying when talking about the recent loss of her mother.       Assessment & Plan:  1. Loss or death of parent Discussed with patient symptoms of grieving and also counseling options that are available to her. Provided pamphlet for [facility] and counseling and recommended patient review the material and she can contact [facility] for an appointment if she decides on this option.  2. Grieving   3. Sleep disturbance Provided short term treatment to assist with sleep and anxiety during the grieving process. Discussed with patient that this is a short term solution and recommended counseling as stated above. Further advised her to follow up with her PCP if symptoms persist or she needs additional evaluation and treatment. Patient voiced understanding and agreed with plan.  - LORazepam (ATIVAN) 0.5 MG tablet; Take 1 tablet (0.5 mg total) by mouth 2 (two) times daily as needed for anxiety.  Dispense: 30 tablet; Refill: 1  Delano Metz, FNP-C

## 2016-01-29 NOTE — Patient Instructions (Signed)
Complicated Grieving Grief is a normal response to the death of someone close to you. Feelings of fear, anger, and guilt can affect almost everyone who loses a loved one. It is also common to have symptoms of depression while you are grieving. These include problems with sleep, loss of appetite, and lack of energy. They may last for weeks or months after a loss. Complicated grief is different from normal grief or depression. Normal grieving involves sadness and feelings of loss, but these feelings are not constant. Complicated grief is a constant and severe type of grief. It interferes with your ability to function normally. It may last for several months to a year or longer. Complicated grief may require treatment from a mental health care provider. CAUSES  It is not known why some people continue to struggle with grief and others do not. You may be at higher risk for complicated grief if:  The death of your loved one was sudden or unexpected.  The death of your loved one was due to a violent event.  Your loved one committed suicide.  Your loved one was a child or a young person.  You were very close to or dependent on the loved one.  You have a history of depression. SIGNS AND SYMPTOMS Signs and symptoms of complicated grief may include:  Feeling disbelief or numbness.  Being unable to enjoy good memories of your loved one.  Needing to avoid anything that reminds you of your loved one.  Being unable to stop thinking about the death.  Feeling intense anger or guilt.  Feeling alone and hopeless.  Feeling that your life is meaningless and empty.  Losing the desire to live. DIAGNOSIS Your health care provider may diagnose complicated grief if:  You have constant symptoms of grief for 6-12 months or longer.  Your symptoms are interfering with your ability to live your life. Your health care provider may want you to see a mental health care provider. Many symptoms of depression  are similar to the symptoms of complicated grief. It is important to be evaluated for complicated grief along with other mental health conditions. TREATMENT  Talk therapy with a mental health provider is the most common treatment for complicated grief. During therapy, you will learn healthy ways to cope with the loss of your loved one. In some cases, your mental health care provider may also recommend antidepressant medicines. HOME CARE INSTRUCTIONS  Take care of yourself.  Eat regular meals and maintain a healthy diet. Eat plenty of fruits, vegetables, and whole grains.  Try to get some exercise each day.  Keep regular hours for sleep. Try to get at least 8 hours of sleep each night.  Do not use drugs or alcohol to ease your symptoms.  Take medicines only as directed by your health care provider.  Spend time with friends and loved ones.  Consider joining a grief (bereavement) support group to help you deal with your loss.  Keep all follow-up visits as directed by your health care provider. This is important. SEEK MEDICAL CARE IF:  Your symptoms keep you from functioning normally.  Your symptoms do not get better with treatment. SEEK IMMEDIATE MEDICAL CARE IF:  You have serious thoughts of hurting yourself or someone else.  You have suicidal feelings.   This information is not intended to replace advice given to you by your health care provider. Make sure you discuss any questions you have with your health care provider.   Document Released: 07/12/2005   Document Revised: 04/02/2015 Document Reviewed: 12/20/2013 Elsevier Interactive Patient Education 2016 [employer]vier Inc[employer]  

## 2016-02-06 ENCOUNTER — Telehealth: Payer: Self-pay

## 2016-02-06 NOTE — Telephone Encounter (Signed)
I am so sorry for her loss but anything that is as needed/short acting is in the same family as lorazepam and has similar side effects.  We could consider a daily medication but I really need her to come in to discuss.  Would she like [place] to call in a lower dose of lorazepam?

## 2016-02-06 NOTE — Telephone Encounter (Signed)
Pt was seen 01/29/16; pt took lorazepam 0.5 mg 1/2 tab on 02/02/16 and felt drunk; pt could not work. Today pt took 1/4 pill of Lorazepam 0.5 mg and pt feels very shaky; pt said she wants a med that will keep her from crying due to recent death of pts mother so pt can go back to work. Pt request different med that is milder to [facility]. Pt request cb. No SI/HI.

## 2016-02-06 NOTE — Telephone Encounter (Signed)
Attempted to contact pt; line busy 

## 2016-02-09 NOTE — Telephone Encounter (Signed)
Lm on pts vm requesting a call back 

## 2016-12-15 IMAGING — CR DG FOOT COMPLETE 3+V*L*
3 series · 3 of 3 positions shown · non-contrast
Comparison: None.

CLINICAL DATA: LEFT foot pain, stepped off a step and felt a pop,
question posterior tibialis tendon injury, assess navicular and
midfoot

EXAM:
LEFT FOOT - COMPLETE 3+ VIEW

[view not recorded (1 of 3)]
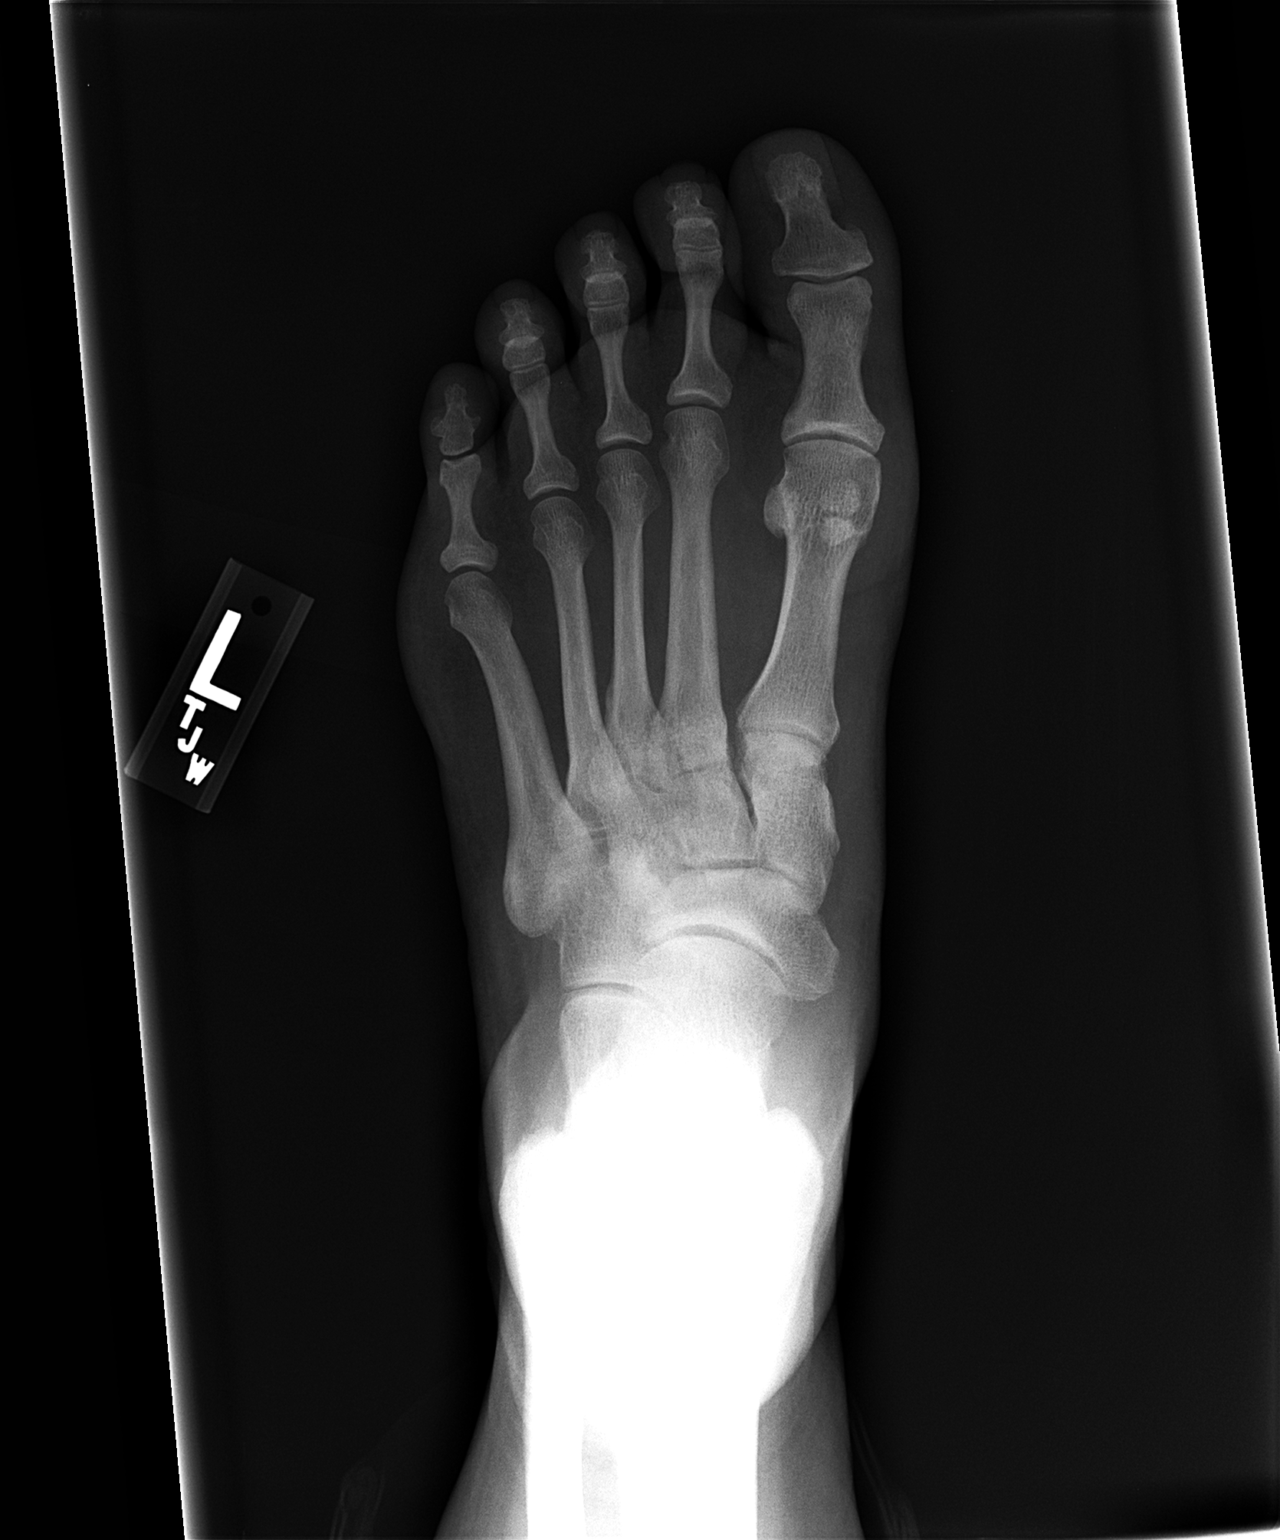

[view not recorded (2 of 3)]
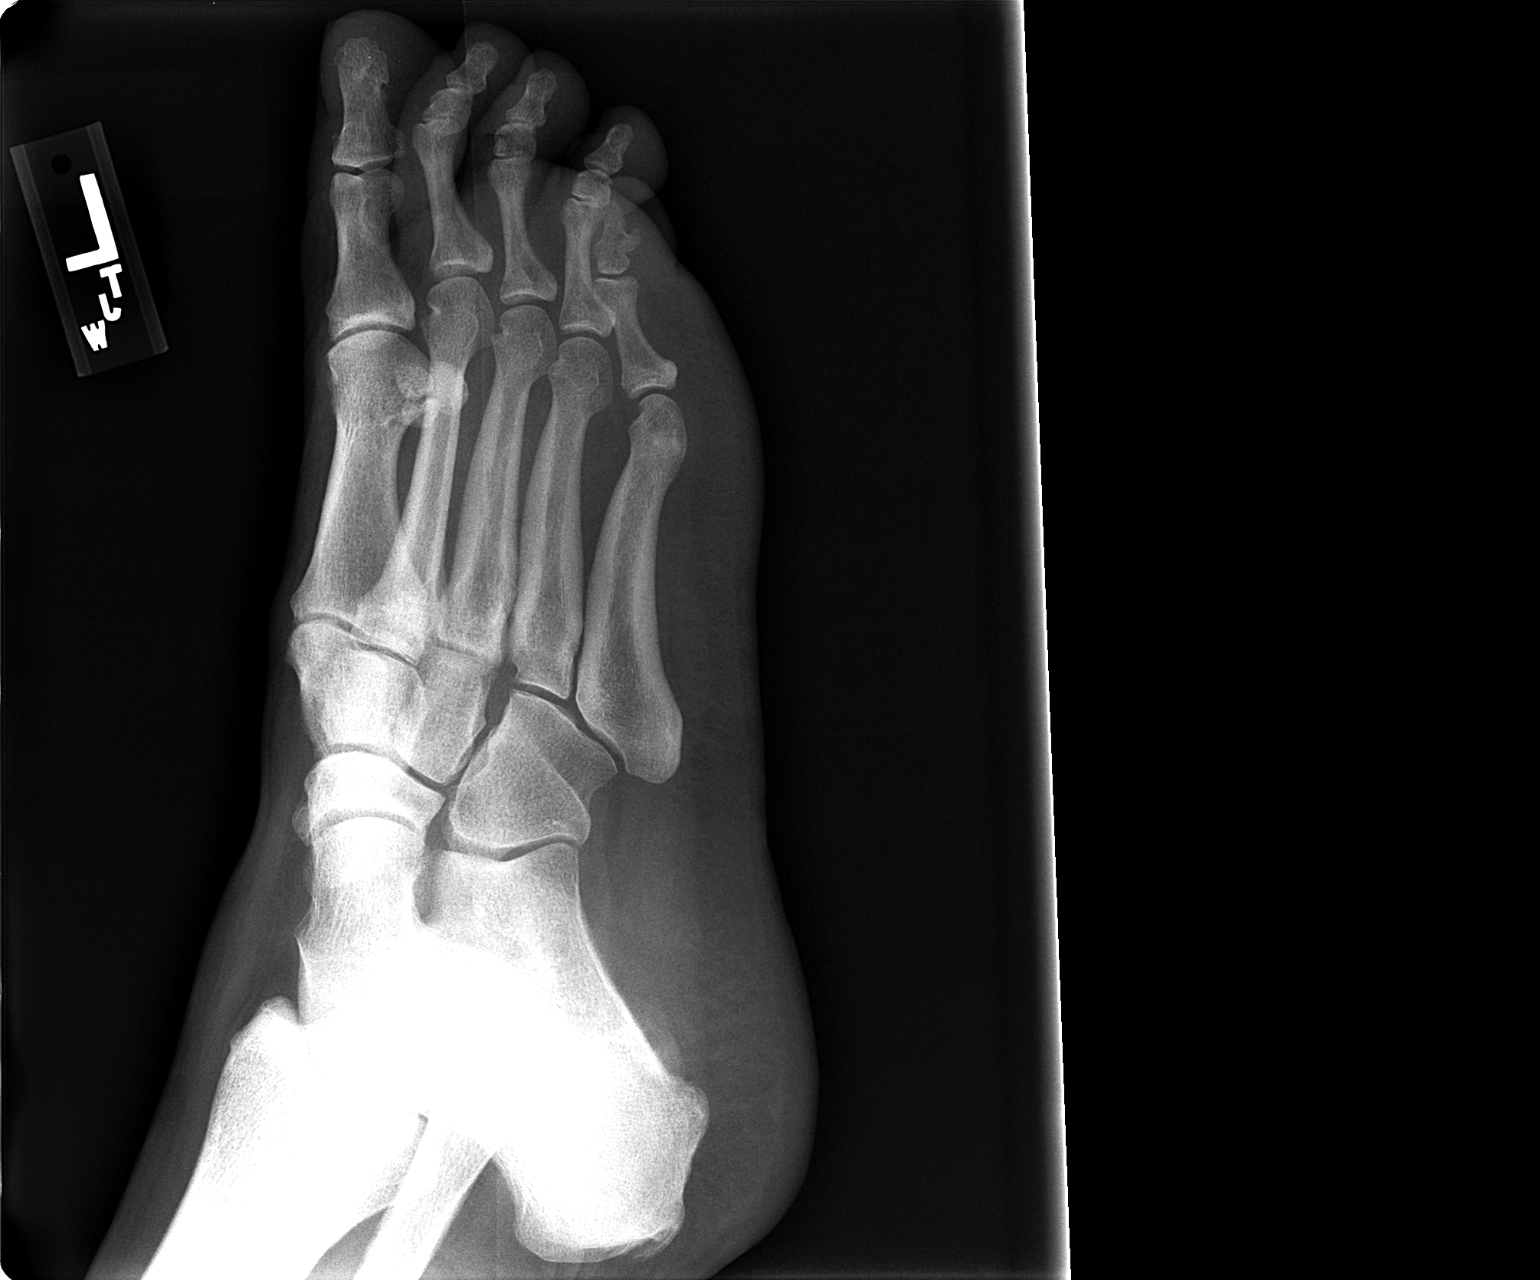

[view not recorded (3 of 3)]
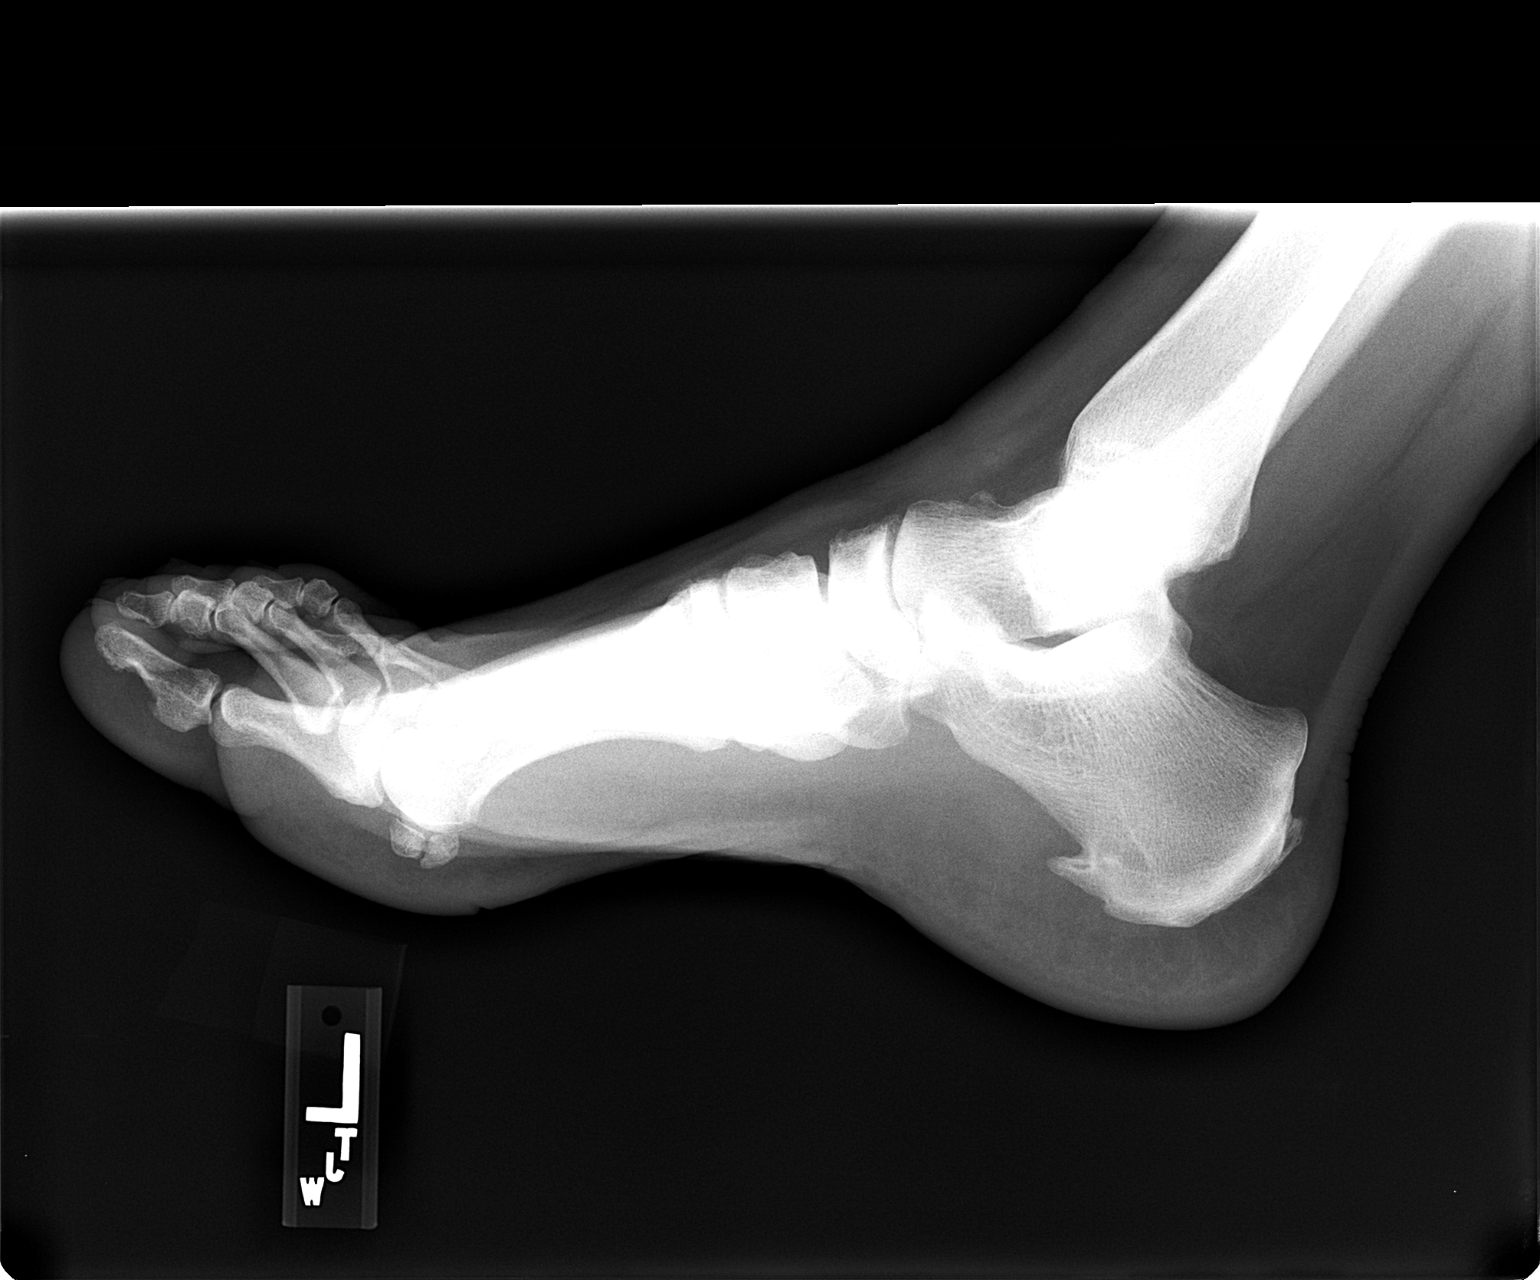

[3 of 3 positions shown; findings below may reference images not displayed]

FINDINGS: Osseous mineralization normal.

Joint spaces preserved.

Plantar and Achilles insertion calcaneal spurs.

No acute fracture, dislocation or bone destruction.
IMPRESSION: Mild calcaneal spurring.

No acute osseous abnormalities.

## 2019-04-17 ENCOUNTER — Other Ambulatory Visit: Payer: Self-pay

## 2019-04-17 DIAGNOSIS — Z20822 Contact with and (suspected) exposure to covid-19: Secondary | ICD-10-CM

## 2019-04-18 LAB — NOVEL CORONAVIRUS, NAA: SARS-CoV-2, NAA: NOT DETECTED

## 2019-05-01 ENCOUNTER — Ambulatory Visit (HOSPITAL_COMMUNITY)
Admission: EM | Admit: 2019-05-01 | Discharge: 2019-05-01 | Disposition: A | Discharge status: Home / Self Care | Payer: Self-pay | Attending: Urgent Care | Admitting: Urgent Care

## 2019-05-01 ENCOUNTER — Encounter (HOSPITAL_COMMUNITY): Payer: Self-pay

## 2019-05-01 ENCOUNTER — Other Ambulatory Visit: Payer: Self-pay

## 2019-05-01 DIAGNOSIS — R519 Headache, unspecified: Secondary | ICD-10-CM

## 2019-05-01 DIAGNOSIS — K047 Periapical abscess without sinus: Secondary | ICD-10-CM

## 2019-05-01 DIAGNOSIS — K029 Dental caries, unspecified: Secondary | ICD-10-CM

## 2019-05-01 MED ORDER — NAPROXEN 500 MG PO TABS: 500.0000 mg | ORAL_TABLET | Freq: Two times a day (BID) | ORAL | 0 refills | Status: DC

## 2019-05-01 MED ORDER — LIDOCAINE VISCOUS HCL 2 % MT SOLN: 15.0000 mL | Freq: Three times a day (TID) | OROMUCOSAL | 0 refills | Status: DC | PRN

## 2019-05-01 MED ORDER — AMOXICILLIN-POT CLAVULANATE 875-125 MG PO TABS: 1.0000 | ORAL_TABLET | Freq: Two times a day (BID) | ORAL | 0 refills | Status: DC

## 2019-05-01 MED ORDER — CHLORHEXIDINE GLUCONATE 0.12 % MT SOLN: OROMUCOSAL | 0 refills | Status: DC

## 2019-05-01 NOTE — Discharge Instructions (Signed)
[facility] [phone] extension [phone]  Dr. Civils [phone] [address]  [facility] [phone] [address]  [facility] [phone] extension [phone] [place], [place], [address] First come first serve for the first 10 clients.  May do simple extractions only, no wisdom teeth or surgery.  You may try the second for Thursday of the month starting at 6:30 AM.  [employer] You may call the school to see if they are still helping to provide dental care for emergent cases.  

## 2019-05-01 NOTE — ED Provider Notes (Signed)
MRN: [id number] DOB: January 15, 1961  Subjective:   Kristina Savage is a 58 y.o. female presenting for 2 day hx acute onset worsening constant moderate-severe pain of left upper teeth now having facial swelling/pain, mild headache. Has used left over amoxicillin from her son's script (had 2 doses). Also tried crushing aspirin and ibuprofen and rubbing it on her teeth. Does not have a dentist currently.  Denies taking chronic medications.   Allergies  Allergen Reactions  . Percocet [Oxycodone-Acetaminophen] Nausea And Vomiting    Past Medical History:  Diagnosis Date  . Chicken pox   . Genital warts   . Hay fever   . UTI (lower urinary tract infection)      Past Surgical History:  Procedure Laterality Date  . CERVIX REMOVAL     Laser surgery, not complete removal  . CESAREAN SECTION      Review of Systems  Constitutional: Positive for malaise/fatigue. Negative for fever.  HENT: Negative for congestion, ear pain, sinus pain and sore throat.   Eyes: Negative for discharge and redness.  Respiratory: Negative for cough, hemoptysis, shortness of breath and wheezing.   Cardiovascular: Negative for chest pain.  Gastrointestinal: Negative for abdominal pain, diarrhea, nausea and vomiting.  Genitourinary: Negative for dysuria, flank pain and hematuria.  Musculoskeletal: Negative for myalgias.  Skin: Negative for rash.  Neurological: Negative for dizziness, weakness and headaches.  Psychiatric/Behavioral: Negative for depression and substance abuse.    Objective:   Vitals: BP (!) 126/95 (BP Location: Left Arm)   Pulse (!) 103   Temp 98.6 F (37 C) (Oral)   Resp 18   SpO2 97%   Physical Exam Constitutional:      General: She is not in acute distress.    Appearance: Normal appearance. She is well-developed. She is not ill-appearing.  HENT:     Head: Normocephalic and atraumatic.      Right Ear: Tympanic membrane and ear canal normal. No drainage or tenderness. No middle ear  effusion. Tympanic membrane is not erythematous.     Left Ear: Tympanic membrane and ear canal normal. No drainage or tenderness.  No middle ear effusion. Tympanic membrane is not erythematous.     Nose: Nose normal. No congestion or rhinorrhea.     Mouth/Throat:     Mouth: Mucous membranes are moist. No oral lesions.     Pharynx: Oropharynx is clear. No pharyngeal swelling, oropharyngeal exudate, posterior oropharyngeal erythema or uvula swelling.     Tonsils: No tonsillar exudate or tonsillar abscesses.   Eyes:     General: No scleral icterus.    Extraocular Movements: Extraocular movements intact.     Right eye: Normal extraocular motion.     Left eye: Normal extraocular motion.     Conjunctiva/sclera: Conjunctivae normal.     Pupils: Pupils are equal, round, and reactive to light.  Neck:     Musculoskeletal: Normal range of motion and neck supple.  Cardiovascular:     Rate and Rhythm: Normal rate.  Pulmonary:     Effort: Pulmonary effort is normal.  Lymphadenopathy:     Cervical: No cervical adenopathy.  Skin:    General: Skin is warm and dry.  Neurological:     General: No focal deficit present.     Mental Status: She is alert and oriented to person, place, and time.  Psychiatric:        Mood and Affect: Mood normal.        Behavior: Behavior normal.  Assessment and Plan :   1. Dental infection   2. Pain due to dental caries   3. Facial pain     Start Augmentin for dental abscess, will use chlorhexidine rinse as well for topical management. Use naproxen for pain and inflammation. Emphasized need for dental surgeon consult. Counseled patient on potential for adverse effects with medications prescribed/recommended today, strict ER and return-to-clinic precautions discussed, patient verbalized understanding.    Jaynee Eagles, PA-C 05/01/19 1012

## 2019-05-01 NOTE — ED Triage Notes (Addendum)
Pt report having dental pain, mild headache and left side of the face swollen x 2 days. Pt took Amoxicillin yesterday and chewed aspirin, without any improvement in her symptoms.

## 2019-10-12 ENCOUNTER — Encounter (HOSPITAL_COMMUNITY): Payer: Self-pay

## 2019-10-12 ENCOUNTER — Ambulatory Visit (INDEPENDENT_AMBULATORY_CARE_PROVIDER_SITE_OTHER): Payer: Self-pay

## 2019-10-12 ENCOUNTER — Ambulatory Visit (HOSPITAL_COMMUNITY)
Admission: EM | Admit: 2019-10-12 | Discharge: 2019-10-12 | Disposition: A | Discharge status: Home / Self Care | Payer: Self-pay | Attending: Internal Medicine | Admitting: Internal Medicine

## 2019-10-12 ENCOUNTER — Other Ambulatory Visit: Payer: Self-pay

## 2019-10-12 DIAGNOSIS — S83411A Sprain of medial collateral ligament of right knee, initial encounter: Secondary | ICD-10-CM

## 2019-10-12 DIAGNOSIS — M25561 Pain in right knee: Secondary | ICD-10-CM

## 2019-10-12 MED ORDER — IBUPROFEN 600 MG PO TABS: 600.0000 mg | ORAL_TABLET | Freq: Four times a day (QID) | ORAL | 0 refills | Status: DC | PRN

## 2019-10-12 NOTE — ED Provider Notes (Signed)
[facility]    CSN: [id number] Arrival date & time: 10/12/19  0820      History   Chief Complaint Chief Complaint  Patient presents with  . Knee Injury    HPI Kristina Savage is a 59 y.o. female comes to urgent care with right knee pain and swelling over the past week.  Patient apparently sprained her right knee about a week ago while painting her house.  Since injury the patient has been using ice and heat as well as taking Motrin.  She gets partial relief with the pain but has also noticed knee swelling with increasing activity.  The pain is on the inner aspect of the knee, it is positional and is associated with swelling of the right knee.  No bruising.  She is able to bear weight on the right knee.Marland Kitchen   HPI  Past Medical History:  Diagnosis Date  . Chicken pox   . Genital warts   . Hay fever   . UTI (lower urinary tract infection)     Patient Active Problem List   Diagnosis Date Noted  . Sinusitis, acute frontal 05/14/2015  . Diverticulitis of colon 08/13/2014  . Ovarian cyst 08/13/2014  . Abdominal pain, left lower quadrant 08/13/2014    Past Surgical History:  Procedure Laterality Date  . CERVIX REMOVAL     Laser surgery, not complete removal  . CESAREAN SECTION      OB History   No obstetric history on file.      Home Medications    Prior to Admission medications   Medication Sig Start Date End Date Taking? Authorizing Provider  chlorhexidine (PERIDEX) 0.12 % solution Rinse mouth with 25mL for 30 seconds twice daily. 05/01/19   Jaynee Eagles, PA-C  ibuprofen (ADVIL) 600 MG tablet Take 1 tablet (600 mg total) by mouth every 6 (six) hours as needed. 10/12/19   Tyvion Edmondson, Myrene Galas, MD  lidocaine (XYLOCAINE) 2 % solution Use as directed 15 mLs in the mouth or throat every 8 (eight) hours as needed for mouth pain. 05/01/19   Jaynee Eagles, PA-C  naproxen (NAPROSYN) 500 MG tablet Take 1 tablet (500 mg total) by mouth 2 (two) times daily. 05/01/19   Jaynee Eagles,  PA-C  PROVENTIL HFA 108 (90 BASE) MCG/ACT inhaler Inhale 2 puffs into the lungs daily as needed for shortness of breath.  05/17/14 05/01/19  [provider]    Family History Family History  Problem Relation Age of Onset  . Arthritis Mother   . Hypertension Mother   . Arthritis Father   . Diabetes Son   . Cancer Maternal Aunt   . Diabetes Paternal Grandmother     Social History Social History   Tobacco Use  . Smoking status: Current Every Day Smoker    Packs/day: 0.50    Types: Cigarettes  . Smokeless tobacco: Never Used  Substance Use Topics  . Alcohol use: Yes    Alcohol/week: 0.0 standard drinks    Comment: occasional  . Drug use: No     Allergies   Percocet [oxycodone-acetaminophen]   Review of Systems Review of Systems  Respiratory: Negative for cough and shortness of breath.   Gastrointestinal: Negative for abdominal pain, nausea and vomiting.  Musculoskeletal: Positive for arthralgias and joint swelling. Negative for myalgias.  Skin: Negative for color change, pallor, rash and wound.  Neurological: Negative for dizziness, light-headedness and headaches.  Psychiatric/Behavioral: Negative for confusion and decreased concentration.     Physical Exam Triage  Vital Signs ED Triage Vitals  Enc Vitals Group     BP 10/12/19 0841 129/78     Pulse Rate 10/12/19 0841 68     Resp 10/12/19 0841 16     Temp 10/12/19 0841 98.3 F (36.8 C)     Temp Source 10/12/19 0841 Oral     SpO2 10/12/19 0841 99 %     Weight 10/12/19 0840 280 lb (127 kg)     Height --      Head Circumference --      Peak Flow --      Pain Score 10/12/19 0839 0     Pain Loc --      Pain Edu? --      Excl. in [place]? --    No data found.  Updated Vital Signs BP 129/78 (BP Location: Left Arm)   Pulse 68   Temp 98.3 F (36.8 C) (Oral)   Resp 16   Wt 127 kg   SpO2 99%   BMI 48.06 kg/m   Visual Acuity Right Eye Distance:   Left Eye Distance:   Bilateral Distance:    Right  Eye Near:   Left Eye Near:    Bilateral Near:     Physical Exam Vitals and nursing note reviewed.  Constitutional:      Appearance: Normal appearance.     Comments: Middle-age female with an antalgic gait  Cardiovascular:     Rate and Rhythm: Normal rate and regular rhythm.  Pulmonary:     Effort: Pulmonary effort is normal.     Breath sounds: Normal breath sounds.  Musculoskeletal:        General: Swelling and tenderness present.     Comments: Anterior drawer test is negative.  Tenderness over the medial collateral ligament of the right knee.  Skin:    General: Skin is warm.     Capillary Refill: Capillary refill takes less than 2 seconds.  Neurological:     General: No focal deficit present.     Mental Status: She is alert and oriented to person, place, and time.      UC Treatments / Results  Labs (all labs ordered are listed, but only abnormal results are displayed) Labs Reviewed - No data to display  EKG   Radiology DG Knee Complete 4 Views Right  Result Date: 10/12/2019 CLINICAL DATA:  RIGHT knee pain and swelling. Patient stepped off a table 1 week ago, causing pain in the RIGHT knee. No improvement over the last week. EXAM: RIGHT KNEE - COMPLETE 4+ VIEW COMPARISON:  None. FINDINGS: Joint effusion is present. There is no acute fracture or subluxation. IMPRESSION: Joint effusion. Electronically Signed   By: Nolon Nations M.D.   On: 10/12/2019 09:16    Procedures Procedures (including critical care time)  Medications Ordered in UC Medications - No data to display  Initial Impression / Assessment and Plan / UC Course  I have reviewed the triage vital signs and the nursing notes.  Pertinent labs & imaging results that were available during my care of the patient were reviewed by me and considered in my medical decision making (see chart for details).     1.  Sprain of the medial collateral ligament of the right knee: NSAIDs as needed for pain Knee  brace Gentle range of motion exercises Continue icing the knee If no improvement in a week please call orthopedic surgery for further evaluation.  Final Clinical Impressions(s) / UC Diagnoses   Final diagnoses:  Sprain of medial collateral ligament of right knee, initial encounter     Discharge Instructions     Please continue icing the right knee Please obtain over-the-counter knee brace for use at home Gentle range of motion exercises If no improvement in a week, please call the orthopedic surgeon with information given.    ED Prescriptions    Medication Sig Dispense Auth. Provider   ibuprofen (ADVIL) 600 MG tablet Take 1 tablet (600 mg total) by mouth every 6 (six) hours as needed. 30 tablet Taahir Grisby, Myrene Galas, MD     PDMP not reviewed this encounter.   Chase Picket, MD 10/12/19 1453

## 2019-10-12 NOTE — ED Triage Notes (Signed)
Patient reports that about a week ago she stepped off a table and when she stepped down she had pain in her right knee. Patient reports over the last week she has tried ice, heat, motrin, and rest with no relief. Reports if she stands for longer than 30 minutes her knee will swell.

## 2019-10-12 NOTE — Discharge Instructions (Signed)
Please continue icing the right knee Please obtain over-the-counter knee brace for use at home Gentle range of motion exercises If no improvement in a week, please call the orthopedic surgeon with information given.

## 2019-12-12 ENCOUNTER — Other Ambulatory Visit: Payer: Self-pay

## 2019-12-12 ENCOUNTER — Ambulatory Visit (HOSPITAL_COMMUNITY)
Admission: EM | Admit: 2019-12-12 | Discharge: 2019-12-12 | Disposition: A | Discharge status: Home / Self Care | Payer: Medicaid Other | Attending: Physician Assistant | Admitting: Physician Assistant

## 2019-12-12 ENCOUNTER — Encounter (HOSPITAL_COMMUNITY): Payer: Self-pay

## 2019-12-12 DIAGNOSIS — L0201 Cutaneous abscess of face: Secondary | ICD-10-CM

## 2019-12-12 MED ORDER — TETRACAINE HCL 0.5 % OP SOLN
OPHTHALMIC | Status: AC
  Filled 2019-12-12: qty 4

## 2019-12-12 MED ORDER — DOXYCYCLINE HYCLATE 100 MG PO CAPS
100.0000 mg | ORAL_CAPSULE | Freq: Two times a day (BID) | ORAL | 0 refills | Status: DC
Start: 2019-12-12 — End: 2020-04-14

## 2019-12-12 MED ORDER — LIDOCAINE-EPINEPHRINE (PF) 2 %-1:200000 IJ SOLN
INTRAMUSCULAR | Status: AC
  Filled 2019-12-12: qty 20

## 2019-12-12 NOTE — ED Provider Notes (Signed)
[facility]    CSN: [id number] Arrival date & time: 12/12/19  1029      History   Chief Complaint Chief Complaint  Patient presents with  . Facial Swelling    HPI Kristina Savage is a 59 y.o. female.   Patient reports for evaluation of abscess of her left thigh.  She reports she woke up with this about 1 week ago.  She reports she had a swelling that hurt quite a bit above the left eye.  She reports there was surrounding redness and swelling around her eye.  She reports is very painful.  She reports putting cool compresses and taking Augmentin that she was prescribed for dental infection.  She reports after taking this for a few days she had improvement in the redness surrounding the eye the swelling just above the left eye retained.  She does report some mild drainage from this.  She reports it continues to be tender.  She denies any change in her vision.  She denies it hurts to move her eyes.  Denies any fevers or chills.     Past Medical History:  Diagnosis Date  . Chicken pox   . Genital warts   . Hay fever   . UTI (lower urinary tract infection)     Patient Active Problem List   Diagnosis Date Noted  . Sinusitis, acute frontal 05/14/2015  . Diverticulitis of colon 08/13/2014  . Ovarian cyst 08/13/2014  . Abdominal pain, left lower quadrant 08/13/2014    Past Surgical History:  Procedure Laterality Date  . CERVIX REMOVAL     Laser surgery, not complete removal  . CESAREAN SECTION      OB History   No obstetric history on file.      Home Medications    Prior to Admission medications   Medication Sig Start Date End Date Taking? Authorizing Provider  chlorhexidine (PERIDEX) 0.12 % solution Rinse mouth with 44mL for 30 seconds twice daily. 05/01/19   Jaynee Eagles, PA-C  doxycycline (VIBRAMYCIN) 100 MG capsule Take 1 capsule (100 mg total) by mouth 2 (two) times daily. 12/12/19   Selestino Nila, Marguerita Beards, PA-C  ibuprofen (ADVIL) 600 MG tablet Take 1 tablet (600 mg  total) by mouth every 6 (six) hours as needed. 10/12/19   Lamptey, Myrene Galas, MD  lidocaine (XYLOCAINE) 2 % solution Use as directed 15 mLs in the mouth or throat every 8 (eight) hours as needed for mouth pain. 05/01/19   Jaynee Eagles, PA-C  naproxen (NAPROSYN) 500 MG tablet Take 1 tablet (500 mg total) by mouth 2 (two) times daily. 05/01/19   Jaynee Eagles, PA-C  PROVENTIL HFA 108 (90 BASE) MCG/ACT inhaler Inhale 2 puffs into the lungs daily as needed for shortness of breath.  05/17/14 05/01/19  [provider]    Family History Family History  Problem Relation Age of Onset  . Arthritis Mother   . Hypertension Mother   . Arthritis Father   . Diabetes Son   . Cancer Maternal Aunt   . Diabetes Paternal Grandmother     Social History Social History   Tobacco Use  . Smoking status: Current Every Day Smoker    Packs/day: 0.50    Types: Cigarettes  . Smokeless tobacco: Never Used  Substance Use Topics  . Alcohol use: Yes    Alcohol/week: 0.0 standard drinks    Comment: occasional  . Drug use: No     Allergies   Oxycodone-acetaminophen, Percocet [oxycodone-acetaminophen], and Pollen extract  Review of Systems Review of Systems   Physical Exam Triage Vital Signs ED Triage Vitals  Enc Vitals Group     BP 12/12/19 1146 (!) 154/99     Pulse Rate 12/12/19 1146 87     Resp 12/12/19 1146 18     Temp 12/12/19 1146 97.9 F (36.6 C)     Temp Source 12/12/19 1146 Oral     SpO2 12/12/19 1146 99 %     Weight --      Height --      Head Circumference --      Peak Flow --      Pain Score 12/12/19 1145 6     Pain Loc --      Pain Edu? --      Excl. in [place]? --    No data found.  Updated Vital Signs BP (!) 154/99 (BP Location: Right Wrist)   Pulse 87   Temp 97.9 F (36.6 C) (Oral)   Resp 18   SpO2 99%   Visual Acuity Right Eye Distance: 20/25 Left Eye Distance: 20/40 Bilateral Distance: 20/20  Right Eye Near:   Left Eye Near:    Bilateral Near:     Physical  Exam Vitals and nursing note reviewed.  Constitutional:      General: She is not in acute distress.    Appearance: She is well-developed. She is not ill-appearing or diaphoretic.  HENT:     Head: Normocephalic and atraumatic.  Eyes:     Conjunctiva/sclera: Conjunctivae normal.     Comments: There is approximately a 1.5 cm in diameter abscess just to the above the left in the eyebrow.  Appears to be over the orbit and not extending into the orbital socket.  There is no surrounding erythema.  Extraocular movements intact without pain.  No scleral injection.  Vision grossly intact  Cardiovascular:     Rate and Rhythm: Normal rate and regular rhythm.  Pulmonary:     Effort: Pulmonary effort is normal. No respiratory distress.  Musculoskeletal:     Cervical back: Neck supple.  Skin:    General: Skin is warm and dry.  Neurological:     Mental Status: She is alert.      UC Treatments / Results  Labs (all labs ordered are listed, but only abnormal results are displayed) Labs Reviewed - No data to display  EKG   Radiology No results found.  Procedures Incision and Drainage  Date/Time: 12/12/2019 10:08 PM Performed by: Vanessa Kick, MD Authorized by: Purnell Shoemaker, PA-C   Consent:    Consent obtained:  Verbal   Consent given by:  Patient   Risks discussed:  Bleeding, incomplete drainage and infection   Alternatives discussed:  Referral Location:    Type:  Abscess   Size:  1.5 cm   Location:  Head   Head/neck location: Left eyebrow line. Pre-procedure details:    Skin preparation:  Chloraprep Anesthesia (see MAR for exact dosages):    Anesthesia method:  Local infiltration   Local anesthetic:  Lidocaine 2% WITH epi Procedure details:    Incision types:  Single straight   Scalpel blade:  11   Wound management:  Probed and deloculated   Drainage:  Purulent (Appears to be a cyst sac)   Drainage amount:  Scant   Packing materials:  None Post-procedure details:     Patient tolerance of procedure:  Tolerated well, no immediate complications   (including critical care time)  Medications Ordered in  UC Medications - No data to display  Initial Impression / Assessment and Plan / UC Course  I have reviewed the triage vital signs and the nursing notes.  Pertinent labs & imaging results that were available during my care of the patient were reviewed by me and considered in my medical decision making (see chart for details).     #Facial abscess Patient is a 59 year old with facial abscess above the left eye.  It appeared that there was a cyst sac and likely this could have been a sebaceous cyst became infected.  Will place on doxycycline.  Recommended following up in 2 to 3 days for reevaluation.  Return and follow-up precautions were discussed.  Patient verbalized understanding.   - Dr. Vanessa Kick examined and performed incision and drainage of the abscess. Final Clinical Impressions(s) / UC Diagnoses   Final diagnoses:  Facial abscess     Discharge Instructions     Take the doxycycline twice a day for 10 days  Apply warm compresses to the eye and gentle pressure around the incision site for the next 48 hours  Keep the wound covered throughout the day.  You may shower and clean the wound with soap and water  I like for you to have reevaluation in 48 to 72 hours to ensure wound is healing well.  If it looks like it is healing perfectly normal you may continue to monitor at home.  When should monitor for worsening swelling, redness spreading or severe pain, or fever, if any of these occur like for you to return or report to the emergency department      ED Prescriptions    Medication Sig Dispense Auth. Provider   doxycycline (VIBRAMYCIN) 100 MG capsule Take 1 capsule (100 mg total) by mouth 2 (two) times daily. 20 capsule Tobin Witucki, Marguerita Beards, PA-C     PDMP not reviewed this encounter.   Purnell Shoemaker, PA-C 12/12/19 2210

## 2019-12-12 NOTE — Discharge Instructions (Signed)
Take the doxycycline twice a day for 10 days  Apply warm compresses to the eye and gentle pressure around the incision site for the next 48 hours  Keep the wound covered throughout the day.  You may shower and clean the wound with soap and water  I like for you to have reevaluation in 48 to 72 hours to ensure wound is healing well.  If it looks like it is healing perfectly normal you may continue to monitor at home.  When should monitor for worsening swelling, redness spreading or severe pain, or fever, if any of these occur like for you to return or report to the emergency department

## 2019-12-12 NOTE — ED Triage Notes (Signed)
Pt reports woke up with a red "knot" swelling above left eye approx 1 week ago and swelling/redness spread to left side of face. Pt states she then started taking Augmentin from previous Rx for tooth infection and reports that swelling started to improve, but reports itching.   Large area of edema noted to left eyebrown; left eye edematous/red.  No tylenol/ibuprofen taken. Also reports general malaise and "sweats".

## 2019-12-13 ENCOUNTER — Telehealth (HOSPITAL_COMMUNITY): Payer: Self-pay

## 2019-12-13 ENCOUNTER — Telehealth (HOSPITAL_COMMUNITY): Payer: Self-pay | Admitting: Family Medicine

## 2019-12-13 MED ORDER — SULFAMETHOXAZOLE-TRIMETHOPRIM 800-160 MG PO TABS: 1.0000 | ORAL_TABLET | Freq: Two times a day (BID) | ORAL | 0 refills | Status: AC

## 2019-12-13 NOTE — Telephone Encounter (Signed)
Doxycycline is making her vomit Would like another antibiotic We will give her Septra DS 1 p.o. twice daily #14

## 2020-01-18 ENCOUNTER — Encounter (HOSPITAL_COMMUNITY): Payer: Self-pay | Admitting: Emergency Medicine

## 2020-01-18 ENCOUNTER — Other Ambulatory Visit: Payer: Self-pay

## 2020-01-18 ENCOUNTER — Emergency Department (HOSPITAL_COMMUNITY)
Admission: EM | Admit: 2020-01-18 | Discharge: 2020-01-18 | Disposition: A | Discharge status: Home / Self Care | Payer: Medicaid Other | Attending: Emergency Medicine | Admitting: Emergency Medicine

## 2020-01-18 ENCOUNTER — Emergency Department (HOSPITAL_COMMUNITY): Payer: Medicaid Other

## 2020-01-18 DIAGNOSIS — F17210 Nicotine dependence, cigarettes, uncomplicated: Secondary | ICD-10-CM | POA: Insufficient documentation

## 2020-01-18 DIAGNOSIS — M25561 Pain in right knee: Secondary | ICD-10-CM

## 2020-01-18 MED ORDER — KETOROLAC TROMETHAMINE 60 MG/2ML IM SOLN
15.0000 mg | Freq: Once | INTRAMUSCULAR | Status: AC
  Administered 2020-01-18: 15 mg via INTRAMUSCULAR
  Filled 2020-01-18: qty 2

## 2020-01-18 NOTE — ED Triage Notes (Signed)
Pt reports right knee injury 3 months ago. Pt reports the pain and swelling have gradually gotten worse. Knee brace on. Pt reports increased difficulty walking. Denies any new injuries.

## 2020-01-18 NOTE — Progress Notes (Signed)
Orthopedic Tech Progress Note Patient Details:  MAURISHA MONGEAU 15-May-1961 [id number]  Ortho Devices Type of Ortho Device: Crutches, Knee Immobilizer Ortho Device/Splint Location: RLU Ortho Device/Splint Interventions: Ordered, Application   Post Interventions Patient Tolerated: Ambulated well, Well Instructions Provided: Poper ambulation with device, Care of device   Janit Pagan 01/18/2020, 8:51 AM

## 2020-01-18 NOTE — ED Provider Notes (Signed)
[facility] EMERGENCY DEPARTMENT Provider Note   CSN: [id number] Arrival date & time: 01/18/20  4944     History Chief Complaint  Patient presents with  . Knee Pain    Kristina Savage is a 59 y.o. female.  59 yo F with a chief complaint of right knee pain.  Patient has been having pain off and on for at least a month.  Went to urgent care and was evaluated placed in a brace and started on NSAIDs.  She has had some improvement and then started bearing weight on the knee again and started having recurrence of her pain and swelling.  She feels that the swelling will go down with immobilization and then seems to recur when she uses it.  Usually will feel pain on the medial aspect of the knee.  She denies fevers denies redness to the area.  Denies recent trauma.  The history is provided by the patient and the spouse.  Knee Pain Location:  Knee Time since incident:  1 month Injury: no   Knee location:  R knee Pain details:    Quality:  Aching   Radiates to:  Does not radiate   Severity:  Moderate   Onset quality:  Gradual   Duration:  2 days   Timing:  Constant   Progression:  Unchanged Chronicity:  New Dislocation: no   Relieved by:  NSAIDs, rest and elevation Worsened by:  Activity and bearing weight Ineffective treatments:  Savage tried Associated symptoms: no fever        Past Medical History:  Diagnosis Date  . Chicken pox   . Genital warts   . Hay fever   . UTI (lower urinary tract infection)     Patient Active Problem List   Diagnosis Date Noted  . Sinusitis, acute frontal 05/14/2015  . Diverticulitis of colon 08/13/2014  . Ovarian cyst 08/13/2014  . Abdominal pain, left lower quadrant 08/13/2014    Past Surgical History:  Procedure Laterality Date  . CERVIX REMOVAL     Laser surgery, not complete removal  . CESAREAN SECTION       OB History   No obstetric history on file.     Family History  Problem Relation Age of Onset  .  Arthritis Mother   . Hypertension Mother   . Arthritis Father   . Diabetes Son   . Cancer Maternal Aunt   . Diabetes Paternal Grandmother     Social History   Tobacco Use  . Smoking status: Current Every Day Smoker    Packs/day: 0.50    Types: Cigarettes  . Smokeless tobacco: Never Used  Vaping Use  . Vaping Use: Never assessed  Substance Use Topics  . Alcohol use: Yes    Alcohol/week: 0.0 standard drinks    Comment: occasional  . Drug use: No    Home Medications Prior to Admission medications   Medication Sig Start Date End Date Taking? Authorizing Provider  chlorhexidine (PERIDEX) 0.12 % solution Rinse mouth with 46mL for 30 seconds twice daily. 05/01/19   Jaynee Eagles, PA-C  doxycycline (VIBRAMYCIN) 100 MG capsule Take 1 capsule (100 mg total) by mouth 2 (two) times daily. 12/12/19   Darr, Marguerita Beards, PA-C  ibuprofen (ADVIL) 600 MG tablet Take 1 tablet (600 mg total) by mouth every 6 (six) hours as needed. 10/12/19   Lamptey, Myrene Galas, MD  lidocaine (XYLOCAINE) 2 % solution Use as directed 15 mLs in the mouth or throat every 8 (  eight) hours as needed for mouth pain. 05/01/19   Jaynee Eagles, PA-C  naproxen (NAPROSYN) 500 MG tablet Take 1 tablet (500 mg total) by mouth 2 (two) times daily. 05/01/19   Jaynee Eagles, PA-C  PROVENTIL HFA 108 (90 BASE) MCG/ACT inhaler Inhale 2 puffs into the lungs daily as needed for shortness of breath.  05/17/14 05/01/19  [provider]    Allergies    Oxycodone-acetaminophen, Percocet [oxycodone-acetaminophen], and Pollen extract  Review of Systems   Review of Systems  Constitutional: Negative for chills and fever.  HENT: Negative for congestion and rhinorrhea.   Eyes: Negative for redness and visual disturbance.  Respiratory: Negative for shortness of breath and wheezing.   Cardiovascular: Negative for chest pain and palpitations.  Gastrointestinal: Negative for nausea and vomiting.  Genitourinary: Negative for dysuria and urgency.    Musculoskeletal: Positive for arthralgias. Negative for myalgias.  Skin: Negative for pallor and wound.  Neurological: Negative for dizziness and headaches.    Physical Exam Updated Vital Signs BP (!) 142/81 (BP Location: Right Arm)   Pulse 85   Temp 98.5 F (36.9 C) (Oral)   Resp 16   Ht 5\' 4"  (1.626 m)   Wt 113.4 kg   SpO2 99%   BMI 42.91 kg/m   Physical Exam Vitals and nursing note reviewed.  Constitutional:      General: She is not in acute distress.    Appearance: She is well-developed. She is not diaphoretic.  HENT:     Head: Normocephalic and atraumatic.  Eyes:     Pupils: Pupils are equal, round, and reactive to light.  Cardiovascular:     Rate and Rhythm: Normal rate and regular rhythm.     Heart sounds: No murmur heard.  No friction rub. No gallop.   Pulmonary:     Effort: Pulmonary effort is normal.     Breath sounds: No wheezing or rales.  Abdominal:     General: There is no distension.     Palpations: Abdomen is soft.     Tenderness: There is no abdominal tenderness.  Musculoskeletal:        General: Tenderness present.     Cervical back: Normal range of motion and neck supple.     Comments: No erythema or warmth to the knee.  Moderate swelling.  Some mild ligamentous laxity at the [facility] and PCL.  McMurray's with some tenderness about the medial aspect of the knee.  Pulse motor and sensation are intact distally.  Skin:    General: Skin is warm and dry.  Neurological:     Mental Status: She is alert and oriented to person, place, and time.  Psychiatric:        Behavior: Behavior normal.     ED Results / Procedures / Treatments   Labs (all labs ordered are listed, but only abnormal results are displayed) Labs Reviewed - No data to display  EKG Savage  Radiology DG Knee Complete 4 Views Right  Result Date: 01/18/2020 CLINICAL DATA:  Pain and swelling.  Injury 3 months ago. EXAM: RIGHT KNEE - COMPLETE 4+ VIEW COMPARISON:  Right knee radiographs  10/12/2019 FINDINGS: The right knee is located. No acute or healing fracture is present. A moderate-sized joint effusion is noted. IMPRESSION: Moderate-sized joint effusion. Electronically Signed   By: San Morelle M.D.   On: 01/18/2020 07:22    Procedures Procedures (including critical care time)  Medications Ordered in ED Medications  ketorolac (TORADOL) injection 15 mg (has no administration  in time range)    ED Course  I have reviewed the triage vital signs and the nursing notes.  Pertinent labs & imaging results that were available during my care of the patient were reviewed by me and considered in my medical decision making (see chart for details).    MDM Rules/Calculators/A&P                          59 yo F with a chief complaints of right knee pain.  Going on for about a month.  Presentation is most concerning for an interarticular injury.  Seems to have effusion and pain off and on with bearing weight.   We will have her follow-up with orthopedics in the office.  Have her nonweightbearing Tylenol and NSAIDs.  7:55 AM:  I have discussed the diagnosis/risks/treatment options with the patient and believe the pt to be eligible for discharge home to follow-up with Ortho. We also discussed returning to the ED immediately if new or worsening sx occur. We discussed the sx which are most concerning (e.g., redness, drainage fever) that necessitate immediate return. Medications administered to the patient during their visit and any new prescriptions provided to the patient are listed below.  Medications given during this visit Medications  ketorolac (TORADOL) injection 15 mg (has no administration in time range)     The patient appears reasonably screen and/or stabilized for discharge and I doubt any other medical condition or other [facility] requiring further screening, evaluation, or treatment in the ED at this time prior to discharge.   Final Clinical Impression(s) / ED  Diagnoses Final diagnoses:  Acute pain of right knee    Rx / DC Orders ED Discharge Orders    Savage       Deno Etienne, DO 01/18/20 5670

## 2020-01-18 NOTE — ED Notes (Signed)
Patient verbalized understanding of dc instructions, vss, ambulatory with nad.   

## 2020-01-18 NOTE — Discharge Instructions (Signed)
Take 4 over the counter ibuprofen tablets 3 times a day or 2 over-the-counter naproxen tablets twice a day for pain. Also take tylenol 1000mg (2 extra strength) four times a day.   Try to keep your weight off of the leg as best you can.  Please follow-up with an orthopedic doctor in the office.   -To apply for Medicaid, please visit your local DSS office or go online to [email or web address]  -To apply for insurance through the [employer], please visit www.healthcare.gov to find a plan that works for you and your family.

## 2020-04-14 ENCOUNTER — Other Ambulatory Visit: Payer: Self-pay

## 2020-04-14 ENCOUNTER — Ambulatory Visit (HOSPITAL_COMMUNITY)
Admission: EM | Admit: 2020-04-14 | Discharge: 2020-04-14 | Disposition: A | Discharge status: Home / Self Care | Payer: 59 | Attending: Family Medicine | Admitting: Family Medicine

## 2020-04-14 ENCOUNTER — Encounter (HOSPITAL_COMMUNITY): Payer: Self-pay | Admitting: Emergency Medicine

## 2020-04-14 DIAGNOSIS — K5732 Diverticulitis of large intestine without perforation or abscess without bleeding: Secondary | ICD-10-CM | POA: Insufficient documentation

## 2020-04-14 DIAGNOSIS — Z8249 Family history of ischemic heart disease and other diseases of the circulatory system: Secondary | ICD-10-CM | POA: Insufficient documentation

## 2020-04-14 DIAGNOSIS — U071 COVID-19: Secondary | ICD-10-CM | POA: Diagnosis not present

## 2020-04-14 DIAGNOSIS — Z87440 Personal history of urinary (tract) infections: Secondary | ICD-10-CM | POA: Insufficient documentation

## 2020-04-14 DIAGNOSIS — J301 Allergic rhinitis due to pollen: Secondary | ICD-10-CM | POA: Diagnosis not present

## 2020-04-14 DIAGNOSIS — Z8261 Family history of arthritis: Secondary | ICD-10-CM | POA: Diagnosis not present

## 2020-04-14 DIAGNOSIS — Z79899 Other long term (current) drug therapy: Secondary | ICD-10-CM | POA: Diagnosis not present

## 2020-04-14 DIAGNOSIS — Z833 Family history of diabetes mellitus: Secondary | ICD-10-CM | POA: Insufficient documentation

## 2020-04-14 DIAGNOSIS — Z885 Allergy status to narcotic agent status: Secondary | ICD-10-CM | POA: Diagnosis not present

## 2020-04-14 DIAGNOSIS — F17210 Nicotine dependence, cigarettes, uncomplicated: Secondary | ICD-10-CM | POA: Insufficient documentation

## 2020-04-14 DIAGNOSIS — Z791 Long term (current) use of non-steroidal anti-inflammatories (NSAID): Secondary | ICD-10-CM | POA: Diagnosis not present

## 2020-04-14 DIAGNOSIS — J019 Acute sinusitis, unspecified: Secondary | ICD-10-CM | POA: Diagnosis not present

## 2020-04-14 MED ORDER — AMOXICILLIN-POT CLAVULANATE 875-125 MG PO TABS
1.0000 | ORAL_TABLET | Freq: Two times a day (BID) | ORAL | 0 refills | Status: DC
Start: 2020-04-14 — End: 2020-04-24

## 2020-04-14 NOTE — ED Triage Notes (Signed)
Pt presents with nasal congestion, headaches, body aches xs 3 days.   Denies n,v,d, chest pain, sob.

## 2020-04-14 NOTE — Discharge Instructions (Signed)
Take Augmentin twice daily for the next 10 days. Please wait for COVID-19 results before initiating antibiotic. Return to urgent care with new or worsening symptoms.

## 2020-04-14 NOTE — ED Provider Notes (Signed)
Emergency Department Provider Note  ____________________________________________  Time seen: Approximately 9:49 AM  I have reviewed the triage vital signs and the nursing notes.   HISTORY  Chief Complaint Headache, Generalized Body Aches, and Nasal Congestion   Historian Patient    HPI Kristina Savage is a 59 y.o. female presents to the emergency department with nasal congestion, headache and body aches for the past 3 days.  Patient states that she gets a sinus infection every year and her current symptoms feel similar.  Patient does have sick contacts in her home as she states that her daughter has recently been tested for COVID-19.  She denies consistent cough, emesis or diarrhea.  No fever at home.  No other alleviating measures have been attempted.   Past Medical History:  Diagnosis Date  . Chicken pox   . Genital warts   . Hay fever   . UTI (lower urinary tract infection)      Immunizations up to date:  Yes.     Past Medical History:  Diagnosis Date  . Chicken pox   . Genital warts   . Hay fever   . UTI (lower urinary tract infection)     Patient Active Problem List   Diagnosis Date Noted  . Sinusitis, acute frontal 05/14/2015  . Diverticulitis of colon 08/13/2014  . Ovarian cyst 08/13/2014  . Abdominal pain, left lower quadrant 08/13/2014    Past Surgical History:  Procedure Laterality Date  . CERVIX REMOVAL     Laser surgery, not complete removal  . CESAREAN SECTION      Prior to Admission medications   Medication Sig Start Date End Date Taking? Authorizing Provider  amoxicillin-clavulanate (AUGMENTIN) 875-125 MG tablet Take 1 tablet by mouth 2 (two) times daily for 10 days. 04/14/20 04/24/20  Lannie Fields, PA-C  chlorhexidine (PERIDEX) 0.12 % solution Rinse mouth with 21mL for 30 seconds twice daily. 05/01/19   Jaynee Eagles, PA-C  ibuprofen (ADVIL) 600 MG tablet Take 1 tablet (600 mg total) by mouth every 6 (six) hours as needed. 10/12/19   Lamptey,  Myrene Galas, MD  lidocaine (XYLOCAINE) 2 % solution Use as directed 15 mLs in the mouth or throat every 8 (eight) hours as needed for mouth pain. 05/01/19   Jaynee Eagles, PA-C  naproxen (NAPROSYN) 500 MG tablet Take 1 tablet (500 mg total) by mouth 2 (two) times daily. 05/01/19   Jaynee Eagles, PA-C  PROVENTIL HFA 108 (90 BASE) MCG/ACT inhaler Inhale 2 puffs into the lungs daily as needed for shortness of breath.  05/17/14 05/01/19  [provider]    Allergies Oxycodone-acetaminophen, Percocet [oxycodone-acetaminophen], and Pollen extract  Family History  Problem Relation Age of Onset  . Arthritis Mother   . Hypertension Mother   . Arthritis Father   . Diabetes Son   . Cancer Maternal Aunt   . Diabetes Paternal Grandmother     Social History Social History   Tobacco Use  . Smoking status: Current Every Day Smoker    Packs/day: 0.50    Types: Cigarettes  . Smokeless tobacco: Never Used  Vaping Use  . Vaping Use: Never assessed  Substance Use Topics  . Alcohol use: Yes    Alcohol/week: 0.0 standard drinks    Comment: occasional  . Drug use: No     Review of Systems  Constitutional: No fever/chills Eyes:  No discharge ENT: Patient has nasal congestion and rhinorrhea. Respiratory: Patient has sporadic cough. Gastrointestinal:   No nausea, no vomiting.  No diarrhea.  No constipation. Musculoskeletal: Negative for musculoskeletal pain. Skin: Negative for rash, abrasions, lacerations, ecchymosis.   ____________________________________________   PHYSICAL EXAM:  VITAL SIGNS: ED Triage Vitals  Enc Vitals Group     BP 04/14/20 0925 122/84     Pulse Rate 04/14/20 0925 100     Resp 04/14/20 0925 20     Temp 04/14/20 0925 98.9 F (37.2 C)     Temp Source 04/14/20 0925 Oral     SpO2 04/14/20 0925 100 %     Weight --      Height --      Head Circumference --      Peak Flow --      Pain Score 04/14/20 0924 0     Pain Loc --      Pain Edu? --      Excl. in [place]? --       Constitutional: Alert and oriented. Well appearing and in no acute distress. Eyes: Conjunctivae are normal. PERRL. EOMI. Head: Atraumatic.  Patient does have some maxillary sinus tenderness to palpation. ENT:      Nose: No congestion/rhinnorhea.      Mouth/Throat: Mucous membranes are moist.  Neck: No stridor.  Full range of motion.  Cardiovascular: Normal rate, regular rhythm. Normal S1 and S2.  Good peripheral circulation. Respiratory: Normal respiratory effort without tachypnea or retractions. Lungs CTAB. Good air entry to the bases with no decreased or absent breath sounds Gastrointestinal: Bowel sounds x 4 quadrants. Soft and nontender to palpation. No guarding or rigidity. No distention. Musculoskeletal: Full range of motion to all extremities. No obvious deformities noted Neurologic:  Normal for age. No gross focal neurologic deficits are appreciated.  Skin:  Skin is warm, dry and intact. No rash noted. Psychiatric: Mood and affect are normal for age. Speech and behavior are normal.   ____________________________________________   LABS (all labs ordered are listed, but only abnormal results are displayed)  Labs Reviewed  SARS CORONAVIRUS 2 (TAT 6-24 HRS)   ____________________________________________  EKG   ____________________________________________  RADIOLOGY   No results found.  ____________________________________________    PROCEDURES  Procedure(s) performed:     Procedures     Medications - No data to display   ____________________________________________   INITIAL IMPRESSION / ASSESSMENT AND PLAN / ED COURSE  Pertinent labs & imaging results that were available during my care of the patient were reviewed by me and considered in my medical decision making (see chart for details).      Assessment and Plan:  Acute sinusitis 59 year old female presents to the emergency department with headache, facial pain, rhinorrhea and body aches  for the past 3 days, stating that her current symptoms feel similar to sinusitis she has experienced in the past.  Vital signs were reassuring at triage.  On physical exam, patient was alert and interactive.  She did have some maxillary sinus tenderness on exam.  I was concerned as she has sick contacts in her home that are being tested for COVID-19.  Patient was tested for COVID-19 here in the urgent care and I recommended that patient await those results before initiating antibiotic.  Patient was prescribed Augmentin to be taken twice daily for the next 10 days and a printed prescription was provided for the patient.  Patient plans to await COVID-19 results before starting Augmentin.  Rest and hydration were encouraged at home.  Tylenol and ibuprofen alternating were recommended for body aches and headache.  All patient questions were answered.  ____________________________________________  FINAL CLINICAL IMPRESSION(S) / ED DIAGNOSES  Final diagnoses:  Acute sinusitis, recurrence not specified, unspecified location      NEW MEDICATIONS STARTED DURING THIS VISIT:  ED Discharge Orders         Ordered    amoxicillin-clavulanate (AUGMENTIN) 875-125 MG tablet  2 times daily        04/14/20 0945              This chart was dictated using voice recognition software/Dragon. Despite best efforts to proofread, errors can occur which can change the meaning. Any change was purely unintentional.     Lannie Fields, [place] 04/14/20 [phone]

## 2020-04-15 LAB — SARS CORONAVIRUS 2 (TAT 6-24 HRS): SARS Coronavirus 2: POSITIVE — AB

## 2020-04-16 ENCOUNTER — Telehealth: Payer: Self-pay | Admitting: Adult Health

## 2020-04-16 NOTE — Telephone Encounter (Signed)
Called to Discuss with patient about Covid symptoms and the use of the monoclonal antibody infusion for those with mild to moderate Covid symptoms and at a high risk of hospitalization.    Unable to reach , left message to call back the infusion hotline.   Shaana Acocella NP-C

## 2020-04-24 ENCOUNTER — Telehealth (INDEPENDENT_AMBULATORY_CARE_PROVIDER_SITE_OTHER): Payer: 59 | Admitting: Family Medicine

## 2020-04-24 ENCOUNTER — Telehealth: Payer: Self-pay

## 2020-04-24 ENCOUNTER — Encounter: Payer: Self-pay | Admitting: Family Medicine

## 2020-04-24 VITALS — BP 129/78 | HR 86 | Ht 65.0 in | Wt 234.0 lb

## 2020-04-24 DIAGNOSIS — U071 COVID-19: Secondary | ICD-10-CM | POA: Insufficient documentation

## 2020-04-24 DIAGNOSIS — R7303 Prediabetes: Secondary | ICD-10-CM | POA: Diagnosis not present

## 2020-04-24 DIAGNOSIS — S8991XD Unspecified injury of right lower leg, subsequent encounter: Secondary | ICD-10-CM | POA: Diagnosis not present

## 2020-04-24 DIAGNOSIS — S8991XA Unspecified injury of right lower leg, initial encounter: Secondary | ICD-10-CM | POA: Insufficient documentation

## 2020-04-24 NOTE — Assessment & Plan Note (Signed)
Recovering well with improvement in symptoms.

## 2020-04-24 NOTE — Telephone Encounter (Signed)
Per appt notes pt is scheduled 04/24/20 at 9:20 for my chart video visit to establish care with transfer from Dr Deborra Medina with Dr Einar Pheasant.

## 2020-04-24 NOTE — Assessment & Plan Note (Signed)
Was seen previously by ortho and told she needed surgery. Was doing well, but re-injured yesterday. Referral to ortho for follow-up. Virtual visit due to covid so unable to examine.

## 2020-04-24 NOTE — Assessment & Plan Note (Signed)
Advised f/u for blood work and annual visit. She will schedule.

## 2020-04-24 NOTE — Telephone Encounter (Signed)
[name] - Client Nonclinical Telephone Record AccessNurse Client [facility] Night - Client Client Site [place] - Night Contact Type Call Who Is Calling Patient / Member / Family / Caregiver Caller Name Cliff Phone Number [phone] Patient Name Kristina Savage Patient DOB 12/10/1960 Call Type Message Only Information Provided Reason for Call Request for General Office Information Initial Comment caller states she has an appt at 920 and she has Covid and she tested positive 10 days ago, she has had symptoms 2 days ago and she is asking if she should still come to her appt Additional Comment message sent, declined triage, office hours provided Disp. Time Disposition Final User 04/24/2020 7:44:57 AM General Information Provided Yes Birdena Jubilee Call Closed By: Birdena Jubilee Transaction Date/Time: 04/24/2020 7:38:02 AM (ET)

## 2020-04-24 NOTE — Progress Notes (Signed)
I connected with RAGNA KRAMLICH on 04/24/20 at  9:20 AM EDT by video and verified that I am speaking with the correct person using two identifiers.   I discussed the limitations, risks, security and privacy concerns of performing an evaluation and management service by video and the availability of in person appointments. I also discussed with the patient that there may be a patient responsible charge related to this service. The patient expressed understanding and agreed to proceed.  Patient location: Home Provider Location: [place] Participants: Lesleigh Noe and [name]   Subjective:     Kristina Savage is a 59 y.o. female presenting for Establish Care and Knee Injury (x 1 day)     HPI   #Covid-19 - overall feeling better - still occasionally nausea, low appetite - cough  #Tremors of the head - sometimes she feels it and other times she doesn't realize she is doing it - husband notices it more when she is upset - PGM with parkinson's   #Knee pain - has hx of chronic knee pain - has seen orthopedic in the past and told she had some injury that needs to be repaired (sounds like meniscus injury) based on description - was told she needed an MRI but too expensive - was resting and was doing well - but was walking her dog yesterday and felt severe pain - treatment: Ibuprofen 800 mg for swelling -   Review of Systems   Social History   Tobacco Use  Smoking Status Current Every Day Smoker  . Packs/day: 0.50  . Years: 30.00  . Pack years: 15.00  . Types: Cigarettes  Smokeless Tobacco Never Used        Objective:   BP Readings from Last 3 Encounters:  04/24/20 129/78  04/14/20 122/84  01/18/20 135/74   Wt Readings from Last 3 Encounters:  04/24/20 234 lb (106.1 kg)  01/18/20 250 lb (113.4 kg)  10/12/19 280 lb (127 kg)   BP 129/78   Pulse 86   Ht 5\' 5"  (1.651 m)   Wt 234 lb (106.1 kg)   BMI 38.94 kg/m    Physical  Exam Constitutional:      Appearance: Normal appearance. She is not ill-appearing.  HENT:     Head: Normocephalic and atraumatic.     Right Ear: External ear normal.     Left Ear: External ear normal.  Eyes:     Conjunctiva/sclera: Conjunctivae normal.  Pulmonary:     Effort: Pulmonary effort is normal. No respiratory distress.  Neurological:     Mental Status: She is alert. Mental status is at baseline.  Psychiatric:        Mood and Affect: Mood normal.        Behavior: Behavior normal.        Thought Content: Thought content normal.        Judgment: Judgment normal.     DG Knee Complete 4 Views Right CLINICAL DATA:  Pain and swelling.  Injury 3 months ago.  EXAM: RIGHT KNEE - COMPLETE 4+ VIEW  COMPARISON:  Right knee radiographs 10/12/2019  FINDINGS: The right knee is located. No acute or healing fracture is present. A moderate-sized joint effusion is noted.  IMPRESSION: Moderate-sized joint effusion.  Electronically Signed   By: San Morelle M.D.   On: 01/18/2020 07:22          Assessment & Plan:   Problem List Items Addressed This Visit  Other   Prediabetes    Advised f/u for blood work and annual visit. She will schedule.       Injury of right knee - Primary    Was seen previously by ortho and told she needed surgery. Was doing well, but re-injured yesterday. Referral to ortho for follow-up. Virtual visit due to covid so unable to examine.       Relevant Orders   Ambulatory referral to Orthopedic Surgery   COVID-19 virus infection    Recovering well with improvement in symptoms.           Return in about 6 weeks (around 06/05/2020).  Lesleigh Noe, MD

## 2020-04-24 NOTE — Telephone Encounter (Signed)
See note from today

## 2020-04-28 ENCOUNTER — Other Ambulatory Visit: Payer: Self-pay | Admitting: Family Medicine

## 2020-04-28 DIAGNOSIS — Z1231 Encounter for screening mammogram for malignant neoplasm of breast: Secondary | ICD-10-CM

## 2020-05-06 ENCOUNTER — Ambulatory Visit
Admission: RE | Admit: 2020-05-06 | Discharge: 2020-05-06 | Disposition: A | Discharge status: Home / Self Care | Payer: 59 | Source: Ambulatory Visit

## 2020-05-06 ENCOUNTER — Other Ambulatory Visit: Payer: Self-pay

## 2020-05-06 DIAGNOSIS — Z1231 Encounter for screening mammogram for malignant neoplasm of breast: Secondary | ICD-10-CM

## 2020-05-15 ENCOUNTER — Other Ambulatory Visit: Payer: Self-pay

## 2020-05-15 ENCOUNTER — Encounter: Payer: Self-pay | Admitting: Family Medicine

## 2020-05-15 ENCOUNTER — Ambulatory Visit (INDEPENDENT_AMBULATORY_CARE_PROVIDER_SITE_OTHER): Payer: 59 | Admitting: Family Medicine

## 2020-05-15 VITALS — BP 110/78 | HR 89 | Temp 98.0°F | Ht 65.0 in | Wt 242.2 lb

## 2020-05-15 DIAGNOSIS — F321 Major depressive disorder, single episode, moderate: Secondary | ICD-10-CM | POA: Diagnosis not present

## 2020-05-15 DIAGNOSIS — R413 Other amnesia: Secondary | ICD-10-CM | POA: Diagnosis not present

## 2020-05-15 DIAGNOSIS — R251 Tremor, unspecified: Secondary | ICD-10-CM | POA: Diagnosis not present

## 2020-05-15 DIAGNOSIS — Z13220 Encounter for screening for lipoid disorders: Secondary | ICD-10-CM

## 2020-05-15 DIAGNOSIS — R7303 Prediabetes: Secondary | ICD-10-CM | POA: Diagnosis not present

## 2020-05-15 DIAGNOSIS — Z23 Encounter for immunization: Secondary | ICD-10-CM

## 2020-05-15 LAB — COMPREHENSIVE METABOLIC PANEL
ALT: 18 U/L (ref 0–35)
AST: 14 U/L (ref 0–37)
Albumin: 4.1 g/dL (ref 3.5–5.2)
Alkaline Phosphatase: 94 U/L (ref 39–117)
BUN: 19 mg/dL (ref 6–23)
CO2: 31 mEq/L (ref 19–32)
Calcium: 9.2 mg/dL (ref 8.4–10.5)
Chloride: 103 mEq/L (ref 96–112)
Creatinine, Ser: 0.65 mg/dL (ref 0.40–1.20)
GFR: 97.6 mL/min (ref >60.00)
Glucose, Bld: 85 mg/dL (ref 70–99)
Potassium: 4.3 mEq/L (ref 3.5–5.1)
Sodium: 141 mEq/L (ref 135–145)
Total Bilirubin: 0.6 mg/dL (ref 0.2–1.2)
Total Protein: 6.4 g/dL (ref 6.0–8.3)

## 2020-05-15 LAB — LIPID PANEL
Cholesterol: 196 mg/dL (ref 0–200)
HDL: 62.1 mg/dL (ref >39.00)
LDL Cholesterol: 118 mg/dL — ABNORMAL HIGH (ref 0–99)
NonHDL: 133.77
Total CHOL/HDL Ratio: 3
Triglycerides: 79 mg/dL (ref 0.0–149.0)
VLDL: 15.8 mg/dL (ref 0.0–40.0)

## 2020-05-15 LAB — VITAMIN B12: Vitamin B-12: 226 pg/mL (ref 211–911)

## 2020-05-15 LAB — HEMOGLOBIN A1C: Hgb A1c MFr Bld: 6.2 % (ref 4.6–6.5)

## 2020-05-15 LAB — TSH: TSH: 2.11 u[IU]/mL (ref 0.35–4.50)

## 2020-05-15 MED ORDER — METFORMIN HCL 500 MG PO TABS: 500.0000 mg | ORAL_TABLET | Freq: Two times a day (BID) | ORAL | 3 refills | Status: DC

## 2020-05-15 MED ORDER — FLUOXETINE HCL 20 MG PO CAPS: 20.0000 mg | ORAL_CAPSULE | Freq: Every day | ORAL | 3 refills | Status: DC

## 2020-05-15 NOTE — Assessment & Plan Note (Signed)
May be related to depression. Labs. Start depression treatment. Will do memory assessment at f/u visit

## 2020-05-15 NOTE — Assessment & Plan Note (Signed)
Long duration of symptoms. Start Fluoxetine 20 mg daily. Increase in 4 weeks if no improvement. Return in 6 weeks.

## 2020-05-15 NOTE — Patient Instructions (Addendum)
Labs today Return in 6 weeks  Ok to start taking 40 mg of Fluoxetine in 3-4 weeks if you would like    You are going to start a new antidepressant medication.   One of the risks of this medication is increase in suicidal thoughts.   Your suicide Action plan is as follows:  1) Call famil 2) Call the Suicide Hotline [phone] which is available 24 hours 3) Call the Clinic   The most common side effect is stomach upset. If this happens it means the medication is working. It should get better in 1-3 weeks.   Medication for depression and anxiety often takes 6-8 weeks to have a noticeable difference so stick with it. Also the best way for recovery is taking medication and seeing a therapist -- this is so important.    How to help anxiety and depression  1) Regular Exercise - walking, jogging, cycling, dancing, strength training - aiming for 150 minutes of exercise a week --> Yoga has been shown in research to reduce depression and anxiety -- with even just one hour long session per week  2)  Begin a Mindfulness/Meditation practice -- this can take a little as 3 minutes and is helpful for all kinds of mood issues -- You can find resources in books -- Or you can download apps like  ---- Headspace App  ---- Calm  ---- Insignt Timer ---- Stop, Breathe & Think  # With each of these Apps - you should decline the "start free trial" offer and as you search through the App should be able to access some of their free content. You can also chose to pay for the content if you find one that works well for you.   # Many of them also offer sleep specific content which may help with insomnia  3) Healthy Diet -- Avoid or decrease Caffeine -- Avoid or decrease Alcohol -- Drink plenty of water, have a balanced diet -- Avoid cigarettes and marijuana (as well as other recreational drugs)  4) Find a therapist  -- Florin is one option. Call [phone] -- Or you can check  out www.psychologytoday.com -- you can read bios of therapists and see if they accept insurance -- Check with your insurance to see if you have coverage and who may take your insurance

## 2020-05-15 NOTE — Assessment & Plan Note (Signed)
FmHx of parkinson's in [place]. Will get labs today and referral to neurology. Did not have time assess fully given time spend on depression. Will continue to address at f/u visit

## 2020-05-15 NOTE — Assessment & Plan Note (Signed)
Discussed importance of healthy lifestyle and diet. She is interested in metformin if still present. Last checked 5 years ago. Repeat today

## 2020-05-15 NOTE — Progress Notes (Signed)
Subjective:     Kristina Savage is a 59 y.o. female presenting for head shaking (off and on x 1 yr and getting worse)     HPI  #Prediabetes - is interested in metformin  #Depression - has been staying in the house - feels depressed - recent knee injury - lost her mom in 2017 - some good days and some bad days - tries to "pull herself out of it" - will feel good, and then wake up the next day   #Head Shaking - had gotten worse recently - noticed at night initially - whey laying in bed - would turn over and go away - now when   Review of Systems   Social History   Tobacco Use  Smoking Status Current Every Day Smoker  . Packs/day: 0.50  . Years: 30.00  . Pack years: 15.00  . Types: Cigarettes  Smokeless Tobacco Never Used        Objective:    BP Readings from Last 3 Encounters:  05/15/20 110/78  04/24/20 129/78  04/14/20 122/84   Wt Readings from Last 3 Encounters:  05/15/20 242 lb 4 oz (109.9 kg)  04/24/20 234 lb (106.1 kg)  01/18/20 250 lb (113.4 kg)    BP 110/78 (BP Location: Left Arm, Patient Position: Sitting, Cuff Size: Large)   Pulse 89   Temp 98 F (36.7 C) (Temporal)   Ht 5\' 5"  (1.651 m)   Wt 242 lb 4 oz (109.9 kg)   SpO2 95%   BMI 40.31 kg/m    Physical Exam Constitutional:      General: She is not in acute distress.    Appearance: She is well-developed. She is not diaphoretic.  HENT:     Right Ear: External ear normal.     Left Ear: External ear normal.     Nose: Nose normal.  Eyes:     Conjunctiva/sclera: Conjunctivae normal.  Cardiovascular:     Rate and Rhythm: Normal rate.  Pulmonary:     Effort: Pulmonary effort is normal.  Musculoskeletal:     Cervical back: Neck supple.  Skin:    General: Skin is warm and dry.     Capillary Refill: Capillary refill takes less than 2 seconds.  Neurological:     Mental Status: She is alert and oriented to person, place, and time. Mental status is at baseline.     Motor: No  weakness.     Coordination: Coordination normal.     Comments: Shaking tremor of head, slight intention tremor of hand  Psychiatric:        Mood and Affect: Mood normal.        Behavior: Behavior normal.      Depression screen PHQ 2/9 05/15/2020  Decreased Interest 1  Down, Depressed, Hopeless 1  PHQ - 2 Score 2  Altered sleeping 3  Tired, decreased energy 2  Change in appetite 2  Feeling bad or failure about yourself  0  Trouble concentrating 1  Moving slowly or fidgety/restless 0  Suicidal thoughts 0  PHQ-9 Score 10  Difficult doing work/chores Somewhat difficult    The 10-year ASCVD risk score Mikey Bussing DC Jr., et al., 2013) is: 3.7%   Values used to calculate the score:     Age: 49 years     Sex: Female     Is Non-Hispanic African American: No     Diabetic: No     Tobacco smoker: Yes     Systolic Blood Pressure:  110 mmHg     Is BP treated: No     HDL Cholesterol: 62.1 mg/dL     Total Cholesterol: 196 mg/dL     Assessment & Plan:   Problem List Items Addressed This Visit      Other   Prediabetes - Primary    Discussed importance of healthy lifestyle and diet. She is interested in metformin if still present. Last checked 5 years ago. Repeat today      Relevant Orders   Hemoglobin A1c (Completed)   Morbid obesity (HCC)   Relevant Orders   Comprehensive metabolic panel (Completed)   Lipid panel (Completed)   Current moderate episode of major depressive disorder without prior episode (HCC)    Long duration of symptoms. Start Fluoxetine 20 mg daily. Increase in 4 weeks if no improvement. Return in 6 weeks.       Relevant Medications   FLUoxetine (PROZAC) 20 MG capsule   Tremor    FmHx of parkinson's in [place]. Will get labs today and referral to neurology. Did not have time assess fully given time spend on depression. Will continue to address at f/u visit      Relevant Orders   TSH (Completed)   Ambulatory referral to Neurology   Memory change    May be  related to depression. Labs. Start depression treatment. Will do memory assessment at f/u visit      Relevant Orders   Vitamin B12 (Completed)    Other Visit Diagnoses    Screening for hyperlipidemia       Relevant Orders   Lipid panel (Completed)   Need for prophylactic vaccination with combined diphtheria-tetanus-pertussis (DTP) vaccine       Relevant Orders   Tdap vaccine greater than or equal to 7yo IM (Completed)   Need for immunization against influenza       Relevant Orders   Flu Vaccine QUAD 36+ mos IM (Completed)     Work-up (TSH, Vit B12) - normal  Return in about 6 weeks (around 06/26/2020).  Lesleigh Noe, MD  This visit occurred during the SARS-CoV-2 public health emergency.  Safety protocols were in place, including screening questions prior to the visit, additional usage of staff PPE, and extensive cleaning of exam room while observing appropriate contact time as indicated for disinfecting solutions.

## 2020-05-20 ENCOUNTER — Telehealth: Payer: Self-pay | Admitting: Family Medicine

## 2020-05-20 NOTE — Telephone Encounter (Signed)
Pt called checking on surgical clearance paperwork  Dr Veverly Fells @ [facility] office stated they faxed paperwork 10/21  Pt has surgery scheduled 11/17  Please advise

## 2020-05-21 NOTE — Telephone Encounter (Signed)
Called patient to schedule surgical clearance appointment between 11/8-11/11. LVM to call back.

## 2020-05-27 NOTE — H&P (Signed)
Patient's anticipated LOS is less than 2 midnights, meeting these requirements: - Younger than 41 - Lives within 1 hour of care - Has a competent adult at home to recover with post-op recover - NO history of  - Chronic pain requiring opiods  - Diabetes  - Coronary Artery Disease  - Heart failure  - Heart attack  - Stroke  - DVT/VTE  - Cardiac arrhythmia  - Respiratory Failure/COPD  - Renal failure  - Anemia  - Advanced Liver disease       Kristina Savage is an 59 y.o. female.    Chief Complaint: right knee pain  HPI: Pt is a 59 y.o. female complaining of right knee pain for multiple months. Pain had continually increased since the beginning. X-rays in the clinic show meniscal tear right knee. Pt has tried various conservative treatments which have failed to alleviate their symptoms, including injections and therapy. Various options are discussed with the patient. Risks, benefits and expectations were discussed with the patient. Patient understand the risks, benefits and expectations and wishes to proceed with surgery.   PCP:  Lesleigh Noe, MD  D/C Plans: Home  PMH: Past Medical History:  Diagnosis Date  . Chicken pox   . Genital warts   . Hay fever   . UTI (lower urinary tract infection)     PSH: Past Surgical History:  Procedure Laterality Date  . CERVIX REMOVAL     Laser surgery, not complete removal  . CESAREAN SECTION      Social History:  reports that she has been smoking cigarettes. She has a 15.00 pack-year smoking history. She has never used smokeless tobacco. She reports previous alcohol use. She reports that she does not use drugs.  Allergies:  Allergies  Allergen Reactions  . Oxycodone-Acetaminophen Nausea And Vomiting  . Percocet [Oxycodone-Acetaminophen] Nausea And Vomiting  . Pollen Extract     Nasal congestion    Medications: No current facility-administered medications for this encounter.   Current Outpatient Medications  Medication  Sig Dispense Refill  . FLUoxetine (PROZAC) 20 MG capsule Take 1 capsule (20 mg total) by mouth daily. 30 capsule 3  . ibuprofen (ADVIL) 600 MG tablet Take 1 tablet (600 mg total) by mouth every 6 (six) hours as needed. 30 tablet 0  . metFORMIN (GLUCOPHAGE) 500 MG tablet Take 1 tablet (500 mg total) by mouth 2 (two) times daily with a meal. 180 tablet 3    No results found for this or any previous visit (from the past 48 hour(s)). No results found.  ROS: Pain with rom of the right lower extremity  Physical Exam: Alert and oriented 59 y.o. female in no acute distress Cranial nerves 2-12 intact Cervical spine: full rom with no tenderness, nv intact distally Chest: active breath sounds bilaterally, no wheeze rhonchi or rales Heart: regular rate and rhythm, no murmur Abd: non tender non distended with active bowel sounds Hip is stable with rom  Right knee painful rom Mild effusion nv intact distally Antalgic gait  Assessment/Plan Assessment: right knee meniscal tear  Plan:  Patient will undergo a right knee arthroscopy by Dr. Veverly Fells at [facility] Risks benefits and expectations were discussed with the patient. Patient understand risks, benefits and expectations and wishes to proceed. Preoperative templating of the joint replacement has been completed, documented, and submitted to the Operating Room personnel in order to optimize intra-operative equipment management.   Brad Kayln Garceau PA-C, MPAS [employer] is now [employer]  [facility] [address]  [address], [address], [place], [place] [address] Phone: [phone] [email or web address] Facebook  [employer]

## 2020-06-02 ENCOUNTER — Encounter: Payer: Self-pay | Admitting: Family Medicine

## 2020-06-02 ENCOUNTER — Ambulatory Visit (INDEPENDENT_AMBULATORY_CARE_PROVIDER_SITE_OTHER): Payer: 59 | Admitting: Family Medicine

## 2020-06-02 ENCOUNTER — Other Ambulatory Visit: Payer: Self-pay

## 2020-06-02 DIAGNOSIS — F17200 Nicotine dependence, unspecified, uncomplicated: Secondary | ICD-10-CM | POA: Insufficient documentation

## 2020-06-02 DIAGNOSIS — Z720 Tobacco use: Secondary | ICD-10-CM

## 2020-06-02 DIAGNOSIS — F321 Major depressive disorder, single episode, moderate: Secondary | ICD-10-CM

## 2020-06-02 DIAGNOSIS — R7303 Prediabetes: Secondary | ICD-10-CM

## 2020-06-02 MED ORDER — NICOTINE 21 MG/24HR TD PT24: 21.0000 mg | MEDICATED_PATCH | Freq: Every day | TRANSDERMAL | 0 refills | Status: DC

## 2020-06-02 MED ORDER — FLUOXETINE HCL 40 MG PO CAPS: 40.0000 mg | ORAL_CAPSULE | Freq: Every day | ORAL | 3 refills | Status: DC

## 2020-06-02 NOTE — Progress Notes (Signed)
Subjective:    Kristina Savage is a 59 y.o. female who presents to the office today for a preoperative consultation at the request of surgeon Dr. Veverly Fells who plans on performing Arthroscopic knee surgery and mensicus tear repair on November 17.   This consultation is requested for the specific conditions prompting preoperative evaluation (i.e. because of potential affect on operative risk): smoking. Planned anesthesia is general. The patient has the following known anesthesia issues: no prior issues.    Patient has a bleeding risk of: no recent abnormal bleeding. Patient does have objections to receiving blood products if needed.  Respiratory Risk Factors:  Denies: negative Social History   Tobacco Use  . Smoking status: Current Every Day Smoker    Packs/day: 0.50    Years: 30.00    Pack years: 15.00    Types: Cigarettes  . Smokeless tobacco: Never Used  Substance Use Topics  . Alcohol use: Not Currently    Alcohol/week: 0.0 standard drinks    Comment: occasional    If symptoms present: Consider pulmonary function testing or peak flow, CXR, ECG (>40 yo), hemoglobin, glucose (>45 yo)  Cardiac Risk Factors Age >40, other risks If presents - should obtain ECG. Further tests indicated if ECG with abnormalities   METs - Is able to complete exercise of 4 or more METs Yes > 4 METs: climbing 1 flight of stairs, mowing the lawn (walking), gardening, golfing w/o a cart, doubles tennis, swimming, riding a bike, square dancing, jogging   Patient is having a Low Risk risk surgery.  High Risk surgery: emergency surgery, anticipated increased blood loss, aortic or peripheral vascular surgery Intermediate Risk: Abdominal/thoracic, head and neck, carotid endarterectomy, orthopedic surgery, prostate surgery Low Risk: Breast surgery, cataract surgery, superficial surgery, endoscopy   The following portions of the patient's history were reviewed and updated as appropriate: allergies, current  medications, past family history, past medical history, past social history, past surgical history and problem list.  Review of Systems Review of Systems  Constitutional: Negative for chills and fever.  HENT: Negative for congestion and sore throat.   Eyes: Negative for blurred vision and double vision.  Respiratory: Negative for shortness of breath.   Cardiovascular: Negative for chest pain.  Gastrointestinal: Negative for heartburn, nausea and vomiting.  Genitourinary: Negative.   Musculoskeletal: Negative.  Negative for myalgias.  Skin: Negative for rash.  Neurological: Negative for dizziness and headaches.  Endo/Heme/Allergies: Does not bruise/bleed easily.  Psychiatric/Behavioral: Negative for depression. The patient is not nervous/anxious.       Objective:      Physical Exam BP Readings from Last 3 Encounters:  06/02/20 100/80  05/15/20 110/78  04/24/20 129/78   Wt Readings from Last 3 Encounters:  06/02/20 248 lb 12 oz (112.8 kg)  05/15/20 242 lb 4 oz (109.9 kg)  04/24/20 234 lb (106.1 kg)    Physical Exam Constitutional:      General: She is not in acute distress.    Appearance: She is well-developed. She is not diaphoretic.  HENT:     Right Ear: External ear normal.     Left Ear: External ear normal.     Nose: Nose normal.  Eyes:     Conjunctiva/sclera: Conjunctivae normal.  Cardiovascular:     Rate and Rhythm: Normal rate and regular rhythm.     Heart sounds: No murmur heard.   Pulmonary:     Effort: Pulmonary effort is normal. No respiratory distress.     Breath sounds: Normal  breath sounds. No wheezing.  Musculoskeletal:     Cervical back: Neck supple.  Skin:    General: Skin is warm and dry.     Capillary Refill: Capillary refill takes less than 2 seconds.  Neurological:     Mental Status: She is alert. Mental status is at baseline.  Psychiatric:        Mood and Affect: Mood normal.        Behavior: Behavior normal.      Predictors of  intubation difficulty:  Morbid obesity? yes   Anatomically abnormal facies? no  Prominent incisors? no  Receding mandible? no  Short, thick neck? no  Neck range of motion: normal   Cardiographics Normal blood pressure and minor procedure, not indicated   Lab Review  Office Visit on 05/15/2020  Component Date Value  . Sodium 05/15/2020 141   . Potassium 05/15/2020 4.3   . Chloride 05/15/2020 103   . CO2 05/15/2020 31   . Glucose, Bld 05/15/2020 85   . BUN 05/15/2020 19   . Creatinine, Ser 05/15/2020 0.65   . Total Bilirubin 05/15/2020 0.6   . Alkaline Phosphatase 05/15/2020 94   . AST 05/15/2020 14   . ALT 05/15/2020 18   . Total Protein 05/15/2020 6.4   . Albumin 05/15/2020 4.1   . GFR 05/15/2020 97.60   . Calcium 05/15/2020 9.2   . Cholesterol 05/15/2020 196   . Triglycerides 05/15/2020 79.0   . HDL 05/15/2020 62.10   . VLDL 05/15/2020 15.8   . LDL Cholesterol 05/15/2020 118*  . Total CHOL/HDL Ratio 05/15/2020 3   . NonHDL 05/15/2020 133.77   . Hgb A1c MFr Bld 05/15/2020 6.2   . TSH 05/15/2020 2.11   . Vitamin B-12 05/15/2020 226   Admission on 04/14/2020, Discharged on 04/14/2020  Component Date Value  . SARS Coronavirus 2 04/14/2020 POSITIVE*          Assessment:    59 y.o. female with planned surgery as above.   Known risk factors for perioperative complications: None   Difficulty with intubation is not anticipated.  Cardiac Risk Estimation: per the Revised Cardiac Risk Index (Circ. 100:1043, 1999), the patient's risk factors for cardiac complications include none, putting her in: RCI RISK CLASS I (0 risk factors, risk of major cardiac compl. appr. 0.5%)  Major Clinical Predictors: Present ? No, if Yes ->obtain cardiology consult MI <6 weeks ago, unstable angina, decompensated CHF, significant arrhythmia causing hemodynamic instability, severe valvular disease  Intermediate Clinical Predictors: Present ? No, if Yes -> cardiology if METS <4 or high risk  procedure, OK for surgery if METs>4 AND low or intermediate risk Mild angina pectoris, MI >6 weeks ago, compensated CHF, DM Minor clinical Predictors: Present ? No, if Yes -> cardiology if METS <4 AND high risk procedure, OK for surgery if METs>4 OR low or intermediate risk Advanced age, abnormal ECG, cardiac rhythm other than sinus, local functional capacity, hx of stroke, uncontrolled HTN  Current medications which may produce withdrawal symptoms if withheld perioperatively: none.      Plan:      Cleared for surgery  1. Preoperative workup as follows none. 2. Change in medication regimen before surgery: Ok to hold per surgery. 3. Prophylaxis for cardiac events with perioperative beta-blockers: not indicated. 4. Invasive hemodynamic monitoring perioperatively: at the discretion of anesthesiologist. 5. Deep vein thrombosis prophylaxis postoperatively:regimen to be chosen by surgical team. 6. Surveillance for postoperative MI with ECG immediately postoperatively and on postoperative days 1 and  2 AND troponin levels 24 hours postoperatively and on day 4 or hospital discharge (whichever comes first): not indicated. 7. Other measures: Chronic disease management as below.   Problem List Items Addressed This Visit      Other   Prediabetes    Hgb A1c 6.2. Had diarrhea with day 1 of metformin. Instructed to start with 500 mg once daily and increase only if tolerating.       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 Increase Fluoxetine. OK to hold day of surgery if needed.       Relevant Medications   FLUoxetine (PROZAC) 40 MG capsule   Tobacco abuse    Discussed impact of tobacco/nicotine on wound healing. She is interested in cessation and would like to try patches. Advised double checking with surgical team and she will start nicotine  patch asap.       Relevant Medications   nicotine (NICOTINE STEP 1) 21 mg/24hr patch           Lesleigh Noe, MD

## 2020-06-02 NOTE — Assessment & Plan Note (Signed)
Encouraged healthy eating and exercise post surgery

## 2020-06-02 NOTE — Assessment & Plan Note (Signed)
Doing well. Increase Fluoxetine. OK to hold day of surgery if needed.

## 2020-06-02 NOTE — Assessment & Plan Note (Signed)
Hgb A1c 6.2. Had diarrhea with day 1 of metformin. Instructed to start with 500 mg once daily and increase only if tolerating.

## 2020-06-02 NOTE — Patient Instructions (Signed)
Cleared for surgery 

## 2020-06-02 NOTE — Assessment & Plan Note (Signed)
Discussed impact of tobacco/nicotine on wound healing. She is interested in cessation and would like to try patches. Advised double checking with surgical team and she will start nicotine patch asap.

## 2020-06-04 ENCOUNTER — Encounter (HOSPITAL_BASED_OUTPATIENT_CLINIC_OR_DEPARTMENT_OTHER): Payer: Self-pay | Admitting: Orthopedic Surgery

## 2020-06-05 ENCOUNTER — Encounter (HOSPITAL_BASED_OUTPATIENT_CLINIC_OR_DEPARTMENT_OTHER): Payer: Self-pay | Admitting: Orthopedic Surgery

## 2020-06-05 ENCOUNTER — Other Ambulatory Visit: Payer: Self-pay

## 2020-06-06 NOTE — Progress Notes (Signed)
Left a voice message for pt not to show up for her covid test on Sat 06/07/20 due to pt testing + for covid on 04/14/20 (results in Epic). Based on the guidelines the pt is in the 90 day window to not retest. The pt is still expected to quarantine until their procedure. Therefore, the pt can still have the scheduled procedure on 06/11/20 with Dr. Veverly Fells.   These are the guidelines as follows:  Guidance: Patient previously tested + COVID; now past 90 day window seeking elective surgery (asymptomatic)  Retest patient If negative, proceed with surgery If positive, postpone surgery for 10 days from positive test Patient to quarantine for the (10 days) Do not retest again prior to surgery (even if scheduled a couple of weeks out) Use standard precautions for surgery

## 2020-06-07 ENCOUNTER — Inpatient Hospital Stay (HOSPITAL_COMMUNITY)
Admission: RE | Admit: 2020-06-07 | Discharge: 2020-06-07 | Disposition: A | Discharge status: Home / Self Care | Payer: 59 | Source: Ambulatory Visit

## 2020-06-09 NOTE — Progress Notes (Signed)

## 2020-06-11 ENCOUNTER — Ambulatory Visit (HOSPITAL_BASED_OUTPATIENT_CLINIC_OR_DEPARTMENT_OTHER): Payer: 59 | Admitting: Anesthesiology

## 2020-06-11 ENCOUNTER — Encounter (HOSPITAL_BASED_OUTPATIENT_CLINIC_OR_DEPARTMENT_OTHER)
Admission: RE | Disposition: A | Discharge status: Home / Self Care | Payer: Self-pay | Source: Home / Self Care | Attending: Orthopedic Surgery

## 2020-06-11 ENCOUNTER — Ambulatory Visit (HOSPITAL_BASED_OUTPATIENT_CLINIC_OR_DEPARTMENT_OTHER)
Admission: RE | Admit: 2020-06-11 | Discharge: 2020-06-11 | Disposition: A | Discharge status: Home / Self Care | Payer: 59 | Attending: Orthopedic Surgery | Admitting: Orthopedic Surgery

## 2020-06-11 ENCOUNTER — Other Ambulatory Visit: Payer: Self-pay

## 2020-06-11 ENCOUNTER — Encounter (HOSPITAL_BASED_OUTPATIENT_CLINIC_OR_DEPARTMENT_OTHER): Payer: Self-pay | Admitting: Orthopedic Surgery

## 2020-06-11 DIAGNOSIS — S83241A Other tear of medial meniscus, current injury, right knee, initial encounter: Secondary | ICD-10-CM | POA: Insufficient documentation

## 2020-06-11 DIAGNOSIS — Z79899 Other long term (current) drug therapy: Secondary | ICD-10-CM | POA: Diagnosis not present

## 2020-06-11 DIAGNOSIS — F17210 Nicotine dependence, cigarettes, uncomplicated: Secondary | ICD-10-CM | POA: Insufficient documentation

## 2020-06-11 DIAGNOSIS — Z7984 Long term (current) use of oral hypoglycemic drugs: Secondary | ICD-10-CM | POA: Insufficient documentation

## 2020-06-11 DIAGNOSIS — Z885 Allergy status to narcotic agent status: Secondary | ICD-10-CM | POA: Insufficient documentation

## 2020-06-11 DIAGNOSIS — M2241 Chondromalacia patellae, right knee: Secondary | ICD-10-CM | POA: Diagnosis not present

## 2020-06-11 DIAGNOSIS — X58XXXA Exposure to other specified factors, initial encounter: Secondary | ICD-10-CM | POA: Insufficient documentation

## 2020-06-11 HISTORY — DX: Gastro-esophageal reflux disease without esophagitis: K21.9

## 2020-06-11 HISTORY — DX: Diverticulitis of intestine, part unspecified, without perforation or abscess without bleeding: K57.92

## 2020-06-11 HISTORY — DX: Dizziness and giddiness: R42

## 2020-06-11 HISTORY — DX: Anxiety disorder, unspecified: F41.9

## 2020-06-11 HISTORY — DX: Depression, unspecified: F32.A

## 2020-06-11 HISTORY — PX: KNEE ARTHROSCOPY WITH MEDIAL MENISECTOMY: SHX5651

## 2020-06-11 HISTORY — DX: Prediabetes: R73.03

## 2020-06-11 HISTORY — PX: CHONDROPLASTY: SHX5177

## 2020-06-11 HISTORY — DX: Unspecified ovarian cyst, unspecified side: N83.209

## 2020-06-11 SURGERY — ARTHROSCOPY, KNEE, WITH MEDIAL MENISCECTOMY
Anesthesia: General | Site: Knee | Laterality: Right

## 2020-06-11 MED ORDER — FENTANYL CITRATE (PF) 100 MCG/2ML IJ SOLN
INTRAMUSCULAR | Status: AC
  Filled 2020-06-11: qty 2

## 2020-06-11 MED ORDER — LACTATED RINGERS IV SOLN: INTRAVENOUS | Status: DC

## 2020-06-11 MED ORDER — AMISULPRIDE (ANTIEMETIC) 5 MG/2ML IV SOLN: 10.0000 mg | Freq: Once | INTRAVENOUS | Status: DC | PRN

## 2020-06-11 MED ORDER — MIDAZOLAM HCL 5 MG/5ML IJ SOLN
INTRAMUSCULAR | Status: DC | PRN
  Administered 2020-06-11: 2 mg via INTRAVENOUS

## 2020-06-11 MED ORDER — KETOROLAC TROMETHAMINE 30 MG/ML IJ SOLN
INTRAMUSCULAR | Status: DC | PRN
  Administered 2020-06-11: 30 mg via INTRAVENOUS

## 2020-06-11 MED ORDER — BUPIVACAINE HCL 0.25 % IJ SOLN
INTRAMUSCULAR | Status: DC | PRN
  Administered 2020-06-11: 20 mL via INTRA_ARTICULAR

## 2020-06-11 MED ORDER — HYDROCODONE-ACETAMINOPHEN 5-325 MG PO TABS
1.0000 | ORAL_TABLET | ORAL | 0 refills | Status: DC | PRN
Start: 2020-06-11 — End: 2020-08-21

## 2020-06-11 MED ORDER — CEFAZOLIN SODIUM-DEXTROSE 2-4 GM/100ML-% IV SOLN
INTRAVENOUS | Status: AC
  Filled 2020-06-11: qty 100

## 2020-06-11 MED ORDER — PROPOFOL 10 MG/ML IV BOLUS
INTRAVENOUS | Status: AC
  Filled 2020-06-11: qty 20

## 2020-06-11 MED ORDER — KETOROLAC TROMETHAMINE 30 MG/ML IJ SOLN
INTRAMUSCULAR | Status: AC
  Filled 2020-06-11: qty 1

## 2020-06-11 MED ORDER — EPHEDRINE SULFATE-NACL 50-0.9 MG/10ML-% IV SOSY
PREFILLED_SYRINGE | INTRAVENOUS | Status: DC | PRN
  Administered 2020-06-11: 10 mg via INTRAVENOUS

## 2020-06-11 MED ORDER — PROMETHAZINE HCL 25 MG/ML IJ SOLN: 6.2500 mg | INTRAMUSCULAR | Status: DC | PRN

## 2020-06-11 MED ORDER — MIDAZOLAM HCL 2 MG/2ML IJ SOLN
INTRAMUSCULAR | Status: AC
  Filled 2020-06-11: qty 2

## 2020-06-11 MED ORDER — DEXAMETHASONE SODIUM PHOSPHATE 10 MG/ML IJ SOLN
INTRAMUSCULAR | Status: AC
  Filled 2020-06-11: qty 1

## 2020-06-11 MED ORDER — PROPOFOL 10 MG/ML IV BOLUS
INTRAVENOUS | Status: DC | PRN
  Administered 2020-06-11: 200 mg via INTRAVENOUS

## 2020-06-11 MED ORDER — CEFAZOLIN SODIUM-DEXTROSE 2-4 GM/100ML-% IV SOLN
2.0000 g | INTRAVENOUS | Status: AC
  Administered 2020-06-11: 2 g via INTRAVENOUS

## 2020-06-11 MED ORDER — ONDANSETRON HCL 4 MG/2ML IJ SOLN
INTRAMUSCULAR | Status: AC
  Filled 2020-06-11: qty 2

## 2020-06-11 MED ORDER — ONDANSETRON HCL 4 MG PO TABS: 4.0000 mg | ORAL_TABLET | Freq: Three times a day (TID) | ORAL | 1 refills | Status: DC | PRN

## 2020-06-11 MED ORDER — LIDOCAINE 2% (20 MG/ML) 5 ML SYRINGE
INTRAMUSCULAR | Status: DC | PRN
  Administered 2020-06-11: 80 mg via INTRAVENOUS

## 2020-06-11 MED ORDER — FENTANYL CITRATE (PF) 100 MCG/2ML IJ SOLN
INTRAMUSCULAR | Status: DC | PRN
  Administered 2020-06-11 (×2): 50 ug via INTRAVENOUS

## 2020-06-11 MED ORDER — HYDROMORPHONE HCL 1 MG/ML IJ SOLN: 0.2500 mg | INTRAMUSCULAR | Status: DC | PRN

## 2020-06-11 MED ORDER — ASPIRIN 81 MG PO CHEW: 81.0000 mg | CHEWABLE_TABLET | Freq: Two times a day (BID) | ORAL | 0 refills | Status: AC

## 2020-06-11 MED ORDER — METHOCARBAMOL 500 MG PO TABS: 500.0000 mg | ORAL_TABLET | Freq: Three times a day (TID) | ORAL | 1 refills | Status: DC | PRN

## 2020-06-11 MED ORDER — ONDANSETRON HCL 4 MG/2ML IJ SOLN
INTRAMUSCULAR | Status: DC | PRN
  Administered 2020-06-11: 4 mg via INTRAVENOUS

## 2020-06-11 MED ORDER — DEXAMETHASONE SODIUM PHOSPHATE 10 MG/ML IJ SOLN
INTRAMUSCULAR | Status: DC | PRN
  Administered 2020-06-11: 10 mg via INTRAVENOUS

## 2020-06-11 MED ORDER — SODIUM CHLORIDE 0.9 % IR SOLN
Status: DC | PRN
  Administered 2020-06-11: 4500 mL

## 2020-06-11 MED ORDER — MEPERIDINE HCL 25 MG/ML IJ SOLN: 6.2500 mg | INTRAMUSCULAR | Status: DC | PRN

## 2020-06-11 MED ORDER — LIDOCAINE 2% (20 MG/ML) 5 ML SYRINGE
INTRAMUSCULAR | Status: AC
  Filled 2020-06-11: qty 5

## 2020-06-11 SURGICAL SUPPLY — 36 items
APL SKNCLS STERI-STRIP NONHPOA (GAUZE/BANDAGES/DRESSINGS)
BENZOIN TINCTURE PRP APPL 2/3 (GAUZE/BANDAGES/DRESSINGS) IMPLANT
BLADE EXCALIBUR 4.0MM X 13CM (MISCELLANEOUS)
BLADE EXCALIBUR 4.0X13 (MISCELLANEOUS) IMPLANT
BNDG ELASTIC 6X5.8 VLCR STR LF (GAUZE/BANDAGES/DRESSINGS) ×6 IMPLANT
BNDG GAUZE ELAST 4 BULKY (GAUZE/BANDAGES/DRESSINGS) ×6 IMPLANT
CLOSURE WOUND 1/2 X4 (GAUZE/BANDAGES/DRESSINGS) ×1
COVER WAND RF STERILE (DRAPES) IMPLANT
DISSECTOR  3.8MM X 13CM (MISCELLANEOUS) ×3
DISSECTOR 3.8MM X 13CM (MISCELLANEOUS) ×1 IMPLANT
DRAPE ARTHROSCOPY W/POUCH 90 (DRAPES) ×3 IMPLANT
DURAPREP 26ML APPLICATOR (WOUND CARE) ×3 IMPLANT
ELECT MENISCUS 165MM 90D (ELECTRODE) IMPLANT
ELECT REM PT RETURN 9FT ADLT (ELECTROSURGICAL)
ELECTRODE REM PT RTRN 9FT ADLT (ELECTROSURGICAL) IMPLANT
GLOVE BIO SURGEON STRL SZ7 (GLOVE) ×3 IMPLANT
GLOVE ECLIPSE 6.5 STRL STRAW (GLOVE) ×3 IMPLANT
GLOVE SURG SIGNA 7.5 PF LTX (GLOVE) ×3 IMPLANT
GOWN STRL REUS W/ TWL LRG LVL3 (GOWN DISPOSABLE) ×2 IMPLANT
GOWN STRL REUS W/TWL LRG LVL3 (GOWN DISPOSABLE) ×6
HOLDER KNEE FOAM BLUE (MISCELLANEOUS) ×3 IMPLANT
MANIFOLD NEPTUNE II (INSTRUMENTS) IMPLANT
PACK ARTHROSCOPY DSU (CUSTOM PROCEDURE TRAY) ×3 IMPLANT
PACK BASIN DAY SURGERY FS (CUSTOM PROCEDURE TRAY) ×3 IMPLANT
PADDING CAST ABS 4INX4YD NS (CAST SUPPLIES)
PADDING CAST ABS COTTON 4X4 ST (CAST SUPPLIES) IMPLANT
PENCIL SMOKE EVACUATOR (MISCELLANEOUS) IMPLANT
PORT APPOLLO RF 90DEGREE MULTI (SURGICAL WAND) IMPLANT
PROBE BIPOLAR ARTHRO 85MM 30D (MISCELLANEOUS) IMPLANT
STRIP CLOSURE SKIN 1/2X4 (GAUZE/BANDAGES/DRESSINGS) ×2 IMPLANT
SUT MNCRL AB 4-0 PS2 18 (SUTURE) ×3 IMPLANT
TOWEL GREEN STERILE FF (TOWEL DISPOSABLE) ×3 IMPLANT
TUBING ARTHROSCOPY IRRIG 16FT (MISCELLANEOUS) ×3 IMPLANT
UNDERPAD 30X36 HEAVY ABSORB (UNDERPADS AND DIAPERS) IMPLANT
WATER STERILE IRR 1000ML POUR (IV SOLUTION) IMPLANT
WRAP KNEE MAXI GEL POST OP (GAUZE/BANDAGES/DRESSINGS) ×3 IMPLANT

## 2020-06-11 NOTE — Anesthesia Postprocedure Evaluation (Signed)
Anesthesia Post Note  Patient: Kristina Savage  Procedure(s) Performed: KNEE ARTHROSCOPY WITH MEDIAL MENISECTOMY (Right Knee) CHONDROPLASTY (Right Knee)     Patient location during evaluation: PACU Anesthesia Type: General Level of consciousness: awake and alert Pain management: pain level controlled Vital Signs Assessment: post-procedure vital signs reviewed and stable Respiratory status: spontaneous breathing, nonlabored ventilation, respiratory function stable and patient connected to nasal cannula oxygen Cardiovascular status: blood pressure returned to baseline and stable Postop Assessment: no apparent nausea or vomiting Anesthetic complications: no   No complications documented.  Last Vitals:  Vitals:   06/11/20 1500 06/11/20 1514  BP: 97/88 123/80  Pulse: 76 71  Resp: 17 12  Temp:    SpO2: 100% 100%    Last Pain:  Vitals:   06/11/20 1514  TempSrc:   PainSc: 0-No pain        RLE Motor Response: Purposeful movement;Responds to commands (06/11/20 1514) RLE Sensation: Full sensation (06/11/20 1514)      [name] S

## 2020-06-11 NOTE — Anesthesia Preprocedure Evaluation (Signed)
Anesthesia Evaluation  Patient identified by MRN, date of birth, ID band Patient awake    Reviewed: Allergy & Precautions, NPO status , Patient's Chart, lab work & pertinent test results  Airway Mallampati: II  TM Distance: >3 FB Neck ROM: Full    Dental no notable dental hx.    Pulmonary neg pulmonary ROS, Current Smoker and Patient abstained from smoking.,    Pulmonary exam normal breath sounds clear to auscultation       Cardiovascular negative cardio ROS Normal cardiovascular exam Rhythm:Regular Rate:Normal     Neuro/Psych Anxiety Depression negative neurological ROS  negative psych ROS   GI/Hepatic Neg liver ROS, GERD  ,  Endo/Other  Morbid obesity  Renal/GU negative Renal ROS  negative genitourinary   Musculoskeletal negative musculoskeletal ROS (+)   Abdominal (+) + obese,   Peds negative pediatric ROS (+)  Hematology negative hematology ROS (+)   Anesthesia Other Findings   Reproductive/Obstetrics negative OB ROS                             Anesthesia Physical Anesthesia Plan  ASA: III  Anesthesia Plan: General   Post-op Pain Management:    Induction: Intravenous  PONV Risk Score and Plan: 2 and Ondansetron, Midazolam and Treatment may vary due to age or medical condition  Airway Management Planned: LMA  Additional Equipment:   Intra-op Plan:   Post-operative Plan: Extubation in OR  Informed Consent: I have reviewed the patients History and Physical, chart, labs and discussed the procedure including the risks, benefits and alternatives for the proposed anesthesia with the patient or authorized representative who has indicated his/her understanding and acceptance.     Dental advisory given  Plan Discussed with: CRNA  Anesthesia Plan Comments:         Anesthesia Quick Evaluation

## 2020-06-11 NOTE — Interval H&P Note (Signed)
History and Physical Interval Note:  06/11/2020 1:46 PM  Kristina Savage  has presented today for surgery, with the diagnosis of Right knee medial meniscus tear.  The various methods of treatment have been discussed with the patient and family. After consideration of risks, benefits and other options for treatment, the patient has consented to  Procedure(s): ARTHROSCOPY KNEE (Right) as a surgical intervention.  The patient's history has been reviewed, patient examined, no change in status, stable for surgery.  I have reviewed the patient's chart and labs.  Questions were answered to the patient's satisfaction.     Augustin Schooling

## 2020-06-11 NOTE — Brief Op Note (Signed)
06/11/2020  2:51 PM  PATIENT:  Kristina Savage  59 y.o. female  PRE-OPERATIVE DIAGNOSIS:  Right knee medial meniscus tear, chondromalacia  POST-OPERATIVE DIAGNOSIS:  Right knee medial meniscus tear, chondromalacia  PROCEDURE:  Procedure(s): KNEE ARTHROSCOPY WITH MEDIAL MENISECTOMY (Right) CHONDROPLASTY (Right)  SURGEON:  Surgeon(s) and Role:    Netta Cedars, MD - Primary  PHYSICIAN ASSISTANT:   ASSISTANTS: none   ANESTHESIA:   general  EBL:  5 mL   BLOOD ADMINISTERED:none  DRAINS: none   LOCAL MEDICATIONS USED:  MARCAINE     SPECIMEN:  No Specimen  DISPOSITION OF SPECIMEN:  N/A  COUNTS:  YES  TOURNIQUET:  * No tourniquets in log *  DICTATION: .Other Dictation: Dictation Number [id number]  PLAN OF CARE: Discharge to home after PACU  PATIENT DISPOSITION:  PACU - hemodynamically stable.   Delay start of Pharmacological VTE agent (>24hrs) due to surgical blood loss or risk of bleeding: no

## 2020-06-11 NOTE — Op Note (Signed)
NAME: Kristina Savage, Kristina Savage MEDICAL RECORD [id number] ACCOUNT [id number] DATE OF BIRTH:27-Jan-1961 FACILITY: [facility] LOCATION: [facility] PHYSICIAN:STEVEN Orlena Sheldon, MD  OPERATIVE REPORT  DATE OF PROCEDURE:  06/11/2020  PREOPERATIVE DIAGNOSIS:  Right knee medial meniscus tear.  POSTOPERATIVE DIAGNOSES: 1.  Right knee medial meniscus tear. 2.  Right knee chondromalacia, medial compartment and patellofemoral compartment.  PROCEDURE PERFORMED:  Right knee arthroscopy with partial medial meniscectomy and chondroplasty in the patellofemoral compartment and medial compartment using debridement technique.  ATTENDING SURGEON:  Esmond Plants, MD  ASSISTANT:  None.  ANESTHESIA:  LMA general anesthesia was used.  ESTIMATED BLOOD LOSS:  Minimal.  FLUID REPLACEMENT:  1000 mL crystalloid.  INSTRUMENT COUNTS:  Correct.  COMPLICATIONS:  No complications.  ANTIBIOTICS:  Perioperative antibiotics were given.  INDICATIONS:  The patient is a 59 year old female with a history of worsening right knee pain secondary to torn medial meniscus.  The patient has had a failure of conservative management.  Desires operative treatment to restore function and eliminate  pain.  Informed consent obtained.  DESCRIPTION OF PROCEDURE:  After an adequate level of anesthesia was achieved, the patient was positioned in the supine position, right leg correctly identified and sterilely prepped and draped in the usual manner.  Timeout called, verifying correct  patient, correct site.  We entered the right knee using standard portals including superolateral outflow, anterolateral scope and anteromedial working portals.  Identified significant patellofemoral chondromalacia, mostly on the patellar side grade III,  with loose flaps and fibrillation.  The trochlea looked pretty good with minimal chondromalacia, just some fibrillation there.  Medial and lateral gutters were inspected, no loose bodies identified.  We entered the  medial compartment.  There was  significant weightbearing articular cartilage damage, mostly on the femur, but some on the tibia.  Loose flaps and fibrillation identified and removed using suction shaver with tangential technique.  We identified a complex medial meniscus tear, mostly  radial tear pattern in the junction of the posterior horn and the middle part of the meniscus.  We went ahead and debrided that using basket forceps and motorized shaver back to a smooth meniscal rim.  Meniscal root integrity was intact.  About 30% of  the meniscus had to be removed and remaining meniscal tissue was probed and felt to be stable.  After completion of the chondroplasty and the partial meniscectomy of the medial compartment, we entered the notch.    ACL and PCL were intact.  Lateral  compartment entered and no meniscus tear identified as we probed and visualized the meniscus from the anterior horn to the posterior horn.  Articular cartilage in good shape in the lateral compartment.  So we concluded surgery with final lavage of the  knee joint, inspection and debridement of the patellofemoral joint and medial compartment as well as partial meniscectomy and we finished surgery, suturing the wounds with 4-0 Monocryl followed by Steri-Strips and a sterile compressive bandage.  The  patient was transported to the recovery room in stable condition, having tolerated surgery well.  HN/NUANCE  D:06/11/2020 T:06/11/2020 JOB:[id number]

## 2020-06-11 NOTE — Anesthesia Procedure Notes (Signed)
Procedure Name: LMA Insertion Date/Time: 06/11/2020 2:13 PM Performed by: [name] Bal, CRNA Pre-anesthesia Checklist: Patient identified, Emergency Drugs available, Suction available and Patient being monitored Patient Re-evaluated:Patient Re-evaluated prior to induction Oxygen Delivery Method: Circle system utilized Preoxygenation: Pre-oxygenation with 100% oxygen Induction Type: IV induction Ventilation: Mask ventilation without difficulty LMA: LMA inserted LMA Size: 4.0 Number of attempts: 1 Airway Equipment and Method: Bite block Placement Confirmation: positive ETCO2 Tube secured with: Tape Dental Injury: Teeth and Oropharynx as per pre-operative assessment

## 2020-06-11 NOTE — Discharge Instructions (Signed)
Ice to the knee constantly.  Keep the incision covered and clean and dry for five days, then ok to get it wet in the shower.  Do exercise as instructed every hour, please to prevent stiffness.    Ankle pumps Straight leg raises Heel slides (knee bending)  DO NOT prop anything under the knee, it will make your knee stiff.  Prop under the ankle to encourage your knee to go straight.   Use the walker/crutches while you are up and around to take the weight off of the knee this week and for balance. Transition to full weight on the knee next week.  Wear your support stockings 24/7 to prevent blood clots and take baby aspirin twice daily for 30 days also to prevent blood clots  Follow up with Dr Veverly Fells in two weeks in the office, call [phone] for appt   Post Anesthesia Home Care Instructions  Activity: Get plenty of rest for the remainder of the day. A responsible individual must stay with you for 24 hours following the procedure.  For the next 24 hours, DO NOT: -Drive a car -Paediatric nurse -Drink alcoholic beverages -Take any medication unless instructed by your physician -Make any legal decisions or sign important papers.  Meals: Start with liquid foods such as gelatin or soup. Progress to regular foods as tolerated. Avoid greasy, spicy, heavy foods. If nausea and/or vomiting occur, drink only clear liquids until the nausea and/or vomiting subsides. Call your physician if vomiting continues.  Special Instructions/Symptoms: Your throat may feel dry or sore from the anesthesia or the breathing tube placed in your throat during surgery. If this causes discomfort, gargle with warm salt water. The discomfort should disappear within 24 hours.  If you had a scopolamine patch placed behind your ear for the management of post- operative nausea and/or vomiting:  1. The medication in the patch is effective for 72 hours, after which it should be removed.  Wrap patch in a tissue and discard  in the trash. Wash hands thoroughly with soap and water. 2. You may remove the patch earlier than 72 hours if you experience unpleasant side effects which may include dry mouth, dizziness or visual disturbances. 3. Avoid touching the patch. Wash your hands with soap and water after contact with the patch.    May take Ibuprofen after 6:30, if needed.

## 2020-06-11 NOTE — Transfer of Care (Signed)
Immediate Anesthesia Transfer of Care Note  Patient: Kristina Savage  Procedure(s) Performed: KNEE ARTHROSCOPY WITH MEDIAL MENISECTOMY (Right Knee) CHONDROPLASTY (Right Knee)  Patient Location: PACU  Anesthesia Type:General  Level of Consciousness: awake, alert  and oriented  Airway & Oxygen Therapy: Patient Spontanous Breathing and Patient connected to face mask oxygen  Post-op Assessment: Report given to RN and Post -op Vital signs reviewed and stable  Post vital signs: Reviewed and stable  Last Vitals:  Vitals Value Taken Time  BP 137/79 06/11/20 1454  Temp    Pulse 65 06/11/20 1456  Resp 10 06/11/20 1456  SpO2 100 % 06/11/20 1456  Vitals shown include unvalidated device data.  Last Pain:  Vitals:   06/11/20 1221  TempSrc: Oral  PainSc: 0-No pain      Patients Stated Pain Goal: 7 (81/02/54 8628)  Complications: No complications documented.

## 2020-06-12 ENCOUNTER — Encounter (HOSPITAL_BASED_OUTPATIENT_CLINIC_OR_DEPARTMENT_OTHER): Payer: Self-pay | Admitting: Orthopedic Surgery

## 2020-06-30 ENCOUNTER — Ambulatory Visit: Payer: 59 | Admitting: Family Medicine

## 2020-08-04 ENCOUNTER — Other Ambulatory Visit: Payer: Self-pay

## 2020-08-04 ENCOUNTER — Encounter: Payer: Self-pay | Admitting: Neurology

## 2020-08-04 ENCOUNTER — Ambulatory Visit: Payer: 59 | Admitting: Neurology

## 2020-08-04 VITALS — BP 150/103 | HR 76 | Ht 64.5 in | Wt 266.0 lb

## 2020-08-04 DIAGNOSIS — R42 Dizziness and giddiness: Secondary | ICD-10-CM

## 2020-08-04 DIAGNOSIS — R251 Tremor, unspecified: Secondary | ICD-10-CM

## 2020-08-04 DIAGNOSIS — R419 Unspecified symptoms and signs involving cognitive functions and awareness: Secondary | ICD-10-CM

## 2020-08-04 DIAGNOSIS — G4719 Other hypersomnia: Secondary | ICD-10-CM

## 2020-08-04 DIAGNOSIS — R351 Nocturia: Secondary | ICD-10-CM

## 2020-08-04 DIAGNOSIS — G479 Sleep disorder, unspecified: Secondary | ICD-10-CM

## 2020-08-04 DIAGNOSIS — G250 Essential tremor: Secondary | ICD-10-CM | POA: Diagnosis not present

## 2020-08-04 NOTE — Patient Instructions (Signed)
You have a mild head tremor and a left hand tremor.  Given your family history of tremor, you may have a hereditary tremor called essential tremor.  I do not see any signs of parkinsonism thankfully.   For now, I would like to focus on lifestyle modification.  We can certainly consider medication for symptom control down the road.   As discussed, we will proceed with a brain MRI to rule out a structural cause of your tremor.  Please remember, that any kind of tremor may be exacerbated by anxiety, anger, nervousness, excitement, dehydration, sleep deprivation, by caffeine, and low blood sugar values or blood sugar fluctuations and thyroid disease.  She had recent blood work within the last 3 months with your primary care physician. Some medications can exacerbate tremors, this includes Prozac.  I would like for you to increase your water intake as you also complained of dizziness.  Please increase your water intake to 6 to 8 cups/day, 8 ounce size each, or 3-4 bottles per day, 16 ounce size.  Please reduce your caffeine intake to limit yourself to 1 or 2 servings of soda per day, or 1 bottle.  You indicated that you do not sleep very well at night.  Sleep deprivation can also be a trigger for dizziness and tremors.  I would like to proceed with a sleep study to look for signs of sleep apnea.  If you have obstructive sleep apnea, I will likely recommend a machine called CPAP or AutoPap.  Untreated sleep apnea can cause long-term complications such as heart disease, stroke, dementia, irregular heartbeat, congestive heart failure.

## 2020-08-04 NOTE — Progress Notes (Signed)
Subjective:    Patient ID: Kristina Savage is a 60 y.o. female.  HPI     Star Age, MD, PhD [facility] [address], [address] [place], [place] [address]  Dear Dr. Einar Pheasant,   I saw your patient, Kristina Savage, upon your kind request, in my Neurologic clinic today for initial consultation of her tremors.  The patient is accompanied by her husband today.  As you know, [name] is a 60 year old left-handed woman with an underlying medical history of reflux disease, prediabetes, diverticulitis, dizziness, anxiety, depression, smoking, and severe obesity with a BMI of over 40, who reports an approximately 1 year history of head tremor, typically side to side, not typically noticed by her.  She has had a left hand tremor as well which is intermittent, sometimes it is worse than others.  Her husband has noted that it is worse when she is stressed out.  She reports a family history of tremor and Parkinson's disease.  Paternal grandmother had Parkinson's disease and father had hand tremors.  She also has several other complaints including feeling dizzy and lightheaded, spinning sensation, not feeling well balance, not always picking up her feet, having fallen, having sleep disruption, not feeling fully rested, husband has noted that she is sleepy during the day.  She does snore.  She has never had a sleep study.  Sometimes she feels lightheaded when she bends over.  Of note, she does not drink a lot of water.  She admits to drinking diet Pepsi about 2-3 bottles per day.  She is in the process of smoking cessation, in fact, she had quit smoking in November 2021 but had a recent cigarette.  She is currently not working.  She was started on Prozac about 2 months ago.  She has not necessarily noticed any exacerbation of her tremor since then.  She reports right shoulder pain.    eI reviewed your office note from 05/15/2020. She had blood work through your office at the time  including B12, TSH, A1c, lipid panel, CMP.  I reviewed the results.  Labs were unremarkable, A1c was 6.2, in the prediabetes range.   Her Past Medical History Is Significant For: Past Medical History:  Diagnosis Date  . Anxiety   . Chicken pox   . Depression   . Diverticulitis   . Dizziness   . Genital warts   . GERD (gastroesophageal reflux disease)   . Hay fever   . Ovarian cyst   . Pre-diabetes   . UTI (lower urinary tract infection)     Her Past Surgical History Is Significant For: Past Surgical History:  Procedure Laterality Date  . CERVIX REMOVAL     Laser surgery, not complete removal  . CESAREAN SECTION    . CHONDROPLASTY Right 06/11/2020   Procedure: CHONDROPLASTY;  Surgeon: Netta Cedars, MD;  Location: [facility];  Service: Orthopedics;  Laterality: Right;  . KNEE ARTHROSCOPY WITH MEDIAL MENISECTOMY Right 06/11/2020   Procedure: KNEE ARTHROSCOPY WITH MEDIAL MENISECTOMY;  Surgeon: Netta Cedars, MD;  Location: [facility];  Service: Orthopedics;  Laterality: Right;    Her Family History Is Significant For: Family History  Problem Relation Age of Onset  . Arthritis Mother   . Hypertension Mother   . COPD Mother   . Heart failure Mother   . Arthritis Father   . Thyroid disease Father        thinks cancer  . Diabetes Son   .  Cancer Maternal Aunt        Female cancer - not sure what  . Breast cancer Maternal Aunt   . Diabetes Paternal Grandmother   . Parkinson's disease Paternal Grandmother   . Prostate cancer Maternal Uncle   . Breast cancer Maternal Grandmother     Her Social History Is Significant For: Social History   Socioeconomic History  . Marital status: Married    Spouse name: Kristina Savage  . Number of children: 7  . Years of education: High school  . Highest education level: Not on file  Occupational History  . Occupation: CSR  Tobacco Use  . Smoking status: Current Every Day Smoker    Packs/day: 0.50    Years: 30.00     Pack years: 15.00    Types: Cigarettes  . Smokeless tobacco: Never Used  Vaping Use  . Vaping Use: Not on file  Substance and Sexual Activity  . Alcohol use: Not Currently    Alcohol/week: 0.0 standard drinks    Comment: once a year  . Drug use: No  . Sexual activity: Yes    Birth control/protection: Post-menopausal  Other Topics Concern  . 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 Oxycodone-Acetaminophen Nausea And Vomiting  . Percocet [Oxycodone-Acetaminophen] Nausea And Vomiting  . Pollen Extract     Nasal congestion  :   Her Current Medications Are:  Outpatient Encounter Medications as of 08/04/2020  Medication Sig  . FLUoxetine (PROZAC) 40 MG capsule Take 1 capsule (40 mg total) by mouth daily.  Marland Kitchen HYDROcodone-acetaminophen (NORCO) 5-325 MG tablet Take 1-2 tablets by mouth every 4 (four) hours as needed for moderate pain or severe pain.  Marland Kitchen ibuprofen (ADVIL) 600 MG tablet Take 1 tablet (600 mg total) by mouth every 6 (six) hours as needed.  . methocarbamol (ROBAXIN) 500 MG tablet Take 1 tablet (500 mg total) by mouth every 8 (eight) hours as needed for muscle spasms.  . nicotine (NICOTINE STEP 1) 21 mg/24hr patch Place 1 patch (21  mg total) onto the skin daily.  . ondansetron (ZOFRAN) 4 MG tablet Take 1 tablet (4 mg total) by mouth every 8 (eight) hours as needed for nausea, vomiting or refractory nausea / vomiting.  . [DISCONTINUED] PROVENTIL HFA 108 (90 BASE) MCG/ACT inhaler Inhale 2 puffs into the lungs daily as needed for shortness of breath.    No facility-administered encounter medications on file as of 08/04/2020.  :   Review of Systems:  Out of a complete 14 point review of systems, all are reviewed  and negative with the exception of these symptoms as listed below:  Review of Systems  Neurological:       Pt presents today to discuss her tremors. Pt has noticed the tremors in her left hand. Pt is left handed. Pt also notices some shaking in her head. Her symptoms have worsened over the past year. Pt's symptoms worsen with stress.    Objective:  Neurological Exam  Physical Exam Physical Examination:   Vitals:   08/04/20 0943  BP: (!) 150/103  Pulse: 76    General Examination: The patient is a very pleasant 60 y.o. female in no acute distress. She appears well-developed and well-nourished and well groomed.   HEENT: Normocephalic, atraumatic, pupils are equal, round and reactive to light and accommodation.  Corrective eyeglasses in place.  Tracking is well-preserved, hearing grossly intact.  Face is symmetric with normal facial animation and normal facial sensation to light touch, temperature and vibration.  No hypophonia, no dysarthria, no obvious voice tremor.  She does have a subtle side-to-side head tremor, neck is supple with full range of motion.  No carotid bruits.  Airway examination reveals moderate mouth dryness, tongue protrudes centrally and palate elevates symmetrically.  Moderate airway crowding noted.    Chest: Clear to auscultation without wheezing, rhonchi or crackles noted.  Heart: S1+S2+0, regular and normal without murmurs, rubs or gallops noted.   Abdomen: Soft, non-tender and  non-distended with normal bowel sounds appreciated on auscultation.  Extremities: There is no pitting edema in the distal lower extremities bilaterally. Pedal pulses are intact.  Skin: Warm and dry without trophic changes noted.  Musculoskeletal: exam reveals decreased range of motion in the right shoulder, she reports pain.     Neurologically:  Mental status: The patient is awake, alert and oriented in all 4 spheres. Her immediate and remote memory, attention, language skills and fund of knowledge are appropriate. There is no evidence of aphasia, agnosia, apraxia or anomia. Speech is clear with normal prosody and enunciation. Thought process is linear. Mood is normal and affect is normal.  Cranial nerves II - XII are as described above under HEENT exam. In addition: shoulder shrug is normal with equal shoulder height noted.  On 08/04/2020: On Archimedes spiral drawing, there is no significant tremor with either hand.  Handwriting is legible, not particularly tremulous, not micrographic.  No obvious resting tremor, she has a mild postural tremor in both upper extremities, left more noticeable than right, mild action tremor, no intention tremor.  At times, the left hand tremor has a distractible quality and variable presentation.  No lower extremity tremor noted.  Motor exam: Normal bulk, strength and tone is noted. There is no drift, resting tremor or rebound. Romberg is negative. Reflexes are 2+ throughout. Babinski: Toes are flexor bilaterally. Fine motor skills and coordination: intact with normal finger taps, normal hand movements, normal rapid alternating patting, normal foot taps and normal foot agility.  Cerebellar testing: No dysmetria or intention tremor on finger to nose testing.  Mildly deliberate movements noted with both upper extremities. Heel to shin is unremarkable bilaterally. There is no truncal or gait ataxia.  Sensory exam: intact to light touch, vibration, temperature sense in  the upper and lower extremities.  Gait, station and balance: She stands without difficulty, posture is normal.  She walks with preserved arm swing bilaterally.  No shuffling noted.  Tandem walking is difficult for her, she has significant side to side upper body swaying, movements are slow and deliberate, she did  not do more than 3 steps in tandem.  Assessment and Plan:  In summary, ROSHAWNA COLCLASURE is a very pleasant 60 y.o.-year old female with an underlying medical history of reflux disease, prediabetes, diverticulitis, dizziness, anxiety, depression, smoking, and severe obesity with a BMI of over 40, who presents for evaluation of her tremor disorder.  She has a mild head tremor and hand tremor, mostly left upper extremity.  No signs of parkinsonism.  History and examination could be in keeping with mild essential tremor, she does report a family history of tremors affecting her father but she also reports a family history of Parkinson's disease in her maternal grandmother.  At times, her tremor had a distractible as well as delivered character to it.  Nevertheless, we talked about essential tremor at length today.  We talked about tremor triggers and alleviating factors.  Triggers in her case may very well be suboptimal hydration, sleep disturbance, and daily significant caffeine intake.  She is encouraged to curb her soda intake and increase her water intake, we will proceed with diagnostic testing in the form of brain MRI with and without contrast as well as a sleep study as she may be at risk for obstructive sleep apnea.  She is advised to consider CPAP or AutoPap therapy should her sleep study revealed obstructive sleep apnea.  We talked about potentially utilizing symptomatic treatment for tremor down the road.  Of note, she has several other complaints including dizziness, feeling off balance, cognitive complaints, lightheadedness, having fallen.  She is advised to follow-up with you, she had an elevated  blood pressure reading today.  She had recent blood work through your office which I reviewed.  We will call her to schedule her brain MRI and sleep study once we have the insurance authorization and we will plan to follow-up afterwards.  She is advised that certain medications including antidepressant medications can exacerbate tremors.  She reports not noticing any exacerbation after she started the Prozac.  Nevertheless, it is worth mentioning and monitoring.  I answered all their questions today and the patient and her husband were in agreement with the above outlined plan.  Thank you very much for allowing me to participate in the care of this nice patient. If I can be of any further assistance to you please do not hesitate to call me at [phone].  Sincerely,   Star Age, MD, PhD

## 2020-08-04 NOTE — Progress Notes (Signed)
Epworth Sleepiness Scale 0= would never doze 1= slight chance of dozing 2= moderate chance of dozing 3= high chance of dozing  Sitting and reading: 3 Watching TV: 1 Sitting inactive in a public place (ex. Theater or meeting): 0 As a passenger in a car for an hour without a break: 0 Lying down to rest in the afternoon: 3 Sitting and talking to someone: 0 Sitting quietly after lunch (no alcohol): 1 In a car, while stopped in traffic: 0 Total: 8

## 2020-08-06 ENCOUNTER — Telehealth: Payer: Self-pay | Admitting: Neurology

## 2020-08-06 NOTE — Telephone Encounter (Signed)
[facility] Kristina Savage: [id number] (exp. 08/06/20 to 09/04/20) order sent to GI they are who is in the [facility] w/pt plan. They will reach out to the patient to schedule.

## 2020-08-17 ENCOUNTER — Other Ambulatory Visit: Payer: Self-pay

## 2020-08-17 ENCOUNTER — Ambulatory Visit
Admission: RE | Admit: 2020-08-17 | Discharge: 2020-08-17 | Disposition: A | Discharge status: Home / Self Care | Payer: 59 | Source: Ambulatory Visit | Attending: Neurology | Admitting: Neurology

## 2020-08-17 DIAGNOSIS — R251 Tremor, unspecified: Secondary | ICD-10-CM

## 2020-08-17 DIAGNOSIS — R419 Unspecified symptoms and signs involving cognitive functions and awareness: Secondary | ICD-10-CM

## 2020-08-17 DIAGNOSIS — G250 Essential tremor: Secondary | ICD-10-CM

## 2020-08-17 DIAGNOSIS — G479 Sleep disorder, unspecified: Secondary | ICD-10-CM

## 2020-08-17 DIAGNOSIS — R42 Dizziness and giddiness: Secondary | ICD-10-CM

## 2020-08-17 DIAGNOSIS — R351 Nocturia: Secondary | ICD-10-CM

## 2020-08-17 DIAGNOSIS — G4719 Other hypersomnia: Secondary | ICD-10-CM

## 2020-08-17 MED ORDER — GADOBENATE DIMEGLUMINE 529 MG/ML IV SOLN
20.0000 mL | Freq: Once | INTRAVENOUS | Status: AC | PRN
  Administered 2020-08-17: 20 mL via INTRAVENOUS

## 2020-08-20 ENCOUNTER — Telehealth: Payer: Self-pay

## 2020-08-20 NOTE — Progress Notes (Signed)
Please call patient and advise her that her brain MRI showed no acute findings, no abnormality on contrast administration.  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  She also has evidence of hardening of the arteries type changes which we call white matter changes.  I would like for her to follow-up with her primary care physician to discuss risk factor modification.  For secondary stroke prevention I recommend that she completely stop smoking, work on weight loss, work on management of her blood sugar and blood cholesterol.  Her LDL which is known as the so-called bad cholesterol should be less than 70.  In addition, if she can tolerate it I recommend she start a baby aspirin which is 81 mg daily.  She can find the kind that is coated for better stomach protection.  As discussed, we will proceed with a sleep study.  Please inquire if she has been scheduled yet.

## 2020-08-20 NOTE — Telephone Encounter (Signed)
Claiborne Billings called from [facility] who reports she has contacted pt concerning her recent ER visit this weekend. She reports the pt wants to talk to PCP concerning the MRI she had in the ER but Claiborne Billings reports she is still having stroke symptoms. Claiborne Billings wants to know what this office recommends. Advised pt should go back to the ER and if any problems to contact this office. Kelly verbalized understanding.

## 2020-08-20 NOTE — Telephone Encounter (Signed)
I spoke with pt's husband;  Pt did not go to ED; pts husband said reason called was to schedule urgent appt with Dr Einar Pheasant after FU MRI from neurologist.  pts husband said tremors get worse when pt gets upset; pt already has appt scheduled with Dr Glori Bickers on 08/22/20 at 9:30 to FU after MRI. Since pt has gotten appt at [facility] per her husband she has calmed down and tremors are not as bad. UC & ED precautions given and pt's husband voiced understanding. Sending note to Dr Einar Pheasant as PCP but who is out of office this afternoon, [facility] CMA, and Dr Glori Bickers as provider that pt has appt with.

## 2020-08-20 NOTE — Telephone Encounter (Signed)
I called pt and we reviewed MRI report. Pt verbalized understanding of results and will set up f/u with Dr. Einar Pheasant. She sts she will try taking 81 mg Aspirin per day as well. Pt confirmed she has been scheduled for sleep study. Pt is schedule for hst on 08/27/2020. I have sent MRI report to Dr. Einar Pheasant as well.

## 2020-08-20 NOTE — Telephone Encounter (Signed)
-----   Message from Star Age, MD sent at 08/20/2020  7:51 AM EST ----- Please call patient and advise her that her brain MRI showed no acute findings, no abnormality on contrast administration.  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  She also has evidence of hardening of the arteries type changes which we call white matter changes.  I would like for her to follow-up with her primary care physician to discuss risk factor modification.  For secondary stroke prevention I recommend that she completely stop smoking, work on weight loss, work on management of her blood sugar and blood cholesterol.  Her LDL which is known as the so-called bad cholesterol should be less than 70.  In addition, if she can tolerate it I recommend she start a baby aspirin which is 81 mg daily.  She can find the kind that is coated for better stomach protection.  As discussed, we will proceed with a sleep study.  Please inquire if she has been scheduled yet.

## 2020-08-20 NOTE — Telephone Encounter (Signed)
[place] [facility] - Client TELEPHONE ADVICE RECORD AccessNurse Patient Name: Kristina Savage Gender: Female DOB: 22-May-1961 Age: 60 Y 10 M 15 D Return Phone Number: [phone] (Primary), [phone] (Secondary) [place] [address] Client [place] [facility] - Client Client Site [name] - Day Physician [name]- MD Contact Type Call Who Is Calling Patient / Member / Family / Caregiver Call Type Triage / Clinical Caller Name Sandy Haye Relationship To Patient Self Return Phone Number [phone] (Primary) Chief Complaint FAINTING or [place] Reason for Call Symptomatic / Request for [place] states she is experiencing vertigo to the point that she passed out. She also has tremors that causes her head to shake and has now mnoved into left arm. Translation No Nurse Assessment Nurse: Clovis Riley, RN, Georgina Peer Date/Time (Eastern Time): 08/20/2020 1:09:35 PM Confirm and document reason for call. If symptomatic, describe symptoms. ---Caller states she is experiencing vertigo to the point that she passed out on saturday. She also has tremors that causes her head to shake for about a year and has now moved into left arm and hand is new. She is being seen by neurology and had an MRI done on sunday. Does the patient have any new or worsening symptoms? ---Yes Will a triage be completed? ---Yes Related visit to physician within the last 2 weeks? ---Yes Does the PT have any chronic conditions? (i.e. diabetes, asthma, this includes High risk factors for pregnancy, etc.) ---No Is this a behavioral health or substance abuse call? ---No Guidelines Guideline Title Affirmed Question Affirmed Notes Nurse Date/Time (Eastern Time) Dizziness - Vertigo [1] Loss of speech or garbled speech AND [2] sudden onset AND [3] brief (now gone) Manufacturing engineer, Copy 08/20/2020 1:13:59  PM Disp. Time Eilene Ghazi Time) Disposition Final User 08/20/2020 1:08:37 PM Send to Urgent Queue [place] Level 08/20/2020 1:18:45 PM Go to ED Now (or PCP triage) Yes Clovis Riley, RN, Roslynn Amble NOTE: All timestamps contained within this report are represented as [place] Standard Time. CONFIDENTIALTY NOTICE: This fax transmission is intended only for the addressee. It contains information that is legally privileged, confidential or otherwise protected from use or disclosure. If you are not the intended recipient, you are strictly prohibited from reviewing, disclosing, copying using or disseminating any of this information or taking any action in reliance on or regarding this information. If you have received this fax in error, please notify us immediately by telephone so that we can arrange for its return to [place]. Phone: [phone], Toll-Free: [phone], Fax: [phone] Page: 2 of 2 Call Id: [id number] [place] Disagree/Comply Comply Caller Understands Yes PreDisposition Call Doctor Care Advice Given Per Guideline GO TO ED NOW (OR PCP TRIAGE): * IF NO PCP (PRIMARY CARE PROVIDER) SECOND-LEVEL TRIAGE: You need to be seen within the next hour. Go to the [facility] at _____________ [place] as soon as you can. ANOTHER ADULT SHOULD DRIVE: * It is better and safer if another adult drives instead of you. BRING MEDICINES: * Bring a list of your current medicines when you go to the Emergency Department (ER). * Bring the pill bottles too. This will help the doctor (or NP/PA) to make certain you are taking the right medicines and the right dose. CARE ADVICE given per Dizziness - Vertigo (Adult) guideline. Referrals GO TO FACILITY UNDECIDED

## 2020-08-20 NOTE — Telephone Encounter (Signed)
See note below the access nurse note where Larene Beach RN spoke with Claiborne Billings at [facility] 08/20/20 at 1:21 PM.

## 2020-08-20 NOTE — Telephone Encounter (Signed)
Spoke with patient and her husband who stated that they saw Dr. Einar Pheasant a couple of months ago for hand tremors and they were referred to neurology. Since then, the tremors have gotten much worse and it seems that this correlates with her stress level. Patients husband stated that she fell face first into a chair last week after passing out, but did not seek medical attention. Sunday, she had an MRI done (results in chart) and after the MRI, the patient went to [facility] and was standing in the freezer section and passed out again. Husband stated that these episodes only last for "half a second" and the patient doesn't remember what happens when she becomes conscious again. Patient stated that before the syncopal episodes, she becomes light headed and dizzy. Patient again did not seek medical help and refuses to go to the ED. Patient stated that she would like to see Dr. Einar Pheasant only. This RN educated patient and her husband about being seen as soon as possible, especially if she has new or worsening symptoms. Patient and husband verbalized understanding, but stated that they did not want to go to the ED and be exposed to [place]. Patient agreeable to see another provider at the office and has an appointment with Dr. Glori Bickers on 1/28 at 0930. Instructed patient that if this syncopal episode happens again, she needs to go to the ED, especially if she hits her head. Patient verbalized understanding. Dr. Einar Pheasant made aware.

## 2020-08-20 NOTE — Telephone Encounter (Signed)
Noted. Appreciate Dr. Glori Bickers seeing patient.

## 2020-08-21 ENCOUNTER — Ambulatory Visit (INDEPENDENT_AMBULATORY_CARE_PROVIDER_SITE_OTHER): Payer: 59 | Admitting: Family Medicine

## 2020-08-21 ENCOUNTER — Other Ambulatory Visit: Payer: Self-pay

## 2020-08-21 ENCOUNTER — Encounter: Payer: Self-pay | Admitting: Family Medicine

## 2020-08-21 DIAGNOSIS — I679 Cerebrovascular disease, unspecified: Secondary | ICD-10-CM | POA: Diagnosis not present

## 2020-08-21 DIAGNOSIS — F321 Major depressive disorder, single episode, moderate: Secondary | ICD-10-CM

## 2020-08-21 DIAGNOSIS — E782 Mixed hyperlipidemia: Secondary | ICD-10-CM | POA: Diagnosis not present

## 2020-08-21 DIAGNOSIS — H8110 Benign paroxysmal vertigo, unspecified ear: Secondary | ICD-10-CM

## 2020-08-21 DIAGNOSIS — R2689 Other abnormalities of gait and mobility: Secondary | ICD-10-CM | POA: Insufficient documentation

## 2020-08-21 DIAGNOSIS — R42 Dizziness and giddiness: Secondary | ICD-10-CM | POA: Insufficient documentation

## 2020-08-21 DIAGNOSIS — E785 Hyperlipidemia, unspecified: Secondary | ICD-10-CM | POA: Insufficient documentation

## 2020-08-21 DIAGNOSIS — R251 Tremor, unspecified: Secondary | ICD-10-CM

## 2020-08-21 MED ORDER — ATORVASTATIN CALCIUM 10 MG PO TABS: 10.0000 mg | ORAL_TABLET | Freq: Every day | ORAL | 3 refills | Status: DC

## 2020-08-21 MED ORDER — FLUOXETINE HCL 20 MG PO CAPS: 60.0000 mg | ORAL_CAPSULE | Freq: Every day | ORAL | 3 refills | Status: DC

## 2020-08-21 NOTE — Assessment & Plan Note (Signed)
Mild symptoms of dizziness consistent with BPPV. As this does not seem to be what lead to syncope will watch and wait, but will add to PT referral if worsens

## 2020-08-21 NOTE — Assessment & Plan Note (Signed)
Syncope in setting of postural dizziness suspect orthostatic and dehydration. Advised increased water intake, decrease caffeine. If no improvement will consider cardiology w/o given syncope to rule out other cause.

## 2020-08-21 NOTE — Assessment & Plan Note (Signed)
Noted on MRI. Will start atorvastatin 10 mg and asa 81mg . Advised waiting 1 week until her current symptoms improve.

## 2020-08-21 NOTE — Assessment & Plan Note (Signed)
Improved, but not at goal. Discussed option of increasing prozac 40 mg >60 mg. Also discussed if the tremor worsens it could be related to the medication so we may need to stop.

## 2020-08-21 NOTE — Assessment & Plan Note (Signed)
Note balance concerns. Strength seems intact and symmetric though endorsing weakness. Feel PT evaluation and treatment would be beneficial. Referral placed.

## 2020-08-21 NOTE — Progress Notes (Signed)
Subjective:     Kristina Savage is a 60 y.o. female presenting for Loss of Consciousness and Tremors (Only in L hand )     HPI  # Tremor - saw Dr. Rexene Alberts - was advised to stop smoking - has done this - working on decreasing   #Lightheadedness - has 2 kinds of dizziness  Type 1:  - if she lays her head back on her bed -- the room is spinning - if she turns to the left or right fast this will be triggered - not as often and corrects as soon as it starts  Type 2 - when she transitions from sitting to standing  - she will feel dizzy - never "sure" on her feet - syncopal episode - felt dizzy, went to grab the wall  - fainted for only 1-2 seconds  - denies heart palpations during episodes - water: 3 bottles  - Caffeine: 2 bottles of diet pepsi - will feel "shaky"  Endorses some leg weakness and feeling off balance     Review of Systems  08/04/2020: Neurology - Tremor - decrase soda and increase water. Brain MRI (microvascular changes), OSA evaluation.  - also dizziness, feeling off balance, cognitive, falls - prozac can worsen tremors 08/21/2020: Phone call - MRI with signs of old strokes, tremors worse. Vertigo concern and pt is falling with passing out  Social History   Tobacco Use  Smoking Status Former Smoker  . Packs/day: 0.50  . Years: 30.00  . Pack years: 15.00  . Types: Cigarettes  . Quit date: 06/03/2020  . Years since quitting: 0.2  Smokeless Tobacco Never Used        Objective:    BP Readings from Last 3 Encounters:  08/21/20 110/90  08/04/20 (!) 150/103  06/11/20 125/89   Wt Readings from Last 3 Encounters:  08/21/20 269 lb 8 oz (122.2 kg)  08/04/20 266 lb (120.7 kg)  06/11/20 247 lb 12.8 oz (112.4 kg)    BP 110/90   Pulse 72   Temp (!) 96.7 F (35.9 C) (Temporal)   Wt 269 lb 8 oz (122.2 kg)   SpO2 98%   BMI 45.55 kg/m    Physical Exam Constitutional:      General: She is not in acute distress.    Appearance: She is  well-developed. She is not diaphoretic.  HENT:     Right Ear: External ear normal. Tympanic membrane is scarred.     Left Ear: Tympanic membrane and external ear normal.     Nose: Nose normal.  Eyes:     Conjunctiva/sclera: Conjunctivae normal.  Cardiovascular:     Rate and Rhythm: Normal rate and regular rhythm.     Heart sounds: No murmur heard.   Pulmonary:     Effort: Pulmonary effort is normal. No respiratory distress.     Breath sounds: Normal breath sounds. No wheezing.  Musculoskeletal:     Cervical back: Neck supple.     Comments: Bilateral UE and LE symmetric and intact  Skin:    General: Skin is warm and dry.     Capillary Refill: Capillary refill takes less than 2 seconds.  Neurological:     Mental Status: She is alert. Mental status is at baseline.     Comments: Tremor slight to head and extending to the left UE  Psychiatric:        Mood and Affect: Mood normal.        Behavior: Behavior normal.  Assessment & Plan:   Problem List Items Addressed This Visit      Cardiovascular and Mediastinum   Cerebral vascular disease    Noted on MRI. Will start atorvastatin 10 mg and asa 81mg . Advised waiting 1 week until her current symptoms improve.       Relevant Medications   atorvastatin (LIPITOR) 10 MG tablet     Nervous and Auditory   Benign paroxysmal positional vertigo    Mild symptoms of dizziness consistent with BPPV. As this does not seem to be what lead to syncope will watch and wait, but will add to PT referral if worsens      Relevant Orders   Ambulatory referral to Physical Therapy     Other   Morbid obesity ([place]) - Primary    Pt expressed interest in bariatric surgery. Referral placed for additional discussion.       Relevant Orders   Amb Referral to Bariatric Surgery   Current moderate episode of major depressive disorder without prior episode (HCC)    Improved, but not at goal. Discussed option of increasing prozac 40 mg >60 mg.  Also discussed if the tremor worsens it could be related to the medication so we may need to stop.       Relevant Medications   FLUoxetine (PROZAC) 20 MG capsule   Tremor    Neurology suspects essential tremor. She has quit tobacco. She is working to reduce caffeine but it is worsening. Discussed often treatment is a bp medication and with orthostatic symptoms would want to see improvement before starting. Advised neurology f/u if worsening and not improving      Hyperlipidemia   Relevant Medications   atorvastatin (LIPITOR) 10 MG tablet   Balance problem    Note balance concerns. Strength seems intact and symmetric though endorsing weakness. Feel PT evaluation and treatment would be beneficial. Referral placed.       Relevant Orders   Ambulatory referral to Physical Therapy   Postural dizziness    Syncope in setting of postural dizziness suspect orthostatic and dehydration. Advised increased water intake, decrease caffeine. If no improvement will consider cardiology w/o given syncope to rule out other cause.       Relevant Orders   Ambulatory referral to Physical Therapy       Return in about 6 weeks (around 10/02/2020).  Lesleigh Noe, MD  This visit occurred during the SARS-CoV-2 public health emergency.  Safety protocols were in place, including screening questions prior to the visit, additional usage of staff PPE, and extensive cleaning of exam room while observing appropriate contact time as indicated for disinfecting solutions.

## 2020-08-21 NOTE — Assessment & Plan Note (Signed)
Pt expressed interest in bariatric surgery. Referral placed for additional discussion.

## 2020-08-21 NOTE — Patient Instructions (Addendum)
Dizzy Type 1: Vertigo - Lets watch and wait  Dizzy Type 2: Orthostatic  - Drink more water - try to get exercise - Try to drink at least 5 bottles of water per day - If you drink a bottle of soda - drink 2x that in water - slow transitions -- sit on side of bed, and leg criss cross   Cerebral vascular changes - start cholesterol medication atorvastatin 10 mg and aspirin 81  Call the neurologist for the tremor in your hand  Increase Prozac      Call or update via Mychart next week

## 2020-08-21 NOTE — Assessment & Plan Note (Signed)
Neurology suspects essential tremor. She has quit tobacco. She is working to reduce caffeine but it is worsening. Discussed often treatment is a bp medication and with orthostatic symptoms would want to see improvement before starting. Advised neurology f/u if worsening and not improving

## 2020-08-22 ENCOUNTER — Ambulatory Visit: Payer: 59 | Admitting: Family Medicine

## 2020-08-26 ENCOUNTER — Telehealth: Payer: Self-pay | Admitting: Family Medicine

## 2020-08-26 NOTE — Telephone Encounter (Signed)
Lilia Pro called and stated she received a referral but no demographics about the patient.. please fax : [phone]

## 2020-08-26 NOTE — Telephone Encounter (Signed)
Spoke with Lilia Pro and advised her that I received a fax from [facility] stating patient's insurance is not in network with them, told Lilia Pro I was not sure if someone else handled this referral the other day and somehow did not close it but I did not send anything today. Referral for PT has been sent to [facility] and this is in network per [employer].

## 2020-08-27 ENCOUNTER — Ambulatory Visit (INDEPENDENT_AMBULATORY_CARE_PROVIDER_SITE_OTHER): Payer: 59 | Admitting: Neurology

## 2020-08-27 DIAGNOSIS — G4719 Other hypersomnia: Secondary | ICD-10-CM

## 2020-08-27 DIAGNOSIS — G479 Sleep disorder, unspecified: Secondary | ICD-10-CM

## 2020-08-27 DIAGNOSIS — G250 Essential tremor: Secondary | ICD-10-CM

## 2020-08-27 DIAGNOSIS — G4733 Obstructive sleep apnea (adult) (pediatric): Secondary | ICD-10-CM

## 2020-08-27 DIAGNOSIS — R351 Nocturia: Secondary | ICD-10-CM

## 2020-08-27 DIAGNOSIS — R42 Dizziness and giddiness: Secondary | ICD-10-CM

## 2020-08-27 DIAGNOSIS — R419 Unspecified symptoms and signs involving cognitive functions and awareness: Secondary | ICD-10-CM

## 2020-08-27 DIAGNOSIS — R251 Tremor, unspecified: Secondary | ICD-10-CM

## 2020-08-28 ENCOUNTER — Ambulatory Visit: Payer: 59 | Admitting: Family Medicine

## 2020-08-29 NOTE — Procedures (Signed)
    [facility] TEST (Watch PAT)  STUDY DATE: 08/27/20  DOB: [birth year]  MRN: [id number]  ORDERING CLINICIAN: Star Age, MD, PhD   REFERRING CLINICIAN: Lesleigh Noe, MD   CLINICAL INFORMATION/HISTORY: 60 year old woman with a history of reflux disease, prediabetes, diverticulitis, dizziness, anxiety, depression, smoking, and severe obesity with a BMI of over 40, who reports a head tremor. She complains of sleep disruption, not feeling fully rested, and reports snoring.   Epworth sleepiness score: N/A.  BMI: 45.5 kg/m  Neck Circumference:  N/A  FINDINGS:   Total Record Time (hours, min): 9 h 21 min  Total Sleep Time (hours, min):  7 h 33 min   Percent REM (%):    10.14 %   Calculated pAHI (per hour): 36.1       REM pAHI: 31.2    NREM pAHI: 36.5 Supine AHI: N/A   Oxygen Saturation (%) Mean: 93  Minimum oxygen saturation (%):         81   O2 Saturation Range (%): 81-93  O2Saturation (minutes) <=88%: 0.2 min   Pulse Mean (bpm):    63  Pulse Range (45-97)   IMPRESSION: OSA (obstructive sleep apnea)  RECOMMENDATION:  This home sleep test demonstrates severe obstructive sleep apnea with a total AHI of 36.1/hour and O2 nadir of 81%. Treatment with positive airway pressure is recommended. The patient will be advised to proceed with an autoPAP titration/trial at home for now. A full night titration study may be considered to optimize treatment settings, if needed down the road. Please note that untreated obstructive sleep apnea may carry additional perioperative morbidity. Patients with significant obstructive sleep apnea should receive perioperative PAP therapy and the surgeons and particularly the anesthesiologist should be informed of the diagnosis and the severity of the sleep disordered breathing. The patient should be cautioned not to drive, work at heights, or operate dangerous or heavy equipment when tired or sleepy. Review and reiteration of good  sleep hygiene measures should be pursued with any patient. Other causes of the patient's symptoms, including circadian rhythm disturbances, an underlying mood disorder, medication effect and/or an underlying medical problem cannot be ruled out based on this test. Clinical correlation is recommended. The patient and her referring provider will be notified of the test results. The patient will be seen in follow up in sleep clinic at [facility].  I certify that I have reviewed the raw data recording prior to the issuance of this report in accordance with the standards of the American Academy of Sleep Medicine (AASM).  INTERPRETING PHYSICIAN:  Star Age, MD, PhD  Board Certified in Neurology and Sleep Medicine [facility] [address] [place], [place] [address] [phone]

## 2020-08-29 NOTE — Addendum Note (Signed)
Addended by: Star Age on: 08/29/2020 11:07 AM   Modules accepted: Orders

## 2020-08-29 NOTE — Progress Notes (Signed)
Patient referred by Dr. Einar Pheasant for eval of her head tremor, but she also had sleep related complaints. She was seen by me on 08/04/20, patient had a HST on 08/27/20.    Please call and notify the patient that the recent home sleep test showed obstructive sleep apnea in the severe range. I recommend treatment for this in the form of autoPAP, which means, that we don't have to bring her in for a sleep study with CPAP, but will let her start using a so called autoPAP machine at home, through a DME company (of her choice, or as per insurance requirement). The DME representative will fit the patient with a mask of choice, educate her on how to use the machine, how to put the mask on, etc. I have placed an order in the chart. Please send the order to a local DME, talk to patient, send report to referring MD. Please also reinforce the need for compliance with treatment. We will need a FU in sleep clinic for 10 weeks post-PAP set up, please arrange that with me or one of our NPs. Thanks,   Star Age, MD, PhD [facility])

## 2020-09-01 ENCOUNTER — Telehealth: Payer: Self-pay

## 2020-09-01 NOTE — Progress Notes (Signed)
I called pt and advised of results. See telephone note from 09/01/20.

## 2020-09-01 NOTE — Telephone Encounter (Signed)
I called pt. I advised pt that Dr. Rexene Alberts reviewed their sleep study results and found that pt has severe osa. Dr. Rexene Alberts recommends that pt start an autopap at home for treatment. I reviewed PAP compliance expectations with the pt. Pt is agreeable to starting an auto-PAP. I advised pt that an order will be sent to a DME, [employer], and [employer] will call the pt within about one week after they file with the pt's insurance. [employer] will show the pt how to use the machine, fit for masks, and troubleshoot the auto-PAP if needed. Pt will be called back in March to see if she has received a start date for pap due to the shortage of machines. A letter with all of this information in it will be mailed to the pt as a reminder. I verified with the pt that the address we have on file is correct. Pt verbalized understanding of results. Pt had no questions at this time but was encouraged to call back if questions arise. I have sent the order to [employer] and have received confirmation that they have received the order.

## 2020-09-01 NOTE — Telephone Encounter (Signed)
-----   Message from Star Age, MD sent at 08/29/2020 11:06 AM EST ----- Patient referred by Dr. Einar Pheasant for eval of her head tremor, but she also had sleep related complaints. She was seen by me on 08/04/20, patient had a HST on 08/27/20.    Please call and notify the patient that the recent home sleep test showed obstructive sleep apnea in the severe range. I recommend treatment for this in the form of autoPAP, which means, that we don't have to bring her in for a sleep study with CPAP, but will let her start using a so called autoPAP machine at home, through a DME company (of her choice, or as per insurance requirement). The DME representative will fit the patient with a mask of choice, educate her on how to use the machine, how to put the mask on, etc. I have placed an order in the chart. Please send the order to a local DME, talk to patient, send report to referring MD. Please also reinforce the need for compliance with treatment. We will need a FU in sleep clinic for 10 weeks post-PAP set up, please arrange that with me or one of our NPs. Thanks,   Star Age, MD, PhD [facility])

## 2020-09-09 ENCOUNTER — Telehealth: Payer: Self-pay | Admitting: Family Medicine

## 2020-09-09 DIAGNOSIS — R42 Dizziness and giddiness: Secondary | ICD-10-CM

## 2020-09-09 NOTE — Telephone Encounter (Signed)
AS symptoms of postural dizziness are persisting will refer to cardiology.   Advise the following:   Drink 60-80 oz of water daily Slow transitions - from laying to sitting, sitting to standing

## 2020-09-09 NOTE — Telephone Encounter (Signed)
Routing to triage to call "more serious."  She may just need a follow-up visit.

## 2020-09-09 NOTE — Telephone Encounter (Signed)
Called patient and updated her of Dr. Verda Cumins recommendations. Patient verbalized understanding. Also, patient stated that she was referred for Bariatric Surgery by Dr. Einar Pheasant, but she hasn't heard anything from this.

## 2020-09-09 NOTE — Telephone Encounter (Signed)
I spoke with pt; pt was sent to physical therapy and pt said PT advised they think her symptoms are more than just vertigo. Pt said she feels dizzy like pt is spinning instead of room spinning before feeling like going to pass out. Pt said last dizziness where pt actually fell forward but did not lose consciousness was about one wk ago. Pt said last dizziness is every day especially when trying to get up slowly from sitting position. Pt does not have way to ck BP now. Pt has not lost consciousness at any time and no CP,SOB or H/A. Pt request cb after Dr Einar Pheasant reviews the note. Per last OV 08/21/20 pt said if she needs to see a neurologist pt wants referral to see new neurologist; pt said the previous neurologist did not want to see her again and pt was released from that neurologist. Also in 08/21/20 note was noted might do cardiology referral. UC and ED precautions given and pt voiced understanding.Sending note to Dr Einar Pheasant.

## 2020-09-09 NOTE — Addendum Note (Signed)
Addended by: [name] R on: 09/09/2020 01:40 PM   Modules accepted: Orders

## 2020-09-09 NOTE — Telephone Encounter (Signed)
Pt called in she wanted to update Dr.Cody the trimers in her left hand has slowed down and she almost passed out but she was able to catch the wall before she fell,  The PT stated that it is more than vertigo its something more serious

## 2020-09-10 NOTE — Telephone Encounter (Signed)
Spoke with patient and let her know that referral was sent to [facility] and provided phone number to their office for patient to follow up. Advised patient she will need to attend a free weight loss seminar first and most likely will be a wait to get in for an appointment. I advised patient to call me back if there are any issues.

## 2020-09-11 NOTE — Telephone Encounter (Signed)
Pt called back stating she called [facility] and they told her they do not do Bariatric surgery. I called the office to verify and the rep that answered the phone confirmed that they do not do bariatric surgery. Attempted to contact patient to give her updated information for where the referral will be sent to. Lvm asking her to call office. It has been sent to [facility] and their phone number is [phone]

## 2020-09-18 ENCOUNTER — Ambulatory Visit (INDEPENDENT_AMBULATORY_CARE_PROVIDER_SITE_OTHER): Payer: 59 | Admitting: Cardiology

## 2020-09-18 ENCOUNTER — Other Ambulatory Visit: Payer: Self-pay

## 2020-09-18 ENCOUNTER — Encounter: Payer: Self-pay | Admitting: Cardiology

## 2020-09-18 VITALS — BP 134/90 | HR 76 | Ht 65.0 in | Wt 274.1 lb

## 2020-09-18 DIAGNOSIS — R42 Dizziness and giddiness: Secondary | ICD-10-CM

## 2020-09-18 DIAGNOSIS — E7800 Pure hypercholesterolemia, unspecified: Secondary | ICD-10-CM

## 2020-09-18 NOTE — Progress Notes (Signed)
Cardiology Office Note:    Date:  09/18/2020   ID:  Kristina Savage, DOB Mar 04, 1961, MRN [id number]  PCP:  Lesleigh Noe, MD   [place]  Cardiologist:  No primary care provider on file.  Advanced Practice Provider:  No care team member to display Electrophysiologist:  None       Referring MD: Lesleigh Noe, MD   Chief Complaint  Patient presents with  . Other    Syncope, Postural Dizziness and right shoulder pain. Meds reviewed verbally with pt.   Kristina Savage is a 60 y.o. female who is being seen today for the evaluation of dizziness at the request of Lesleigh Noe, MD.   History of Present Illness:    Kristina Savage is a 60 y.o. female with a hx of hyperlipidemia, anxiety, former smoker x30 years, obesity, resting tremors who presents due to dizziness.  Patient states having dizziness for some time now which is worsened over the past several months.  Initially she used to be able to perform some exercises to help with her dizziness but that has not worked of late.  Dizziness typically occurs when she stands up from sitting position or bends over to pick an object from the floor to tie her shoelace.  She denies chest pain, palpitations, shortness of breath.  About a month ago while walking, she felt dizzy and braced herself from falling.  She is seeing neurology for her baseline tremors.  Also has an upcoming appointment with ENT.  Past Medical History:  Diagnosis Date  . Anxiety   . Chicken pox   . Depression   . Diverticulitis   . Dizziness   . Genital warts   . GERD (gastroesophageal reflux disease)   . Hay fever   . Hyperlipidemia   . Ovarian cyst   . Pre-diabetes   . Stroke ([place])   . UTI (lower urinary tract infection)     Past Surgical History:  Procedure Laterality Date  . CERVIX REMOVAL     Laser surgery, not complete removal  . CESAREAN SECTION    . CHONDROPLASTY Right 06/11/2020   Procedure: CHONDROPLASTY;  Surgeon: Netta Cedars, MD;  Location: [facility];  Service: Orthopedics;  Laterality: Right;  . KNEE ARTHROSCOPY WITH MEDIAL MENISECTOMY Right 06/11/2020   Procedure: KNEE ARTHROSCOPY WITH MEDIAL MENISECTOMY;  Surgeon: Netta Cedars, MD;  Location: [facility];  Service: Orthopedics;  Laterality: Right;    Current Medications: Current Meds  Medication Sig  . aspirin EC 81 MG tablet Take 81 mg by mouth daily. Swallow whole.  Marland Kitchen atorvastatin (LIPITOR) 10 MG tablet Take 1 tablet (10 mg total) by mouth daily.  Marland Kitchen FLUoxetine (PROZAC) 20 MG capsule Take 3 capsules (60 mg total) by mouth daily.  Marland Kitchen ibuprofen (ADVIL) 600 MG tablet Take 1 tablet (600 mg total) by mouth every 6 (six) hours as needed.     Allergies:   Oxycodone-acetaminophen, Percocet [oxycodone-acetaminophen], and Pollen extract   Social History   Socioeconomic History  . Marital status: Married    Spouse name: [name]  . Number of children: 7  . Years of education: High school  . Highest education level: Not on file  Occupational History  . Occupation: CSR  Tobacco Use  . Smoking status: Former Smoker    Packs/day: 0.50    Years: 30.00    Pack years: 15.00    Types: Cigarettes    Quit date: 06/03/2020  Years since quitting: 0.2  . Smokeless tobacco: Never Used  Vaping Use  . Vaping Use: Not on file  Substance and Sexual Activity  . Alcohol use: Not Currently    Alcohol/week: 0.0 standard drinks    Comment: once a year  . Drug use: No  . Sexual activity: Yes    Birth control/protection: Post-menopausal  Other Topics Concern  . 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      Family: 7 children - including step children - twin boys nearby and daughter nearby - 10 grandchildren      Enjoys: spend time with family      Exercise: knee limiting exercise -  tries to walk   Diet: eats whatever      Safety   Seat belts: Yes    Guns: Yes  and secure   Safe in relationships: Yes    Social Determinants of Radio broadcast assistant Strain: Not on file  Food Insecurity: Not on file  Transportation Needs: Not on file  Physical Activity: Not on file  Stress: Not on file  Social Connections: Not on file     Family History: The patient's family history includes Arthritis in her father and mother; Breast cancer in her maternal aunt and maternal grandmother; COPD in her mother; Cancer in her maternal aunt; Diabetes in her paternal grandmother and son; Heart failure in her mother; Hypertension in her mother; Parkinson's disease in her paternal grandmother; Prostate cancer in her maternal uncle; Thyroid disease in her father.  ROS:   Please see the history of present illness.     All other systems reviewed and are negative.  EKGs/Labs/Other Studies Reviewed:    The following studies were reviewed today:   EKG:  EKG is  ordered today.  The ekg ordered today demonstrates normal sinus rhythm  Recent Labs: 05/15/2020: ALT 18; BUN 19; Creatinine, Ser 0.65; Potassium 4.3; Sodium 141; TSH 2.11  Recent Lipid Panel    Component Value Date/Time   CHOL 196 05/15/2020 1008   TRIG 79.0 05/15/2020 1008   HDL 62.10 05/15/2020 1008   CHOLHDL 3 05/15/2020 1008   VLDL 15.8 05/15/2020 1008   LDLCALC 118 (H) 05/15/2020 1008     Risk Assessment/Calculations:      Physical Exam:    VS:  BP 134/90 (BP Location: Right Arm, Patient Position: Sitting, Cuff Size: Large)   Pulse 76   Ht 5\' 5"  (1.651 m)   Wt 274 lb 2 oz (124.3 kg)   SpO2 98%   BMI 45.62 kg/m     Wt Readings from Last 3 Encounters:  09/18/20 274 lb 2 oz (124.3 kg)  08/21/20 269 lb 8 oz (122.2 kg)  08/04/20 266 lb (120.7 kg)     GEN:  Well nourished, well developed in no acute distress HEENT: Normal NECK: No JVD; No carotid bruits LYMPHATICS: No lymphadenopathy CARDIAC: RRR,  no murmurs, rubs, gallops RESPIRATORY:  Clear to auscultation without rales, wheezing or rhonchi  ABDOMEN: Soft, non-tender, non-distended MUSCULOSKELETAL:  No edema; No deformity  SKIN: Warm and dry NEUROLOGIC:  Alert and oriented x 3 PSYCHIATRIC:  Normal affect   ASSESSMENT:    1. Postural dizziness   2. Morbid obesity ([place])   3. Pure hypercholesterolemia    PLAN:    In  order of problems listed above:  1. Patient with dizziness consistent with benign positional vertigo.  Orthostatic vitals in the office today did not show evidence for orthostasis.  She did feel dizzy moving from sitting to standing position.  She has no cardiac symptoms to suggest cardiac etiology.  Recommend trial of Antivert for possible positional vertigo per primary care physician.  I agree with primary care referral to ENT for additional input.  No additional cardiac testing indicated at this time. 2. Patient is morbidly obese, low-calorie diet, weight loss advised. 3. Hyperlipidemia, continue statin.  Follow-up as needed     Medication Adjustments/Labs and Tests Ordered: Current medicines are reviewed at length with the patient today.  Concerns regarding medicines are outlined above.  Orders Placed This Encounter  Procedures  . EKG 12-Lead   No orders of the defined types were placed in this encounter.   Patient Instructions  Medication Instructions:  Your physician recommends that you continue on your current medications as directed. Please refer to the Current Medication list given to you today.  *If you need a refill on your cardiac medications before your next appointment, please call your pharmacy*   Lab Work: None ordered If you have labs (blood work) drawn today and your tests are completely normal, you will receive your results only by: Marland Kitchen MyChart Message (if you have MyChart) OR . A paper copy in the mail If you have any lab test that is abnormal or we need to change your treatment, we  will call you to review the results.   Testing/Procedures: None ordered   Follow-Up: At [facility], you and your health needs are our priority.  As part of our continuing mission to provide you with exceptional heart care, we have created designated Provider Care Teams.  These Care Teams include your primary Cardiologist (physician) and Advanced Practice Providers (APPs -  Physician Assistants and Nurse Practitioners) who all work together to provide you with the care you need, when you need it.  We recommend signing up for the patient portal called "MyChart".  Sign up information is provided on this After Visit Summary.  MyChart is used to connect with patients for Virtual Visits (Telemedicine).  Patients are able to view lab/test results, encounter notes, upcoming appointments, etc.  Non-urgent messages can be sent to your provider as well.   To learn more about what you can do with MyChart, go to [email or web address].    Your next appointment:   Follow up as needed   The format for your next appointment:   In Person  Provider:   Kate Sable, MD   Other Instructions       Signed, Kate Sable, MD  09/18/2020 12:25 PM    [place]

## 2020-09-18 NOTE — Patient Instructions (Signed)

## 2020-10-02 ENCOUNTER — Telehealth: Payer: Self-pay | Admitting: Family Medicine

## 2020-10-02 DIAGNOSIS — R413 Other amnesia: Secondary | ICD-10-CM

## 2020-10-02 DIAGNOSIS — I679 Cerebrovascular disease, unspecified: Secondary | ICD-10-CM

## 2020-10-02 DIAGNOSIS — R251 Tremor, unspecified: Secondary | ICD-10-CM

## 2020-10-02 NOTE — Telephone Encounter (Signed)
Patient was referred to a neurologist at [facility]. Patient had a stroke and she saw the neurologist. She went for an MRI and was told to go back to Dr.Cody to follow up.  Patient said she's shaky,trouble walking, tremors in her hand,dizzy and off balance.  Patient would like to be referred to a different neurologist. Patient will go to [place] or [place]. Whoever can see her the soonest.

## 2020-10-02 NOTE — Telephone Encounter (Signed)
MyChart to patient to get more information. Routing to MA to call if not reviewed in next 1-2 days

## 2020-10-03 NOTE — Addendum Note (Signed)
Addended by: [name] R on: 10/03/2020 02:03 PM   Modules accepted: Orders

## 2020-10-15 ENCOUNTER — Telehealth: Payer: Self-pay | Admitting: Family Medicine

## 2020-10-15 DIAGNOSIS — R251 Tremor, unspecified: Secondary | ICD-10-CM

## 2020-10-15 DIAGNOSIS — I679 Cerebrovascular disease, unspecified: Secondary | ICD-10-CM

## 2020-10-15 DIAGNOSIS — R413 Other amnesia: Secondary | ICD-10-CM

## 2020-10-15 NOTE — Telephone Encounter (Signed)
Dr Einar Pheasant I just sent you a referral message about this. Please review. I did not get denial notification and was not aware until patient called now to check on it. Thank you

## 2020-10-15 NOTE — Telephone Encounter (Signed)
Pt called in checking on a referral for neurology but it shows that it was denied.

## 2020-10-16 NOTE — Telephone Encounter (Signed)
Patient advised. Referral sent to [facility] office for review and scheduling.

## 2020-10-16 NOTE — Telephone Encounter (Signed)
Will place referral for [facility] for 2nd opinion to see if they will see patient.

## 2020-10-17 ENCOUNTER — Telehealth: Payer: Self-pay | Admitting: Family Medicine

## 2020-10-17 NOTE — Telephone Encounter (Signed)
She needs a follow-up visit if this a medication she is requesting. She can also reach out to neurology. Lorazepam is an anxiety medication and not a first line treatment for tremors

## 2020-10-17 NOTE — Telephone Encounter (Signed)
Patient is calling asking if we could call in a order of the following medication for her tremors: *Lorazepam 5 mg  Please advise patient EM  Pharmacy [facility] [place], [place]

## 2020-10-20 NOTE — Telephone Encounter (Signed)
Patient already scheduled for 12/29/20.

## 2020-11-06 ENCOUNTER — Other Ambulatory Visit: Payer: Self-pay

## 2020-11-06 ENCOUNTER — Ambulatory Visit (INDEPENDENT_AMBULATORY_CARE_PROVIDER_SITE_OTHER): Payer: 59 | Admitting: Family Medicine

## 2020-11-06 VITALS — BP 118/80 | HR 58 | Temp 97.6°F | Ht 65.0 in | Wt 266.5 lb

## 2020-11-06 DIAGNOSIS — R251 Tremor, unspecified: Secondary | ICD-10-CM

## 2020-11-06 DIAGNOSIS — H8110 Benign paroxysmal vertigo, unspecified ear: Secondary | ICD-10-CM

## 2020-11-06 MED ORDER — PROPRANOLOL HCL 10 MG PO TABS: ORAL_TABLET | ORAL | 0 refills | Status: DC

## 2020-11-06 NOTE — Assessment & Plan Note (Signed)
Pt notes some improvement with PT but immediate return of symptoms. Saw cards and they felt no orthostatic. Will refer to ENT to evaluate for other vertigo causes.

## 2020-11-06 NOTE — Assessment & Plan Note (Signed)
Has new neurology f/u next month. Will start propranolol at low dose due to dizziness and monitor for worsening increase to Propranolol 60-80 mg in divided doses 2-3 times per day. Start 10 mg bid and increase as tolerated. Did have response to lorazepam but discussed habit forming and would want to avoid if possible.

## 2020-11-06 NOTE — Patient Instructions (Signed)
Start Propranolol   Every 2-3 days if tolerating can consider increase  Day 1: 10 mg twice daily Day 4: Add 10 mg dose -- either take 20 mg morning, 10 mg at night  --- if noticing wearing off in a few hours then increase how often you take to three times a day  Typical dose is 60-80 mg (total) 2-4 times a day

## 2020-11-06 NOTE — Progress Notes (Signed)
Subjective:     Kristina Savage is a 60 y.o. female presenting for Tremors     HPI  #Tremor - took lorazepam with improvement in her tremor - persisting - wants to start treatment now   #vertigo - saw cards they thought no othostatic and bppv - is not seeing pt but her pt was concerned that symptoms improved but kept returning - has exercises that help - never saw ENT   Review of Systems   Social History   Tobacco Use  Smoking Status Former Smoker  . Packs/day: 0.50  . Years: 30.00  . Pack years: 15.00  . Types: Cigarettes  . Quit date: 06/03/2020  . Years since quitting: 0.4  Smokeless Tobacco Never Used        Objective:    BP Readings from Last 3 Encounters:  11/06/20 118/80  09/18/20 134/90  08/21/20 110/90   Wt Readings from Last 3 Encounters:  11/06/20 266 lb 8 oz (120.9 kg)  09/18/20 274 lb 2 oz (124.3 kg)  08/21/20 269 lb 8 oz (122.2 kg)    BP 118/80   Pulse (!) 58   Temp 97.6 F (36.4 C) (Temporal)   Ht 5\' 5"  (1.651 m)   Wt 266 lb 8 oz (120.9 kg)   SpO2 98%   BMI 44.35 kg/m    Physical Exam Constitutional:      General: She is not in acute distress.    Appearance: She is well-developed. She is not diaphoretic.  HENT:     Right Ear: External ear normal.     Left Ear: External ear normal.     Nose: Nose normal.  Eyes:     Conjunctiva/sclera: Conjunctivae normal.  Cardiovascular:     Rate and Rhythm: Normal rate.  Pulmonary:     Effort: Pulmonary effort is normal.  Musculoskeletal:     Cervical back: Neck supple.  Skin:    General: Skin is warm and dry.     Capillary Refill: Capillary refill takes less than 2 seconds.  Neurological:     Mental Status: She is alert. Mental status is at baseline.     Comments: Bilateral tremor, worse on the right. Some head tremor as well.   Psychiatric:        Mood and Affect: Mood normal.        Behavior: Behavior normal.           Assessment & Plan:   Problem List Items  Addressed This Visit      Nervous and Auditory   Benign paroxysmal positional vertigo    Pt notes some improvement with PT but immediate return of symptoms. Saw cards and they felt no orthostatic. Will refer to ENT to evaluate for other vertigo causes.       Relevant Orders   Ambulatory referral to ENT     Other   Tremor - Primary    Has new neurology f/u next month. Will start propranolol at low dose due to dizziness and monitor for worsening increase to Propranolol 60-80 mg in divided doses 2-3 times per day. Start 10 mg bid and increase as tolerated. Did have response to lorazepam but discussed habit forming and would want to avoid if possible.       Relevant Medications   propranolol (INDERAL) 10 MG tablet       Return if symptoms worsen or fail to improve.  Lesleigh Noe, MD  This visit occurred during the SARS-CoV-2 public health emergency.  Safety protocols were in place, including screening questions prior to the visit, additional usage of staff PPE, and extensive cleaning of exam room while observing appropriate contact time as indicated for disinfecting solutions.

## 2020-11-14 ENCOUNTER — Other Ambulatory Visit: Payer: Self-pay | Admitting: Family Medicine

## 2020-11-14 DIAGNOSIS — R251 Tremor, unspecified: Secondary | ICD-10-CM

## 2020-11-17 ENCOUNTER — Telehealth: Payer: Self-pay | Admitting: Family Medicine

## 2020-11-17 NOTE — Telephone Encounter (Signed)
Sent Dr. Verda Cumins message to pt via mychart.

## 2020-11-17 NOTE — Telephone Encounter (Signed)
Pt called and states that she has increased this medication to 30mg  TID for a week and the tremors have not stopped. Pt states that tremors have slowed down but havent stopped. Wondered if Dr. Einar Pheasant wanted to change anything before this is refilled.

## 2020-11-17 NOTE — Telephone Encounter (Signed)
Please let pt know I am changing her propranolol prescription.   New dose 40 mg  Start taking twice daily (as already taking 30 mg TID)  If tolerating and still tremors can increase to three times per day

## 2020-12-09 ENCOUNTER — Telehealth: Payer: Self-pay | Admitting: Family Medicine

## 2020-12-09 DIAGNOSIS — H8110 Benign paroxysmal vertigo, unspecified ear: Secondary | ICD-10-CM

## 2020-12-09 NOTE — Telephone Encounter (Signed)
Mrs. Hornbeck called in wanted to know if she can change her referral to [facility] due to the [place] location does not accept her insurance. And wanted to know if she can get in earlier due to she has been waiting for 3 weeks.   Please advise

## 2020-12-09 NOTE — Telephone Encounter (Signed)
New referral placed. Routing to referral coordinators as [facility]

## 2020-12-09 NOTE — Telephone Encounter (Signed)
[facility] office in [place] and confirmed that they take patient's insurance. Referral sent through [facility] to review and schedule with the patient. Called patient to let her know but voicemail was full and I could not leave a message.

## 2020-12-09 NOTE — Addendum Note (Signed)
Addended by: [name] R on: 12/09/2020 11:45 AM   Modules accepted: Orders

## 2020-12-09 NOTE — Telephone Encounter (Signed)
Notes updated in the referral WQ

## 2020-12-12 ENCOUNTER — Other Ambulatory Visit: Payer: Self-pay | Admitting: Family Medicine

## 2020-12-12 DIAGNOSIS — R251 Tremor, unspecified: Secondary | ICD-10-CM

## 2020-12-29 ENCOUNTER — Other Ambulatory Visit: Payer: Self-pay

## 2020-12-29 ENCOUNTER — Ambulatory Visit (INDEPENDENT_AMBULATORY_CARE_PROVIDER_SITE_OTHER): Payer: 59 | Admitting: Family Medicine

## 2020-12-29 ENCOUNTER — Encounter: Payer: Self-pay | Admitting: Family Medicine

## 2020-12-29 VITALS — BP 116/78 | HR 73 | Temp 97.9°F | Ht 65.0 in | Wt 275.0 lb

## 2020-12-29 DIAGNOSIS — R251 Tremor, unspecified: Secondary | ICD-10-CM

## 2020-12-29 DIAGNOSIS — R7303 Prediabetes: Secondary | ICD-10-CM | POA: Diagnosis not present

## 2020-12-29 DIAGNOSIS — Z124 Encounter for screening for malignant neoplasm of cervix: Secondary | ICD-10-CM

## 2020-12-29 DIAGNOSIS — H8110 Benign paroxysmal vertigo, unspecified ear: Secondary | ICD-10-CM

## 2020-12-29 DIAGNOSIS — F321 Major depressive disorder, single episode, moderate: Secondary | ICD-10-CM

## 2020-12-29 NOTE — Assessment & Plan Note (Addendum)
Following with ENT - has upcoming procedure to try and find a source. Thinks laying on her back is impacting this. Avoiding laying on back, continue with ENT work-up

## 2020-12-29 NOTE — Assessment & Plan Note (Signed)
Worsening depression. Unclear if parkinson's vs sinemet - she will ask neurology about expectations. Encourage therapy trial. If no improvement reach out and can consider changing medication. Cont prozac 60 mg daily

## 2020-12-29 NOTE — Patient Instructions (Addendum)
#  Prediabetes/Weight loss  - restart metformin 500 mg, increase to twice daily as tolerated - continue to work on exercise   Check with insurance on Shingrix Vaccine -- may also be available at the pharmacy   Call neurology about the symptoms and dose increase  -- Sinemet dose change -- expectations around depression - do they think you should consider med change or could it be a side effect  Depression  - try therapy - contact if you want to switch medications - if still no improving would probably recommend psychiatry consult

## 2020-12-29 NOTE — Progress Notes (Signed)
Subjective:     Kristina Savage is a 60 y.o. female presenting for Follow-up and Tremors (Patient recently diagnosed with Parkinson's )     HPI   #Parkinson - recently saw Dr. Manuella Ghazi - is better whenever she takes the tablet she gets symptom relief - 25-100 mg, 1 tablet TID (taking 4-5 hours apart)  - feels this is helping greatly  #ENT - vertigo - getting a VNG to work-up her vertigo - was unable to trigger her symptoms at the office - continues to get this on and off - when she gets up after long periods of laying flat will get shaky and dizzy with standing - this seems to trigger her symptoms  #Depression  - seems worse - not laughing as much - not sure if it is related to sinemet   Review of Systems   Social History   Tobacco Use  Smoking Status Former Smoker  . Packs/day: 0.50  . Years: 30.00  . Pack years: 15.00  . Types: Cigarettes  . Quit date: 06/03/2020  . Years since quitting: 0.5  Smokeless Tobacco Never Used        Objective:    BP Readings from Last 3 Encounters:  12/29/20 116/78  11/06/20 118/80  09/18/20 134/90   Wt Readings from Last 3 Encounters:  12/29/20 275 lb (124.7 kg)  11/06/20 266 lb 8 oz (120.9 kg)  09/18/20 274 lb 2 oz (124.3 kg)    BP 116/78   Pulse 73   Temp 97.9 F (36.6 C) (Temporal)   Ht 5\' 5"  (1.651 m)   Wt 275 lb (124.7 kg)   SpO2 97%   BMI 45.76 kg/m    Physical Exam Constitutional:      General: She is not in acute distress.    Appearance: She is well-developed. She is obese. She is not diaphoretic.  HENT:     Right Ear: External ear normal.     Left Ear: External ear normal.     Nose: Nose normal.  Eyes:     Conjunctiva/sclera: Conjunctivae normal.  Cardiovascular:     Rate and Rhythm: Normal rate.  Pulmonary:     Effort: Pulmonary effort is normal.  Musculoskeletal:     Cervical back: Neck supple.  Skin:    General: Skin is warm and dry.     Capillary Refill: Capillary refill takes less than 2  seconds.  Neurological:     Mental Status: She is alert. Mental status is at baseline.  Psychiatric:        Mood and Affect: Mood normal.        Behavior: Behavior normal.           Assessment & Plan:   Problem List Items Addressed This Visit      Nervous and Auditory   Benign paroxysmal positional vertigo - Primary    Following with ENT - has upcoming procedure to try and find a source. Thinks laying on her back is impacting this. Avoiding laying on back, continue with ENT work-up        Other   Prediabetes    Lab Results  Component Value Date   HGBA1C 6.2 05/15/2020  Restart metformin. Return 3 months       Morbid obesity ([place])    Restart metformin to help with weight loss and prediabetes.       Current moderate episode of major depressive disorder without prior episode ([place])    Worsening depression. Unclear if parkinson's  vs sinemet - she will ask neurology about expectations. Encourage therapy trial. If no improvement reach out and can consider changing medication. Cont prozac 60 mg daily      Tremor    Seeing Dr. Manuella Ghazi and recently started sinemet with significant improvement in symptoms - still undergoing work-up for likely parkinson's disease. Advised reaching out to his office as medication is wearing off for dose change.        Other Visit Diagnoses    Cervical cancer screening       Relevant Orders   Ambulatory referral to Obstetrics / Gynecology       Return in about 3 months (around 03/31/2021) for annual exam .  Lesleigh Noe, MD  This visit occurred during the SARS-CoV-2 public health emergency.  Safety protocols were in place, including screening questions prior to the visit, additional usage of staff PPE, and extensive cleaning of exam room while observing appropriate contact time as indicated for disinfecting solutions.

## 2020-12-29 NOTE — Assessment & Plan Note (Signed)
Restart metformin to help with weight loss and prediabetes.

## 2020-12-29 NOTE — Assessment & Plan Note (Signed)
Seeing Dr. Manuella Ghazi and recently started sinemet with significant improvement in symptoms - still undergoing work-up for likely parkinson's disease. Advised reaching out to his office as medication is wearing off for dose change.

## 2020-12-29 NOTE — Assessment & Plan Note (Signed)
Lab Results  Component Value Date   HGBA1C 6.2 05/15/2020  Restart metformin. Return 3 months

## 2021-01-02 DIAGNOSIS — G20 Parkinson's disease: Secondary | ICD-10-CM | POA: Insufficient documentation

## 2021-01-14 ENCOUNTER — Other Ambulatory Visit: Payer: Self-pay

## 2021-01-14 ENCOUNTER — Other Ambulatory Visit (HOSPITAL_COMMUNITY)
Admission: RE | Admit: 2021-01-14 | Discharge: 2021-01-14 | Disposition: A | Discharge status: Home / Self Care | Payer: 59 | Source: Ambulatory Visit | Attending: Family Medicine | Admitting: Family Medicine

## 2021-01-14 ENCOUNTER — Encounter: Payer: Self-pay | Admitting: Family Medicine

## 2021-01-14 ENCOUNTER — Ambulatory Visit (INDEPENDENT_AMBULATORY_CARE_PROVIDER_SITE_OTHER): Payer: 59 | Admitting: Family Medicine

## 2021-01-14 VITALS — BP 124/81 | HR 102 | Ht 65.0 in | Wt 266.6 lb

## 2021-01-14 DIAGNOSIS — Z124 Encounter for screening for malignant neoplasm of cervix: Secondary | ICD-10-CM

## 2021-01-14 DIAGNOSIS — Z01419 Encounter for gynecological examination (general) (routine) without abnormal findings: Secondary | ICD-10-CM

## 2021-01-14 DIAGNOSIS — Z01411 Encounter for gynecological examination (general) (routine) with abnormal findings: Secondary | ICD-10-CM

## 2021-01-14 NOTE — Progress Notes (Signed)
Subjective:     Kristina Savage is a 60 y.o. female and is here for a comprehensive physical exam. The patient reports no problems.      The following portions of the patient's history were reviewed and updated as appropriate: allergies, current medications, past family history, past medical history, past social history, past surgical history, and problem list.  Review of Systems Pertinent items noted in HPI and remainder of comprehensive ROS otherwise negative.   Objective:    BP 124/81   Pulse (!) 102   Ht 5\' 5"  (1.651 m)   Wt 266 lb 9.6 oz (120.9 kg)   BMI 44.36 kg/m  General appearance: alert, cooperative, appears stated age, and moderately obese Head: Normocephalic, without obvious abnormality, atraumatic Neck: no adenopathy, supple, symmetrical, trachea midline, and thyroid not enlarged, symmetric, no tenderness/mass/nodules Lungs: clear to auscultation bilaterally Breasts: normal appearance, no masses or tenderness Heart: regular rate and rhythm, S1, S2 normal, no murmur, click, rub or gallop Abdomen: soft, non-tender; bowel sounds normal; no masses,  no organomegaly Pelvic: cervix normal in appearance, external genitalia normal, no adnexal masses or tenderness, no cervical motion tenderness, uterus normal size, shape, and consistency, and vagina normal without discharge Extremities: extremities normal, atraumatic, no cyanosis or edema Pulses: 2+ and symmetric Skin: Skin color, texture, turgor normal. No rashes or lesions or cherry hemangioma that is enlarged  on right thigh Lymph nodes: Cervical, supraclavicular, and axillary nodes normal. Neurologic: parkinsonian tremor    Assessment:   GYN female exam.      Plan:    Encounter for gynecological examination with abnormal finding  Screening for malignant neoplasm of cervix - Plan: Cytology - PAP( ) Return in 1 year (on 01/14/2022).  See After Visit Summary for Counseling Recommendations

## 2021-01-16 ENCOUNTER — Ambulatory Visit (HOSPITAL_COMMUNITY)
Admission: EM | Admit: 2021-01-16 | Discharge: 2021-01-16 | Disposition: A | Discharge status: Home / Self Care | Payer: 59 | Attending: Urgent Care | Admitting: Urgent Care

## 2021-01-16 ENCOUNTER — Encounter (HOSPITAL_COMMUNITY): Payer: Self-pay

## 2021-01-16 ENCOUNTER — Other Ambulatory Visit: Payer: Self-pay

## 2021-01-16 DIAGNOSIS — K130 Diseases of lips: Secondary | ICD-10-CM | POA: Diagnosis not present

## 2021-01-16 DIAGNOSIS — H65191 Other acute nonsuppurative otitis media, right ear: Secondary | ICD-10-CM

## 2021-01-16 MED ORDER — CEFDINIR 300 MG PO CAPS: 300.0000 mg | ORAL_CAPSULE | Freq: Two times a day (BID) | ORAL | 0 refills | Status: DC

## 2021-01-16 MED ORDER — VALACYCLOVIR HCL 1 G PO TABS: 1000.0000 mg | ORAL_TABLET | Freq: Three times a day (TID) | ORAL | 0 refills | Status: DC

## 2021-01-16 NOTE — ED Provider Notes (Signed)
[facility]   MRN: [id number] DOB: [birth year]  Subjective:   Kristina Savage is a 60 y.o. female presenting for 1 day history of right-sided penetrating head pain, started having right ear pain 3 days ago.  Feels a lot of pressure on that side of her head that radiates down into her right jaw and neck.  Has had some difficulty with her balance, dizziness.  Denies fever, ear drainage, tinnitus.  She is also had 1 week history of multiple blisters all over her lower lip, has associated pain.  Denies history of HSV infection.  No current facility-administered medications for this encounter.  Current Outpatient Medications:    atorvastatin (LIPITOR) 10 MG tablet, Take 1 tablet (10 mg total) by mouth daily., Disp: 90 tablet, Rfl: 3   carbidopa-levodopa (SINEMET IR) 25-100 MG tablet, Take 1 tablet by mouth 3 (three) times daily., Disp: , Rfl:    ergocalciferol (VITAMIN D2) 1.25 MG (50000 UT) capsule, Take 1 capsule by mouth once a week., Disp: , Rfl:    FLUoxetine (PROZAC) 20 MG capsule, Take 3 capsules (60 mg total) by mouth daily., Disp: 270 capsule, Rfl: 3   metFORMIN (GLUCOPHAGE) 500 MG tablet, Take 1 tablet by mouth 2 (two) times daily., Disp: , Rfl:    aspirin EC 81 MG tablet, Take 81 mg by mouth daily. Swallow whole., Disp: , Rfl:    ibuprofen (ADVIL) 600 MG tablet, Take 1 tablet (600 mg total) by mouth every 6 (six) hours as needed., Disp: 30 tablet, Rfl: 0   Vitamin D, Ergocalciferol, (DRISDOL) 1.25 MG (50000 UNIT) CAPS capsule, Take 1 capsule by mouth once a week., Disp: , Rfl:    Allergies  Allergen Reactions   Oxycodone-Acetaminophen Nausea And Vomiting   Percocet [Oxycodone-Acetaminophen] Nausea And Vomiting   Pollen Extract     Nasal congestion    Past Medical History:  Diagnosis Date   Anxiety    Chicken pox    Depression    Diverticulitis    Dizziness    Genital warts    GERD (gastroesophageal reflux disease)    Hay fever    Hyperlipidemia     Ovarian cyst    Pre-diabetes    Stroke [facility])    UTI (lower urinary tract infection)      Past Surgical History:  Procedure Laterality Date   CERVIX REMOVAL     Laser surgery, not complete removal   CESAREAN SECTION     CHONDROPLASTY Right 06/11/2020   Procedure: CHONDROPLASTY;  Surgeon: Netta Cedars, MD;  Location: [place];  Service: Orthopedics;  Laterality: Right;   KNEE ARTHROSCOPY WITH MEDIAL MENISECTOMY Right 06/11/2020   Procedure: KNEE ARTHROSCOPY WITH MEDIAL MENISECTOMY;  Surgeon: Netta Cedars, MD;  Location: [facility];  Service: Orthopedics;  Laterality: Right;    Family History  Problem Relation Age of Onset   Diabetes Paternal Grandmother    Parkinson's disease Paternal Grandmother    Breast cancer Maternal Grandmother    Arthritis Father    Thyroid disease Father        thinks cancer   Parkinson's disease Father    Arthritis Mother    Hypertension Mother    COPD Mother    Heart failure Mother    Diabetes Son    Cancer Maternal Aunt        Female cancer - not sure what   Breast cancer Maternal Aunt    Prostate cancer Maternal Uncle  Social History   Tobacco Use   Smoking status: Former    Packs/day: 0.50    Years: 30.00    Pack years: 15.00    Types: Cigarettes    Quit date: 06/03/2020    Years since quitting: 0.6   Smokeless tobacco: Never  Substance Use Topics   Alcohol use: Not Currently    Alcohol/week: 0.0 standard drinks    Comment: once a year   Drug use: No    ROS   Objective:   Vitals: BP (!) 145/96 (BP Location: Right Arm)   Pulse 87   Temp 98.3 F (36.8 C) (Oral)   Resp 20   SpO2 96%   Physical Exam Constitutional:      General: She is not in acute distress.    Appearance: Normal appearance. She is well-developed. She is not ill-appearing, toxic-appearing or diaphoretic.  HENT:     Head: Normocephalic and atraumatic.     Right Ear: No drainage or tenderness. No middle ear effusion.  Tympanic membrane is not erythematous.     Left Ear: Tympanic membrane, ear canal and external ear normal. No drainage or tenderness.  No middle ear effusion. There is no impacted cerumen. Tympanic membrane is not erythematous.     Ears:     Comments: Bulging and erythematous TM worse anteriorly.  Otherwise it is intact.  Tragus tenderness.    Nose: Nose normal. No congestion or rhinorrhea.     Mouth/Throat:     Mouth: Mucous membranes are moist. No oral lesions.     Pharynx: No pharyngeal swelling, oropharyngeal exudate, posterior oropharyngeal erythema or uvula swelling.     Tonsils: No tonsillar exudate or tonsillar abscesses.     Comments: Lower lip with multiple crusted resolving and active developing vesicular lesions across the entire lip. Eyes:     General: No scleral icterus.    Extraocular Movements: Extraocular movements intact.     Right eye: Normal extraocular motion.     Left eye: Normal extraocular motion.     Conjunctiva/sclera: Conjunctivae normal.     Pupils: Pupils are equal, round, and reactive to light.  Cardiovascular:     Rate and Rhythm: Normal rate.  Pulmonary:     Effort: Pulmonary effort is normal.  Musculoskeletal:     Cervical back: Normal range of motion and neck supple.  Lymphadenopathy:     Cervical: No cervical adenopathy.  Skin:    General: Skin is warm and dry.  Neurological:     General: No focal deficit present.     Mental Status: She is alert and oriented to person, place, and time.  Psychiatric:        Mood and Affect: Mood normal.        Behavior: Behavior normal.      Assessment and Plan :   PDMP not reviewed this encounter.  1. Other non-recurrent acute nonsuppurative otitis media of right ear   2. Rash on lips     Start cefdinir to cover for otitis media. Use supportive care otherwise.  Suspect rash on the left is HSV infection and therefore will have patient start Valtrex.  Counseled patient on potential for adverse effects with  medications prescribed/recommended today, ER and return-to-clinic precautions discussed, patient verbalized understanding.    Jaynee Eagles, PA-C 01/17/21 1014

## 2021-01-16 NOTE — ED Triage Notes (Signed)
Pt c/o right side of head hurting, pt states it feels like an ear infection. Pt also c/o bottle lip being scabbed. Pain states ear pain started Wednesday, lips started to scab over a week ago (believes it is from being sunburned). Compresses applied to lips with no relief per patient. Patient hasn't taken anything for her head pain. Pt states she feels a little off, balance feels a little off from normal (does have Parkinson's disease). Hearing ability has not changed per patient.

## 2021-01-19 LAB — CYTOLOGY - PAP
Comment: NEGATIVE
Diagnosis: NEGATIVE
High risk HPV: NEGATIVE

## 2021-01-22 ENCOUNTER — Ambulatory Visit: Payer: Self-pay | Admitting: Neurology

## 2021-03-10 ENCOUNTER — Ambulatory Visit (INDEPENDENT_AMBULATORY_CARE_PROVIDER_SITE_OTHER): Payer: 59 | Admitting: Family Medicine

## 2021-03-10 ENCOUNTER — Encounter: Payer: Self-pay | Admitting: Family Medicine

## 2021-03-10 ENCOUNTER — Other Ambulatory Visit: Payer: Self-pay

## 2021-03-10 VITALS — BP 122/82 | HR 65 | Temp 98.0°F | Ht 64.0 in | Wt 271.8 lb

## 2021-03-10 DIAGNOSIS — Z0000 Encounter for general adult medical examination without abnormal findings: Secondary | ICD-10-CM | POA: Diagnosis not present

## 2021-03-10 DIAGNOSIS — R7303 Prediabetes: Secondary | ICD-10-CM | POA: Diagnosis not present

## 2021-03-10 DIAGNOSIS — Z114 Encounter for screening for human immunodeficiency virus [HIV]: Secondary | ICD-10-CM

## 2021-03-10 DIAGNOSIS — Z1159 Encounter for screening for other viral diseases: Secondary | ICD-10-CM

## 2021-03-10 DIAGNOSIS — E782 Mixed hyperlipidemia: Secondary | ICD-10-CM

## 2021-03-10 DIAGNOSIS — Z23 Encounter for immunization: Secondary | ICD-10-CM

## 2021-03-10 NOTE — Patient Instructions (Signed)
Preventive Care 58-58 Years Old, Female Preventive care refers to lifestyle choices and visits with your health care provider that can promote health and wellness. This includes: A yearly physical exam. This is also called an annual wellness visit. Regular dental and eye exams. Immunizations. Screening for certain conditions. Healthy lifestyle choices, such as: Eating a healthy diet. Getting regular exercise. Not using drugs or products that contain nicotine and tobacco. Limiting alcohol use. What can I expect for my preventive care visit? Physical exam Your health care provider will check your: Height and weight. These may be used to calculate your BMI (body mass index). BMI is a measurement that tells if you are at a healthy weight. Heart rate and blood pressure. Body temperature. Skin for abnormal spots. Counseling Your health care provider may ask you questions about your: Past medical problems. Family's medical history. Alcohol, tobacco, and drug use. Emotional well-being. Home life and relationship well-being. Sexual activity. Diet, exercise, and sleep habits. Work and work Statistician. Access to firearms. Method of birth control. Menstrual cycle. Pregnancy history. What immunizations do I need?  Vaccines are usually given at various ages, according to a schedule. Your health care provider will recommend vaccines for you based on your age, medicalhistory, and lifestyle or other factors, such as travel or where you work. What tests do I need? Blood tests Lipid and cholesterol levels. These may be checked every 5 years, or more often if you are over 37 years old. Hepatitis C test. Hepatitis B test. Screening Lung cancer screening. You may have this screening every year starting at age 30 if you have a 30-pack-year history of smoking and currently smoke or have quit within the past 15 years. Colorectal cancer screening. All adults should have this screening starting at  age 23 and continuing until age 3. Your health care provider may recommend screening at age 88 if you are at increased risk. You will have tests every 1-10 years, depending on your results and the type of screening test. Diabetes screening. This is done by checking your blood sugar (glucose) after you have not eaten for a while (fasting). You may have this done every 1-3 years. Mammogram. This may be done every 1-2 years. Talk with your health care provider about when you should start having regular mammograms. This may depend on whether you have a family history of breast cancer. BRCA-related cancer screening. This may be done if you have a family history of breast, ovarian, tubal, or peritoneal cancers. Pelvic exam and Pap test. This may be done every 3 years starting at age 79. Starting at age 54, this may be done every 5 years if you have a Pap test in combination with an HPV test. Other tests STD (sexually transmitted disease) testing, if you are at risk. Bone density scan. This is done to screen for osteoporosis. You may have this scan if you are at high risk for osteoporosis. Talk with your health care provider about your test results, treatment options,and if necessary, the need for more tests. Follow these instructions at home: Eating and drinking  Eat a diet that includes fresh fruits and vegetables, whole grains, lean protein, and low-fat dairy products. Take vitamin and mineral supplements as recommended by your health care provider. Do not drink alcohol if: Your health care provider tells you not to drink. You are pregnant, may be pregnant, or are planning to become pregnant. If you drink alcohol: Limit how much you have to 0-1 drink a day. Be aware  of how much alcohol is in your drink. In the [place] one drink equals one 12 oz bottle of beer (355 mL), one 5 oz glass of wine (148 mL), or one 1 oz glass of hard liquor (44 mL).  Lifestyle Take daily care of your teeth and  gums. Brush your teeth every morning and night with fluoride toothpaste. Floss one time each day. Stay active. Exercise for at least 30 minutes 5 or more days each week. Do not use any products that contain nicotine or tobacco, such as cigarettes, e-cigarettes, and chewing tobacco. If you need help quitting, ask your health care provider. Do not use drugs. If you are sexually active, practice safe sex. Use a condom or other form of protection to prevent STIs (sexually transmitted infections). If you do not wish to become pregnant, use a form of birth control. If you plan to become pregnant, see your health care provider for a prepregnancy visit. If told by your health care provider, take low-dose aspirin daily starting at age 29. Find healthy ways to cope with stress, such as: Meditation, yoga, or listening to music. Journaling. Talking to a trusted person. Spending time with friends and family. Safety Always wear your seat belt while driving or riding in a vehicle. Do not drive: If you have been drinking alcohol. Do not ride with someone who has been drinking. When you are tired or distracted. While texting. Wear a helmet and other protective equipment during sports activities. If you have firearms in your house, make sure you follow all gun safety procedures. What's next? Visit your health care provider once a year for an annual wellness visit. Ask your health care provider how often you should have your eyes and teeth checked. Stay up to date on all vaccines. This information is not intended to replace advice given to you by your health care provider. Make sure you discuss any questions you have with your healthcare provider. Document Revised: 04/15/2020 Document Reviewed: 03/23/2018 Elsevier Patient Education  2022 [employer].

## 2021-03-10 NOTE — Addendum Note (Signed)
Addended by: Cloyd Stagers on: 03/10/2021 04:49 PM   Modules accepted: Orders

## 2021-03-10 NOTE — Progress Notes (Signed)
Annual Exam   Chief Complaint:  Chief Complaint  Patient presents with   Annual Exam    History of Present Illness:  Ms. Kristina Savage is a 60 y.o. [phone] who LMP was No LMP recorded. Patient is postmenopausal., presents today for her annual examination.    Tremor - seeing neurology - has had to stop medication - is going to [facility] for evaluation - mood is impacted due to this  Nutrition She does get adequate calcium and Vitamin D in her diet. Diet: not good Exercise: not currently doing anything  Safety The patient wears seatbelts: yes.     The patient feels safe at home and in their relationships: yes.   Menstrual:  Symptoms of menopause: none   GYN She is single partner, contraception - post menopausal status.    Cervical Cancer Screening (21-65):   Last Pap:   June 2022 Results were: no abnormalities /neg HPV DNA   Breast Cancer Screening (Age 82-74):  There is FH of breast cancer. There is no FH of ovarian cancer. BRCA screening Yes.  Last Mammogram: 04/2020 The patient does want a mammogram this year.    Colon Cancer Screening:  Age 48-75 yo - benefits outweigh the risk. Adults 59-85 yo who have never been screened benefit.  Benefits: 134000 people in 2016 will be diagnosed and 49,000 will die - early detection helps [place]: Complications 2/2 to colonoscopy High Risk (Colonoscopy): genetic disorder (Lynch syndrome or familial adenomatous polyposis), personal hx of IBD, previous adenomatous polyp, or previous colorectal cancer, FamHx start 10 years before the age at diagnosis, increased in males and black race  Options:  FIT - looks for hemoglobin (blood in the stool) - specific and fairly sensitive - must be done annually Cologuard - looks for DNA and blood - more sensitive - therefore can have more false positives, every 3 years Colonoscopy - every 10 years if normal - sedation, bowl prep, must have someone drive you  Shared decision making and the patient  had decided to do colonoscopy.   Social History   Tobacco Use  Smoking Status Former   Packs/day: 0.50   Years: 30.00   Pack years: 15.00   Types: Cigarettes   Quit date: 06/03/2020   Years since quitting: 0.7  Smokeless Tobacco Never    Lung Cancer Screening (Ages 95-18): not applicable 20 year pack history? No Current Tobacco user? Yes Quit less than 15 years ago? No Interested in low dose CT for lung cancer screening? N/a  Weight Wt Readings from Last 3 Encounters:  03/10/21 271 lb 12 oz (123.3 kg)  01/14/21 266 lb 9.6 oz (120.9 kg)  12/29/20 275 lb (124.7 kg)   Patient has very high BMI  BMI Readings from Last 1 Encounters:  03/10/21 46.65 kg/m     Chronic disease screening Blood pressure monitoring:  BP Readings from Last 3 Encounters:  03/10/21 122/82  01/16/21 (!) 145/96  01/14/21 124/81    Lipid Monitoring: Indication for screening: age >109, obesity, diabetes, family hx, CV risk factors.  Lipid screening: Yes  Lab Results  Component Value Date   CHOL 196 05/15/2020   HDL 62.10 05/15/2020   LDLCALC 118 (H) 05/15/2020   TRIG 79.0 05/15/2020   CHOLHDL 3 05/15/2020     Diabetes Screening: age >48, overweight, family hx, PCOS, hx of gestational diabetes, at risk ethnicity Diabetes Screening screening: Yes  Lab Results  Component Value Date   HGBA1C 6.2 05/15/2020     Past  Medical History:  Diagnosis Date   Anxiety    Chicken pox    Depression    Diverticulitis    Dizziness    Genital warts    GERD (gastroesophageal reflux disease)    Hay fever    Hyperlipidemia    Ovarian cyst    Pre-diabetes    Stroke [facility])    UTI (lower urinary tract infection)     Past Surgical History:  Procedure Laterality Date   CERVIX REMOVAL     Laser surgery, not complete removal   CESAREAN SECTION     CHONDROPLASTY Right 06/11/2020   Procedure: CHONDROPLASTY;  Surgeon: Netta Cedars, MD;  Location: [place];  Service: Orthopedics;   Laterality: Right;   KNEE ARTHROSCOPY WITH MEDIAL MENISECTOMY Right 06/11/2020   Procedure: KNEE ARTHROSCOPY WITH MEDIAL MENISECTOMY;  Surgeon: Netta Cedars, MD;  Location: [place];  Service: Orthopedics;  Laterality: Right;    Prior to Admission medications   Medication Sig Start Date End Date Taking? Authorizing Provider  aspirin EC 81 MG tablet Take 81 mg by mouth daily. Swallow whole.   Yes [provider]  atorvastatin (LIPITOR) 10 MG tablet Take 1 tablet (10 mg total) by mouth daily. 08/21/20  Yes Lesleigh Noe, MD  ergocalciferol (VITAMIN D2) 1.25 MG (50000 UT) capsule Take 1 capsule by mouth once a week. 01/08/21  Yes [provider]  FLUoxetine (PROZAC) 20 MG capsule Take 3 capsules (60 mg total) by mouth daily. 08/21/20  Yes Lesleigh Noe, MD  ibuprofen (ADVIL) 600 MG tablet Take 1 tablet (600 mg total) by mouth every 6 (six) hours as needed. 10/12/19  Yes Lamptey, Myrene Galas, MD  metFORMIN (GLUCOPHAGE) 500 MG tablet Take 1 tablet by mouth 2 (two) times daily. 08/20/20  Yes [provider]  Vitamin D, Ergocalciferol, (DRISDOL) 1.25 MG (50000 UNIT) CAPS capsule Take 1 capsule by mouth once a week. 01/08/21  Yes [provider]  carbidopa-levodopa (SINEMET IR) 25-100 MG tablet Take 1 tablet by mouth 3 (three) times daily. Patient not taking: Reported on 03/10/2021 12/17/20   [provider]  PROVENTIL HFA 108 (90 BASE) MCG/ACT inhaler Inhale 2 puffs into the lungs daily as needed for shortness of breath.  05/17/14 05/01/19  [provider]    Allergies  Allergen Reactions   Oxycodone-Acetaminophen Nausea And Vomiting   Percocet [Oxycodone-Acetaminophen] Nausea And Vomiting   Pollen Extract     Nasal congestion    Gynecologic History: No LMP recorded. Patient is postmenopausal.  Obstetric History: [id number]  Social History   Socioeconomic History   Marital status: Married    Spouse name: [name]   Number of  children: 7   Years of education: High school   Highest education level: Not on file  Occupational History   Occupation: CSR  Tobacco Use   Smoking status: Former    Packs/day: 0.50    Years: 30.00    Pack years: 15.00    Types: Cigarettes    Quit date: 06/03/2020    Years since quitting: 0.7   Smokeless tobacco: Never  Vaping Use   Vaping Use: Not on file  Substance and Sexual Activity   Alcohol use: Not Currently    Alcohol/week: 0.0 standard drinks    Comment: once a year   Drug use: No   Sexual activity: Yes    Birth control/protection: Post-menopausal  Other Topics Concern   Not on file  Social History Narrative   04/24/20  From: [place] - moved to [place] to raise her family   Living: with husband, [name] (1991)   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      Family: 7 children - including step children - twin boys nearby and daughter nearby - 10 grandchildren      Enjoys: spend time with family      Exercise: knee limiting exercise - tries to walk   Diet: eats whatever      Safety   Seat belts: Yes    Guns: Yes  and secure   Safe in relationships: Yes    Social Determinants of Radio broadcast assistant Strain: Not on file  Food Insecurity: Not on file  Transportation Needs: Not on file  Physical Activity: Not on file  Stress: Not on file  Social Connections: Not on file  Intimate Partner Violence: Not on file    Family History  Problem Relation Age of Onset   Arthritis Mother    Hypertension Mother    COPD Mother    Heart failure Mother    Arthritis Father    Thyroid disease Father        thinks cancer   Parkinson's disease Father    Diabetes Son    Cancer Maternal Aunt        Female cancer - not sure what   Breast cancer Maternal Aunt    Prostate cancer Maternal Uncle    Colon cancer Maternal Uncle 45   Breast cancer Maternal Grandmother    Diabetes Paternal Grandmother    Parkinson's disease Paternal Grandmother     Review  of Systems  Constitutional:  Negative for chills and fever.  HENT:  Negative for congestion and sore throat.   Eyes:  Negative for blurred vision and double vision.  Respiratory:  Negative for shortness of breath.   Cardiovascular:  Negative for chest pain.  Gastrointestinal:  Negative for heartburn, nausea and vomiting.  Genitourinary: Negative.   Musculoskeletal: Negative.  Negative for myalgias.  Skin:  Negative for rash.  Neurological:  Positive for tremors. Negative for dizziness and headaches.  Endo/Heme/Allergies:  Does not bruise/bleed easily.  Psychiatric/Behavioral:  Positive for depression. The patient is not nervous/anxious.     Physical Exam BP 122/82   Pulse 65   Temp 98 F (36.7 C) (Temporal)   Ht 5' 4"  (1.626 m)   Wt 271 lb 12 oz (123.3 kg)   SpO2 98%   BMI 46.65 kg/m    BP Readings from Last 3 Encounters:  03/10/21 122/82  01/16/21 (!) 145/96  01/14/21 124/81      Physical Exam Constitutional:      General: She is not in acute distress.    Appearance: She is well-developed. She is not diaphoretic.  HENT:     Head: Normocephalic and atraumatic.     Right Ear: External ear normal.     Left Ear: External ear normal.     Nose: Nose normal.  Eyes:     General: No scleral icterus.    Extraocular Movements: Extraocular movements intact.     Conjunctiva/sclera: Conjunctivae normal.  Cardiovascular:     Rate and Rhythm: Normal rate and regular rhythm.     Heart sounds: No murmur heard. Pulmonary:     Effort: Pulmonary effort is normal. No respiratory distress.     Breath sounds: Normal breath sounds. No wheezing.  Abdominal:     General: Bowel sounds are normal. There is no distension.  Palpations: Abdomen is soft. There is no mass.     Tenderness: no abdominal tenderness There is no guarding or rebound.  Musculoskeletal:        General: Normal range of motion.     Cervical back: Neck supple.  Lymphadenopathy:     Cervical: No cervical  adenopathy.  Skin:    General: Skin is warm and dry.     Capillary Refill: Capillary refill takes less than 2 seconds.  Neurological:     Mental Status: She is alert and oriented to person, place, and time.     Motor: Tremor (severe on left hand, also present with mouth and tongue) present.     Deep Tendon Reflexes: Reflexes normal.  Psychiatric:        Mood and Affect: Mood normal.        Behavior: Behavior normal.    Results:  PHQ-9:  [place] Office Visit from 12/29/2020 in [place] at [place]  PHQ-9 Total Score 4         Assessment: 60 y.o. [phone] female here for routine annual physical examination.  Plan: Problem List Items Addressed This Visit       Other   Prediabetes   Relevant Orders   Hemoglobin A1c   Hyperlipidemia   Relevant Orders   Lipid panel   Other Visit Diagnoses     Annual physical exam    -  Primary   Need for shingles vaccine       Relevant Orders   Varicella-zoster vaccine IM (Completed)   Need for hepatitis C screening test       Relevant Orders   Hepatitis C antibody   Encounter for screening for HIV       Relevant Orders   HIV Antibody (routine testing w rflx)       Screening: -- Blood pressure screen normal -- cholesterol screening: will obtain -- Weight screening: obese: discussed management options, including lifestyle, dietary, and exercise. -- Diabetes Screening: will obtain -- Nutrition: Encouraged healthy diet  The ASCVD Risk score Mikey Bussing DC Jr., et al., 2013) failed to calculate for the following reasons:   The patient has a prior MI or stroke diagnosis  -- Statin therapy for Age 43-75 with CVD risk >7.5%  Psych -- Depression screening (PHQ-9):  [place] Office Visit from 12/29/2020 in [place] at [facility]  PHQ-9 Total Score 4        Safety -- tobacco screening:  encouraged nicotine gum for cravings -- alcohol screening:  low-risk usage. -- no evidence of domestic violence  or intimate partner violence.   Cancer Screening -- pap smear not collected per ASCCP guidelines -- family history of breast cancer screening: done. not at high risk. -- Mammogram -  encouraged calling to schedule -- Colon cancer (age 56+)--  will check with GI  Immunizations Immunization History  Administered Date(s) Administered   Influenza, Seasonal, Injecte, Preservative Fre 05/20/2006   Influenza,inj,Quad PF,6+ Mos 05/15/2020   Influenza-Unspecified 05/01/2014, 05/14/2015   Moderna Sars-Covid-2 Vaccination 11/21/2019, 12/19/2019, 07/15/2020   Tdap 05/15/2020   Zoster Recombinat (Shingrix) 03/10/2021    -- flu vaccine up to date -- TDAP q10 years up to date -- Shingles (age >80) not up to date - given today -- PPSV-23 (19-64 with chronic disease or smoking) will consider -- Covid-19 Vaccine up to date   Encouraged healthy diet and exercise. Encouraged regular vision and dental care.    Lesleigh Noe, MD

## 2021-03-11 LAB — LIPID PANEL
Cholesterol: 154 mg/dL (ref 0–200)
HDL: 63.9 mg/dL (ref >39.00)
LDL Cholesterol: 73 mg/dL (ref 0–99)
NonHDL: 89.8
Total CHOL/HDL Ratio: 2
Triglycerides: 82 mg/dL (ref 0.0–149.0)
VLDL: 16.4 mg/dL (ref 0.0–40.0)

## 2021-03-11 LAB — HEPATITIS C ANTIBODY
Hepatitis C Ab: NONREACTIVE
SIGNAL TO CUT-OFF: 0.02 (ref <1.00)

## 2021-03-11 LAB — HIV ANTIBODY (ROUTINE TESTING W REFLEX): HIV 1&2 Ab, 4th Generation: NONREACTIVE

## 2021-06-08 ENCOUNTER — Other Ambulatory Visit: Payer: Self-pay | Admitting: Family Medicine

## 2021-06-08 DIAGNOSIS — Z1231 Encounter for screening mammogram for malignant neoplasm of breast: Secondary | ICD-10-CM

## 2021-07-09 ENCOUNTER — Ambulatory Visit
Admission: RE | Admit: 2021-07-09 | Discharge: 2021-07-09 | Disposition: A | Discharge status: Home / Self Care | Payer: 59 | Source: Ambulatory Visit | Attending: Family Medicine | Admitting: Family Medicine

## 2021-07-09 DIAGNOSIS — Z1231 Encounter for screening mammogram for malignant neoplasm of breast: Secondary | ICD-10-CM

## 2021-07-10 ENCOUNTER — Telehealth: Payer: Self-pay | Admitting: Family Medicine

## 2021-07-10 ENCOUNTER — Other Ambulatory Visit: Payer: Self-pay | Admitting: Family Medicine

## 2021-07-10 DIAGNOSIS — E782 Mixed hyperlipidemia: Secondary | ICD-10-CM

## 2021-07-10 DIAGNOSIS — I679 Cerebrovascular disease, unspecified: Secondary | ICD-10-CM

## 2021-07-10 MED ORDER — BENZONATATE 100 MG PO CAPS: 100.0000 mg | ORAL_CAPSULE | Freq: Three times a day (TID) | ORAL | 0 refills | Status: DC | PRN

## 2021-07-10 MED ORDER — NIRMATRELVIR/RITONAVIR (PAXLOVID)TABLET: 3.0000 | ORAL_TABLET | Freq: Two times a day (BID) | ORAL | 0 refills | Status: AC

## 2021-07-10 NOTE — Telephone Encounter (Signed)
Spoke with husband who is my patient who tested positive for COVID today. Wife also tested positive and he requested I speak with her.  First day of symptoms: 07/06/2021 Tested COVID positive: today  Current symptoms: coughing fits, congestion, fatigue, wheezing, HA, body aches No: fevers/chills, loss of taste/smell, abd pain or nausea.  Treatments to date: dayquil, nyquil Risk factors include: rare smoker, obesity, parkinson's, depression, h/o CVA  COVID vaccination status: Moderna x3 (10/2019, 11/2019, booster 06/2020)   Cr 0.8, GFR 74 (11/2020)  Reviewed currently approved EUA treatments.  Reviewed expected course of illness, anticipated course of recovery, as well as red flags to suggest COVID pneumonia and/or to seek urgent in-person care. Reviewed latest CDC isolation/quarantine guidelines.  Encouraged fluids and rest. Reviewed further supportive care measures at home including vit C 500mg  bid, vit D 2000 IU daily, zinc 100mg  daily, tylenol PRN, pepcid 20mg  BID PRN.   Recommend:  Full dose paxlovid.  Paxlovid drug interactions:  Atorvastatin - hold while on paxlovid and 2 days afterwards.  Tessalon perls for cough

## 2021-07-13 ENCOUNTER — Encounter: Payer: Self-pay | Admitting: Family Medicine

## 2021-07-13 ENCOUNTER — Ambulatory Visit (INDEPENDENT_AMBULATORY_CARE_PROVIDER_SITE_OTHER): Payer: 59 | Admitting: Family Medicine

## 2021-07-13 VITALS — BP 140/98 | HR 70 | Ht 64.0 in

## 2021-07-13 DIAGNOSIS — U071 COVID-19: Secondary | ICD-10-CM | POA: Diagnosis not present

## 2021-07-13 NOTE — Progress Notes (Signed)
° ° ° °  [name] T. Chaden Doom, MD Primary Care and Sports Medicine [facility] at [facility] [place], [address] Phone: [phone]   FAX: [place] - 60 y.o. female   MRN [id number]   Date of Birth: 1961-04-29  Visit Date: 07/13/2021   PCP: Lesleigh Noe, MD   Referred by: Lesleigh Noe, MD  Virtual Visit via Telephone Note:  I connected with  Kristina Savage on 07/13/2021  9:20 AM EST by telephone and verified that I am speaking with the correct person using two identifiers.   Location patient: home phone or cell phone Location provider: work or home office Consent: Verbal consent directly obtained from Kristina Savage and that there may be a patient responsible charge related to this service. Persons participating in the virtual visit: patient, provider  I discussed the limitations of evaluation and management by telemedicine and the availability of in person appointments.  The patient expressed understanding and agreed to proceed.     History of Present Illness:  This is a nice patient who presents with acute COVID-19 that symptoms started on Wednesday.  She is coughing and has some upper airway and nasal congestion.  She is currently afebrile.  She does feel like she is getting better.  She does have a very significant cough.  NyQuil and Tessalon during the day is helping.  She is eating and drinking normally and she does not have any GI symptoms.  She is a smoker with very low volume right now, and she does have well-controlled diabetes.  Obesity and age are also risk factors.  She also has some cerebrovascular disease.    Lab Results  Component Value Date   HGBA1C 6.2 05/15/2020    Immunization History  Administered Date(s) Administered   Influenza, Seasonal, Injecte, Preservative Fre 05/20/2006   Influenza,inj,Quad PF,6+ Mos 05/15/2020   Influenza-Unspecified 05/01/2014, 05/14/2015, 05/15/2020, 05/06/2021   Moderna  Sars-Covid-2 Vaccination 11/21/2019, 12/19/2019, 07/15/2020   Tdap 05/15/2020   Zoster Recombinat (Shingrix) 03/10/2021     Review of Systems: pertinent positives and pertinent negatives as per HPI No acute distress verbally   Observations/Objective/Exam:  An attempt was made to discern vital signs over the phone and per patient if applicable and possible.   Neurological:     Mental Status: pleasant and appropriate   Psychiatric:        Thought Content: Thought content normal.      Assessment and Plan:    ICD-10-CM   1. COVID-19  U07.1      Total encounter time: 10 minutes. This includes total time spent on the day of encounter.   High risk patient, currently on Paxlovid and improving.  Continue supportive care, and I reviewed isolation guidelines.  I discussed the assessment and treatment plan with the patient. The patient was provided an opportunity to ask questions and all were answered. The patient agreed with the plan and demonstrated an understanding of the instructions.   The patient was advised to call back or seek an in-person evaluation if the symptoms worsen or if the condition fails to improve as anticipated.  Follow-up: prn unless noted otherwise below No follow-ups on file.  No orders of the defined types were placed in this encounter.  No orders of the defined types were placed in this encounter.   Signed,  Maud Deed. Lynkin Saini, MD

## 2021-07-16 ENCOUNTER — Ambulatory Visit: Payer: Self-pay | Admitting: Psychiatry

## 2021-08-11 ENCOUNTER — Ambulatory Visit: Payer: 59 | Admitting: Psychiatry

## 2021-08-11 ENCOUNTER — Ambulatory Visit (INDEPENDENT_AMBULATORY_CARE_PROVIDER_SITE_OTHER): Payer: Self-pay

## 2021-08-11 ENCOUNTER — Other Ambulatory Visit: Payer: Self-pay

## 2021-08-11 DIAGNOSIS — Z23 Encounter for immunization: Secondary | ICD-10-CM

## 2021-08-11 NOTE — Progress Notes (Signed)
Per orders of Tabitha Dugal in leu of Dr. Verda Cumins absence, injection of Zoster was given by Ophelia Shoulder. Patient tolerated injection well.

## 2021-09-01 ENCOUNTER — Encounter: Payer: Self-pay | Admitting: Psychiatry

## 2021-09-01 ENCOUNTER — Ambulatory Visit (INDEPENDENT_AMBULATORY_CARE_PROVIDER_SITE_OTHER): Payer: Self-pay | Admitting: Psychiatry

## 2021-09-01 ENCOUNTER — Other Ambulatory Visit: Payer: Self-pay

## 2021-09-01 VITALS — BP 130/85 | HR 78 | Temp 97.9°F | Wt 279.2 lb

## 2021-09-01 DIAGNOSIS — F331 Major depressive disorder, recurrent, moderate: Secondary | ICD-10-CM

## 2021-09-01 DIAGNOSIS — F4010 Social phobia, unspecified: Secondary | ICD-10-CM

## 2021-09-01 DIAGNOSIS — F411 Generalized anxiety disorder: Secondary | ICD-10-CM

## 2021-09-01 MED ORDER — ESCITALOPRAM OXALATE 10 MG PO TABS: 10.0000 mg | ORAL_TABLET | Freq: Every day | ORAL | 0 refills | Status: DC

## 2021-09-01 MED ORDER — PROPRANOLOL HCL 10 MG PO TABS: 10.0000 mg | ORAL_TABLET | Freq: Two times a day (BID) | ORAL | 1 refills | Status: DC | PRN

## 2021-09-01 NOTE — Patient Instructions (Signed)
Propranolol Tablets What is this medication? PROPRANOLOL (proe PRAN oh lole) treats many conditions such as high blood pressure, tremors, and a type of arrhythmia known as AFib (atrial fibrillation). It works by lowering your blood pressure and heart rate, making it easier for your heart to pump blood to the rest of your body. It may be used to prevent migraine headaches. It works by relaxing the blood vessels in the brain that cause migraines. It belongs to a group of medications called beta blockers. This medicine may be used for other purposes; ask your health care provider or pharmacist if you have questions. COMMON BRAND NAME(S): Inderal What should I tell my care team before I take this medication? They need to know if you have any of these conditions: Circulation problems or blood vessel disease Diabetes History of heart attack or heart disease, vasospastic angina Kidney disease Liver disease Lung or breathing disease, like asthma or emphysema Pheochromocytoma Slow heart rate Thyroid disease An unusual or allergic reaction to propranolol, other beta-blockers, medications, foods, dyes, or preservatives Pregnant or trying to get pregnant Breast-feeding How should I use this medication? Take this medication by mouth. Take it as directed on the prescription label at the same time every day. Keep taking it unless your care team tells you to stop. Talk to your care team about the use of this medication in children. Special care may be needed. Overdosage: If you think you have taken too much of this medicine contact a poison control center or emergency room at once. NOTE: This medicine is only for you. Do not share this medicine with others. What if I miss a dose? If you miss a dose, take it as soon as you can. If it is almost time for your next dose, take only that dose. Do not take double or extra doses. What may interact with this medication? Do not take this medication with any of the  following: Feverfew Phenothiazines like chlorpromazine, mesoridazine, prochlorperazine, thioridazine This medication may also interact with the following: Aluminum hydroxide gel Antipyrine Antiviral medications for HIV or AIDS Barbiturates like phenobarbital Certain medications for blood pressure, heart disease, irregular heart beat Cimetidine Ciprofloxacin Diazepam Fluconazole Haloperidol Isoniazid Medications for cholesterol like cholestyramine or colestipol Medications for mental depression Medications for migraine headache like almotriptan, eletriptan, frovatriptan, naratriptan, rizatriptan, sumatriptan, zolmitriptan NSAIDs, medications for pain and inflammation, like ibuprofen or naproxen Phenytoin Rifampin Teniposide Theophylline Thyroid medications Tolbutamide Warfarin Zileuton This list may not describe all possible interactions. Give your health care provider a list of all the medicines, herbs, non-prescription drugs, or dietary supplements you use. Also tell them if you smoke, drink alcohol, or use illegal drugs. Some items may interact with your medicine. What should I watch for while using this medication? Visit your care team for regular checks on your progress. Check your blood pressure as directed. Ask your care team what your blood pressure should be. Also, find out when you should contact him or her. Do not treat yourself for coughs, colds, or pain while you are using this medication without asking your care team for advice. Some medications may increase your blood pressure. You may get drowsy or dizzy. Do not drive, use machinery, or do anything that needs mental alertness until you know how this medication affects you. Do not stand up or sit up quickly, especially if you are an older patient. This reduces the risk of dizzy or fainting spells. Alcohol may interfere with the effect of this medication. Avoid alcoholic drinks.  This medication may increase blood sugar.  Ask your care team if changes in diet or medications are needed if you have diabetes. What side effects may I notice from receiving this medication? Side effects that you should report to your care team as soon as possible: Allergic reactions--skin rash, itching, hives, swelling of the face, lips, tongue, or throat Heart failure--shortness of breath, swelling of the ankles, feet, or hands, sudden weight gain, unusual weakness or fatigue Low blood pressure--dizziness, feeling faint or lightheaded, blurry vision Raynaud's--cool, numb, or painful fingers or toes that may change color from pale, to blue, to red Redness, blistering, peeling, or loosening of the skin, including inside the mouth Slow heartbeat--dizziness, feeling faint or lightheaded, confusion, trouble breathing, unusual weakness or fatigue Worsening mood, feelings of depression Side effects that usually do not require medical attention (report to your care team if they continue or are bothersome): Change in sex drive or performance Diarrhea Dizziness Fatigue Headache This list may not describe all possible side effects. Call your doctor for medical advice about side effects. You may report side effects to FDA at 1-800-FDA-1088. Where should I keep my medication? Keep out of the reach of children and pets. Store at room temperature between 20 and 25 degrees C (68 and 77 degrees F). Protect from light. Throw away any unused medication after the expiration date. NOTE: This sheet is a summary. It may not cover all possible information. If you have questions about this medicine, talk to your doctor, pharmacist, or health care provider.  2022 Elsevier/Gold Standard (2021-03-31 00:00:00)

## 2021-09-01 NOTE — Progress Notes (Signed)
Psychiatric Initial Adult Assessment   Patient Identification: Kristina Savage MRN:  [id number] Date of Evaluation:  09/01/2021 Referral Source: Dr.Hemang Manuella Ghazi Chief Complaint:   Chief Complaint   Establish Care.  61 year old Caucasian female with history of depression, concerns for parkinsonism, presents to establish care.    Visit Diagnosis: R/O functional neurological symptom disorder   ICD-10-CM   1. MDD (major depressive disorder), recurrent episode, moderate (HCC)  F33.1 escitalopram (LEXAPRO) 10 MG tablet    2. GAD (generalized anxiety disorder)  F41.1 escitalopram (LEXAPRO) 10 MG tablet    propranolol (INDERAL) 10 MG tablet    3. Social anxiety disorder  F40.10 escitalopram (LEXAPRO) 10 MG tablet    propranolol (INDERAL) 10 MG tablet      History of Present Illness:  Kristina Savage is a 61 year old Caucasian female married, unemployed, currently applying for disability, lives in [place] with her husband, has a history of depression, concerns for parkinsonism, prediabetic, hyperlipidemia, history of left cerebellar stroke, currently on aspirin plus statin therapy, history of hypovitaminosis, history of obstructive sleep apnea, presents to establish care.  Patient reports he has been struggling with depressive symptoms since the past several years.  She initially had depressive symptoms after the death of her mother in 2015-10-23.  She reports she had severe symptoms of sadness, crying spells, anhedonia, lack of motivation, low energy, sleep problems at that time.  Patient reports she could not function at her work and hence was fired.  Patient reports she started taking Prozac 1-1/2 years ago initiated by her primary care provider.  She however reports she currently feels the Prozac may not be that beneficial.  She currently struggles with sadness, anhedonia, low motivation, low energy, fatigue, sleep problems-restlessness at night and excessive sleepiness during the day.  Patient denies any  suicidality.  Denies any homicidality.  Denies any perceptual disturbances.  Patient does struggle with anxiety symptoms.  She reports she worries about different things all the time.  She is often restless, feels overwhelmed and on edge.  She reports her husband may have observed some irritability on and off.  She also tends to get extremely anxious in social situations since she has tremors.  She feels people are looking at her and scrutinizing her when she is in social situations.  This has been going on since the onset of her tremors.  She used to go out a lot, used to go shopping, to church and all in the past.  She however reports she tries to stay to herself and does not want to go out anymore because of her social anxiety which is exacerbated by her tremors.  Patient reports sleep problems.  She reports she had sleep study completed and was advised to start CPAP.  She however has been noncompliant.  She hence struggles with restlessness at night as well as excessive fatigue, tiredness during the day.  Patient denies any perceptual disturbances.  Denies any history of trauma.  Patient reports a history of tremors.  She reports this may have started prior to 23-Oct-2015.  Since the past couple of years her tremors have worsened.  Patient observed as having significant tremors of her upper extremities as well as possible tremors in session today.  Patient reports that it tends to get worse when she is in social situations.  She hence tends to avoid being in public places since she is embarrassed.  Patient reports she is currently on medication for Parkinson's disease however she was told it is possible  she may also have functional neurological symptom disorder.  She is currently under the care of neurology, undergoing work-up.  Patient also reports she was advised to start Lexapro at bedtime to help with her anxiety.  She reports she was prescribed 5 mg Lexapro however has been taking 10 mg and that has been  effective.  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  Patient on carbidopa/levodopa.  Previously ordered SynOne Test , not completed. Discussed DaTScan.'   Patient does report a remote history of abusing cannabis, cocaine and methamphetamine-this may have been 30 years ago.  She reports it was recreational and she no longer uses it.    Associated Signs/Symptoms: Depression Symptoms:  depressed mood, anhedonia, hypersomnia, fatigue, difficulty concentrating, anxiety, (Hypo) Manic Symptoms:   Denies Anxiety Symptoms:  Excessive Worry, Social Anxiety, Psychotic Symptoms:   Denies PTSD Symptoms: Negative  Past Psychiatric History: Patient denies inpatient mental health admissions.  Patient denies suicide attempts.  Patient was started on Prozac by her primary care provider few years ago.  Previous Psychotropic Medications: Yes Lexapro, Prozac  Substance Abuse History in the last 12 months:  No.  Consequences of Substance Abuse: Negative  Past Medical History:  Past Medical History:  Diagnosis Date   Anxiety    Chicken pox    Depression    Diverticulitis    Dizziness    Genital warts    GERD (gastroesophageal reflux disease)    Hay fever    Hyperlipidemia    Ovarian cyst    Pre-diabetes    Stroke [facility])    UTI (lower urinary tract infection)     Past Surgical History:  Procedure Laterality Date   CERVIX REMOVAL     Laser surgery, not complete removal   CESAREAN SECTION     CHONDROPLASTY Right 06/11/2020   Procedure: CHONDROPLASTY;  Surgeon: Netta Cedars, MD;  Location: [place];  Service: Orthopedics;  Laterality: Right;   KNEE ARTHROSCOPY WITH MEDIAL MENISECTOMY Right  06/11/2020   Procedure: KNEE ARTHROSCOPY WITH MEDIAL MENISECTOMY;  Surgeon: Netta Cedars, MD;  Location: [place];  Service: Orthopedics;  Laterality: Right;    Family Psychiatric History: As noted below.  Family History:  Family History  Problem Relation Age of Onset   Arthritis Mother    Hypertension Mother    COPD Mother    Heart failure Mother    Arthritis Father    Thyroid disease Father        thinks cancer   Parkinson's disease Father    Alcohol abuse Sister    Cancer Maternal Aunt        Female cancer - not sure what   Breast cancer Maternal Aunt    Prostate cancer Maternal Uncle    Colon cancer Maternal Uncle 45   Breast cancer Maternal Grandmother    Diabetes Paternal Grandmother    Parkinson's disease Paternal Grandmother    Diabetes Son     Social History:   Social History   Socioeconomic History   Marital status: Married    Spouse name: [name]   Number of children: 4   Years of education: High school   Highest education level: Not on file  Occupational History   Occupation: CSR  Tobacco Use   Smoking status: Every Day    Packs/day: 0.25  Years: 30.00    Pack years: 7.50    Types: Cigarettes    Passive exposure: Past   Smokeless tobacco: Never  Vaping Use   Vaping Use: Not on file  Substance and Sexual Activity   Alcohol use: Not Currently    Alcohol/week: 0.0 standard drinks    Comment: once a year   Drug use: No   Sexual activity: Yes    Birth control/protection: Post-menopausal  Other Topics Concern   Not on file  Social History Narrative   04/24/20   From: [place] - moved to [place] to raise her family   Living: with husband, [name] (1991)         Family: 7 children - including step children - twin boys nearby and daughter nearby - 10 grandchildren      Enjoys: spend time with family      Exercise: knee limiting exercise - tries to walk   Diet: eats whatever      Safety   Seat belts: Yes    Guns: Yes  and secure    Safe in relationships: Yes    Social Determinants of Health   Financial Resource Strain: Not on file  Food Insecurity: Not on file  Transportation Needs: Not on file  Physical Activity: Not on file  Stress: Not on file  Social Connections: Not on file    Additional Social History: Patient is married. Lives with her husband of 41 years in [place].  Patient graduated high school.  She used to work in Therapist, art previously.  She is currently applying for disability.  She has 4 biological children, 2 daughters as well as twin sons.  One of her daughters and twin sons live in [place].  One of her daughters live in [place].  She has a good relationship with her children.  Denies any history of trauma.  Denies any history of legal problems.  Denies being in the [employer]  Allergies:   Allergies  Allergen Reactions   Oxycodone-Acetaminophen Nausea And Vomiting   Percocet [Oxycodone-Acetaminophen] Nausea And Vomiting   Pollen Extract     Nasal congestion    Metabolic Disorder Labs: Lab Results  Component Value Date   HGBA1C 6.2 05/15/2020   No results found for: PROLACTIN Lab Results  Component Value Date   CHOL 154 03/10/2021   TRIG 82.0 03/10/2021   HDL 63.90 03/10/2021   CHOLHDL 2 03/10/2021   VLDL 16.4 03/10/2021   LDLCALC 73 03/10/2021   LDLCALC 118 (H) 05/15/2020   Lab Results  Component Value Date   TSH 2.11 05/15/2020    Therapeutic Level Labs: No results found for: LITHIUM No results found for: CBMZ No results found for: VALPROATE  Current Medications: Current Outpatient Medications  Medication Sig Dispense Refill   aspirin EC 81 MG tablet Take 81 mg by mouth daily. Swallow whole.     atorvastatin (LIPITOR) 10 MG tablet TAKE 1 TABLET BY MOUTH EVERY DAY 90 tablet 3   carbidopa-levodopa (SINEMET IR) 25-100 MG tablet Take 1 tablet by mouth 3 (three) times daily.     escitalopram (LEXAPRO) 10 MG tablet Take 10 mg by mouth daily.      escitalopram (LEXAPRO) 10 MG tablet Take 1 tablet (10 mg total) by mouth daily. 30 tablet 0   metFORMIN (GLUCOPHAGE) 500 MG tablet Take 1 tablet by mouth 2 (two) times daily.     propranolol (INDERAL) 10 MG tablet Take 1 tablet (10 mg total) by mouth 2 (two) times daily  as needed. For severe anxiety 60 tablet 1   ergocalciferol (VITAMIN D2) 1.25 MG (50000 UT) capsule Take 1 capsule by mouth once a week. (Patient not taking: Reported on 09/01/2021)     ibuprofen (ADVIL) 600 MG tablet Take 1 tablet (600 mg total) by mouth every 6 (six) hours as needed. (Patient not taking: Reported on 09/01/2021) 30 tablet 0   Vitamin D, Ergocalciferol, (DRISDOL) 1.25 MG (50000 UNIT) CAPS capsule Take 1 capsule by mouth once a week. (Patient not taking: Reported on 09/01/2021)     No current facility-administered medications for this visit.    Musculoskeletal: Strength & Muscle Tone: within normal limits Gait & Station: normal Patient leans: N/A  Psychiatric Specialty Exam: Review of Systems  Constitutional:  Positive for fatigue.  Neurological:  Positive for tremors.  Psychiatric/Behavioral:  Positive for decreased concentration, dysphoric mood and sleep disturbance. The patient is nervous/anxious.   All other systems reviewed and are negative.  Blood pressure 130/85, pulse 78, temperature 97.9 F (36.6 C), temperature source Temporal, weight 279 lb 3.2 oz (126.6 kg).Body mass index is 47.92 kg/m.  General Appearance: Casual  Eye Contact:  Fair  Speech:  Normal Rate  Volume:  Normal  Mood:  Anxious and Depressed  Affect:  Congruent  Thought Process:  Goal Directed and Descriptions of Associations: Intact  Orientation:  Full (Time, Place, and Person)  Thought Content:  Logical  Suicidal Thoughts:  No  Homicidal Thoughts:  No  Memory:  Immediate;   Fair Recent;   Fair Remote;   limited - also reports short term memory loss  Judgement:  Fair  Insight:  Fair  Psychomotor Activity:  Increased,  Restlessness, and Tremor  Concentration:  Concentration: Fair and Attention Span: Fair  Recall:  [employer] of Knowledge:Fair  Language: Fair  Akathisia:  No  Handed:  Right  AIMS (if indicated):  done  Assets:  Communication Skills Desire for Improvement Housing Social Support  ADL's:  Intact  Cognition: WNL  Sleep:   excessive at times    Screenings: [place] Office Visit from 09/01/2021 in [place] Total Score [place] from 09/01/2021 in [place]  Total GAD-7 Score 11      PHQ2-9    [place] Visit from 09/01/2021 in [place] Visit from 12/29/2020 in [place] [facility] Visit from 05/15/2020 in [place] at [place]  PHQ-2 Total Score 4 1 2   PHQ-9 Total Score 19 4 10       [place] Office Visit from 09/01/2021 in [facility] from 01/16/2021 in [facility] [place] No Risk Error: Question 6 not populated       Assessment and Plan: ALIHA DIEDRICH is a 61 year old Caucasian female with history of depression, concern for Parkinson's disease versus functional neurological symptom disorder, prediabetic, hyperlipidemia, history of left cerebellar stroke, was evaluated in office today, presented to establish care.  Patient will benefit from the following plan.  The patient demonstrates the following risk factors for suicide: Chronic risk factors for suicide include: psychiatric disorder of depression, anxiety and medical illness parkinsons disease . Acute risk factors for suicide include: unemployment. Protective factors for this patient include: positive social support, positive therapeutic relationship, and coping skills. Considering these factors, the overall suicide risk at  this point appears to be low.  Patient is appropriate for outpatient follow up.  Plan  MDD-unstable Taper off Prozac.  Advised to take Prozac 20 mg p.o. daily in the morning.  She is currently on 60 mg daily. Continue Lexapro at 10 mg p.o. daily in the evening. Patient has been referred for CBT by her neurologist-encouraged to keep her appointment.  GAD-unstable Lexapro 10 mg p.o. daily in the evening We will consider increasing the dosage. Start propranolol 10 mg p.o. twice daily as needed for anxiety attacks  Social anxiety disorder-unstable Patient has been referred for CBT Start propranolol 10 mg p.o. twice daily as needed for anxiety attacks  I have reviewed notes per neurology-dated 07/28/2021-Kristina Savage as noted above.  I have reviewed and discussed labs-vitamin B12-within normal limits-dated 04/20/2021.  TSH-12/17/2020-within normal limits.  Patient advised to sign a release to obtain medical records from her therapist.  Follow-up in clinic in 2 weeks or sooner if needed.  This note was generated in part or whole with voice recognition software. Voice recognition is usually quite accurate but there are transcription errors that can and very often do occur. I apologize for any typographical errors that were not detected and corrected.       Kristina Alert, MD 2/8/202312:53 PM

## 2021-09-02 ENCOUNTER — Telehealth: Payer: Self-pay | Admitting: Neurology

## 2021-09-02 DIAGNOSIS — G4733 Obstructive sleep apnea (adult) (pediatric): Secondary | ICD-10-CM

## 2021-09-02 NOTE — Addendum Note (Signed)
Addended by: Star Age on: 09/02/2021 04:28 PM   Modules accepted: Orders

## 2021-09-02 NOTE — Telephone Encounter (Signed)
LMVM for pt to return call.  Have reached out to [employer] to see what can be found out from there end.

## 2021-09-02 NOTE — Telephone Encounter (Addendum)
Spoke to pt.  She states she never heard from [employer].  I relayed that we have reached  out to them.  Will also speak to Dr. Rexene Alberts and ask what is next.  I relayed that due to time gap may require another sleep study but will check on this.  I will let her know.  Pt did reach out to [place] in March 2022 that she had not been intouch and was message resent to [employer].  I will let her know.  Had Sleep study done 08-27-2021

## 2021-09-02 NOTE — Telephone Encounter (Signed)
New order placed for autoPAP.

## 2021-09-02 NOTE — Telephone Encounter (Signed)
Pt is asking for a call to discuss wanting to still get CPAP, she never received one.  Pt has declined scheduling a routine annual f/u at this time, she is asking for a call to discuss startting a CPAP 1st

## 2021-09-02 NOTE — Telephone Encounter (Addendum)
I called [employer] spoke person in supplies but she was able to assist with call. They had reached out to pt and relayed that there was a shortage, but would call pt.  She did not hear from them.   She stated that pt will need new order and recent visit notes.  No sleep study needs to be repeated.  Have pt call [phone] mainline and they will get her to dept.

## 2021-09-02 NOTE — Telephone Encounter (Addendum)
Unsure what happened. I have reached out to [employer] to inquire. However since its been over a year since she was seen, I'm afraid she would still need to schedule and be seen for insurance requirements so we can update her clinical notes.

## 2021-09-07 NOTE — Telephone Encounter (Signed)
Received this message from [employer] coordinator:  "Hey! Looks like at the time she had [employer] health insurance. That health insurance went out of business and we were no longer in network with that insurance. We let her know this at the time back last Feb"    I called the pt & LVM (ok per DPR) asking for a call back to let us know if the pt is ok with [place] sending a new CPAP order over to a different company here in [place] called [facility]. Left office number in message for pt to call us back letting us know if this is ok.

## 2021-09-07 NOTE — Telephone Encounter (Signed)
Order, office note, home sleep test result, insurance and demographics info faxed to [employer]. Received a receipt of confirmation.

## 2021-09-07 NOTE — Telephone Encounter (Signed)
Pt has not read my chart message. I have called the pt and left a vm this morning asking for a call back to discuss. If pt is agreeable we will send order to [facility].

## 2021-09-07 NOTE — Telephone Encounter (Signed)
Pt has called with a response to [place], RN's message.  Pt is ok with going with [facility]

## 2021-09-17 ENCOUNTER — Other Ambulatory Visit: Payer: Self-pay | Admitting: Family Medicine

## 2021-09-17 DIAGNOSIS — F321 Major depressive disorder, single episode, moderate: Secondary | ICD-10-CM

## 2021-09-23 ENCOUNTER — Encounter: Payer: Self-pay | Admitting: Psychiatry

## 2021-09-23 ENCOUNTER — Other Ambulatory Visit: Payer: Self-pay | Admitting: Psychiatry

## 2021-09-23 ENCOUNTER — Ambulatory Visit (INDEPENDENT_AMBULATORY_CARE_PROVIDER_SITE_OTHER): Payer: 59 | Admitting: Psychiatry

## 2021-09-23 ENCOUNTER — Other Ambulatory Visit: Payer: Self-pay

## 2021-09-23 VITALS — BP 114/75 | HR 80 | Temp 98.9°F | Wt 283.8 lb

## 2021-09-23 DIAGNOSIS — F331 Major depressive disorder, recurrent, moderate: Secondary | ICD-10-CM

## 2021-09-23 DIAGNOSIS — F4010 Social phobia, unspecified: Secondary | ICD-10-CM | POA: Diagnosis not present

## 2021-09-23 DIAGNOSIS — F411 Generalized anxiety disorder: Secondary | ICD-10-CM

## 2021-09-23 MED ORDER — ESCITALOPRAM OXALATE 20 MG PO TABS: 20.0000 mg | ORAL_TABLET | Freq: Every day | ORAL | 0 refills | Status: DC

## 2021-09-23 MED ORDER — CLONAZEPAM 0.5 MG PO TABS: 0.2500 mg | ORAL_TABLET | ORAL | 0 refills | Status: DC

## 2021-09-23 NOTE — Progress Notes (Signed)
[facility] MD OP Progress Note  09/23/2021 3:06 PM Kristina Savage  MRN:  [id number]  Chief Complaint:  Chief Complaint  Patient presents with   Follow-up: 61 year old Caucasian female with history of depression, anxiety, parkinsonian symptoms, presented for follow-up medication management.   HPI: Kristina Savage is a 61 year old Caucasian female, married, unemployed, currently applying for disability, lives in [place] with her husband, has a history of MDD, GAD, social anxiety disorder, parkinsonism, prediabetes, hyperlipidemia, history of left cerebellar stroke, currently on aspirin plus statin therapy, history of hypovitaminosis, history of obstructive sleep apnea on CPAP, presented for medication management follow-up.  Patient today reports she is having a hard time getting off of the Prozac.  The last week she was easily irritable and agitated.  Patient however reports her husband has observed her anxiety as improved.  She however reports she does not feel that way inside.  Patient is currently on the Lexapro.  She does not believe the current dosage is beneficial.  Patient is interested in increasing the dosage of Lexapro.  Denies side effects to Lexapro.  Currently on propranolol, reports the propranolol may have helped with her tremors to some extent.  However continues to have significant tremors of her upper extremities, currently under the care of neurology.  Currently takes the propranolol orally once a day, agrees to take it twice a day to see if that will be more beneficial.  Patient reports she was able to get her new CPAP, likely causing some side effects, agrees to discuss with primary care.  Patient denies any sleep problems at this time.  Patient denies any suicidality, homicidality or perceptual disturbances.  Reports she has upcoming appointment with therapist with you [facility], Ms. Dimas [place] tomorrow.  Motivated to keep it.  Denies any other concerns  today.    Visit Diagnosis:    ICD-10-CM   1. MDD (major depressive disorder), recurrent episode, moderate (HCC)  F33.1 escitalopram (LEXAPRO) 20 MG tablet    clonazePAM (KLONOPIN) 0.5 MG tablet    2. GAD (generalized anxiety disorder)  F41.1 escitalopram (LEXAPRO) 20 MG tablet    clonazePAM (KLONOPIN) 0.5 MG tablet    3. Social anxiety disorder  F40.10       Past Psychiatric History: Reviewed past psychiatric history from progress note on 09/01/2021.  Past trials of Lexapro, Prozac.  Past Medical History:  Past Medical History:  Diagnosis Date   Anxiety    Chicken pox    Depression    Diverticulitis    Dizziness    Genital warts    GERD (gastroesophageal reflux disease)    Hay fever    Hyperlipidemia    Ovarian cyst    Pre-diabetes    Stroke [facility])    UTI (lower urinary tract infection)     Past Surgical History:  Procedure Laterality Date   CERVIX REMOVAL     Laser surgery, not complete removal   CESAREAN SECTION     CHONDROPLASTY Right 06/11/2020   Procedure: CHONDROPLASTY;  Surgeon: Netta Cedars, MD;  Location: [facility];  Service: Orthopedics;  Laterality: Right;   KNEE ARTHROSCOPY WITH MEDIAL MENISECTOMY Right 06/11/2020   Procedure: KNEE ARTHROSCOPY WITH MEDIAL MENISECTOMY;  Surgeon: Netta Cedars, MD;  Location: [facility];  Service: Orthopedics;  Laterality: Right;    Family Psychiatric History: Reviewed family psychiatric history from progress note on 09/01/2021.  Family History:  Family History  Problem Relation Age of Onset   Arthritis Mother  Hypertension Mother    COPD Mother    Heart failure Mother    Arthritis Father    Thyroid disease Father        thinks cancer   Parkinson's disease Father    Alcohol abuse Sister    Cancer Maternal Aunt        Female cancer - not sure what   Breast cancer Maternal Aunt    Prostate cancer Maternal Uncle    Colon cancer Maternal Uncle 45   Breast cancer Maternal Grandmother     Diabetes Paternal Grandmother    Parkinson's disease Paternal Grandmother    Diabetes Son     Social History: Reviewed social history from progress note on 09/01/2021. Social History   Socioeconomic History   Marital status: Married    Spouse name: [name]   Number of children: 4   Years of education: High school   Highest education level: Not on file  Occupational History   Occupation: CSR  Tobacco Use   Smoking status: Every Day    Packs/day: 0.25    Years: 30.00    Pack years: 7.50    Types: Cigarettes    Passive exposure: Past   Smokeless tobacco: Never  Vaping Use   Vaping Use: Not on file  Substance and Sexual Activity   Alcohol use: Not Currently    Alcohol/week: 0.0 standard drinks    Comment: once a year   Drug use: No   Sexual activity: Yes    Birth control/protection: Post-menopausal  Other Topics Concern   Not on file  Social History Narrative   04/24/20   From: [place] - moved to [place] to raise her family   Living: with husband, [name] (1991)         Family: 7 children - including step children - twin boys nearby and daughter nearby - 10 grandchildren      Enjoys: spend time with family      Exercise: knee limiting exercise - tries to walk   Diet: eats whatever      Safety   Seat belts: Yes    Guns: Yes  and secure   Safe in relationships: Yes    Social Determinants of Health   Financial Resource Strain: Not on file  Food Insecurity: Not on file  Transportation Needs: Not on file  Physical Activity: Not on file  Stress: Not on file  Social Connections: Not on file    Allergies:  Allergies  Allergen Reactions   Oxycodone-Acetaminophen Nausea And Vomiting   Percocet [Oxycodone-Acetaminophen] Nausea And Vomiting   Pollen Extract     Nasal congestion    Metabolic Disorder Labs: Lab Results  Component Value Date   HGBA1C 6.2 05/15/2020   No results found for: PROLACTIN Lab Results  Component Value Date   CHOL 154 03/10/2021   TRIG  82.0 03/10/2021   HDL 63.90 03/10/2021   CHOLHDL 2 03/10/2021   VLDL 16.4 03/10/2021   LDLCALC 73 03/10/2021   LDLCALC 118 (H) 05/15/2020   Lab Results  Component Value Date   TSH 2.11 05/15/2020   TSH 1.26 08/07/2013    Therapeutic Level Labs: No results found for: LITHIUM No results found for: VALPROATE No components found for:  CBMZ  Current Medications: Current Outpatient Medications  Medication Sig Dispense Refill   aspirin EC 81 MG tablet Take 81 mg by mouth daily. Swallow whole.     atorvastatin (LIPITOR) 10 MG tablet TAKE 1 TABLET BY MOUTH EVERY DAY 90 tablet  3   carbidopa-levodopa (SINEMET IR) 25-100 MG tablet Take 1 tablet by mouth 3 (three) times daily.     clonazePAM (KLONOPIN) 0.5 MG tablet Take 0.5-1 tablets (0.25-0.5 mg total) by mouth as directed. Take half to 1 tablet 1-2 times a week for severe anxiety, agitation 7 tablet 0   ergocalciferol (VITAMIN D2) 1.25 MG (50000 UT) capsule Take 1 capsule by mouth once a week.     escitalopram (LEXAPRO) 20 MG tablet Take 1 tablet (20 mg total) by mouth daily. 30 tablet 0   ibuprofen (ADVIL) 600 MG tablet Take 1 tablet (600 mg total) by mouth every 6 (six) hours as needed. 30 tablet 0   metFORMIN (GLUCOPHAGE) 500 MG tablet Take 1 tablet by mouth 2 (two) times daily.     propranolol (INDERAL) 10 MG tablet Take 1 tablet (10 mg total) by mouth 2 (two) times daily as needed. For severe anxiety 60 tablet 1   Vitamin D, Ergocalciferol, (DRISDOL) 1.25 MG (50000 UNIT) CAPS capsule Take 1 capsule by mouth once a week.     No current facility-administered medications for this visit.     Musculoskeletal: Strength & Muscle Tone: within normal limits Gait & Station: normal Patient leans: N/A  Psychiatric Specialty Exam: Review of Systems  Neurological:  Positive for tremors.  Psychiatric/Behavioral:  Positive for dysphoric mood. The patient is nervous/anxious.   All other systems reviewed and are negative.  Blood pressure  114/75, pulse 80, temperature 98.9 F (37.2 C), temperature source Temporal, weight 283 lb 12.8 oz (128.7 kg).Body mass index is 48.71 kg/m.  General Appearance: Casual  Eye Contact:  Fair  Speech:  Clear and Coherent  Volume:  Normal  Mood:  Anxious and Depressed  Affect:  Congruent  Thought Process:  Goal Directed and Descriptions of Associations: Intact  Orientation:  Full (Time, Place, and Person)  Thought Content: Logical   Suicidal Thoughts:  No  Homicidal Thoughts:  No  Memory:  Immediate;   Fair Recent;   Fair Remote;   Fair  Judgement:  Fair  Insight:  Fair  Psychomotor Activity:  Increased and Tremor  Concentration:  Concentration: Fair and Attention Span: Fair  Recall:  [employer] of Knowledge: Fair  Language: Fair  Akathisia:  No  Handed:  Right  AIMS (if indicated): done  Assets:  Communication Skills Desire for Improvement Housing Intimacy Social Support  ADL's:  Intact  Cognition: WNL  Sleep:  Fair   Screenings: [place] Office Visit from 09/01/2021 in [facility] Total Score 14      GAD-7    [place] Office Visit from 09/23/2021 in [facility] Office Visit from 09/01/2021 in [facility]  Total GAD-7 Score 16 11      PHQ2-9    [place] Visit from 09/23/2021 in [facility] Office Visit from 09/01/2021 in [facility] [place] Visit from 12/29/2020 in [place] at [facility] Visit from 05/15/2020 in [place] at [place]  PHQ-2 Total Score 5 4 1 2   PHQ-9 Total Score 11 19 4 10       [place] Office Visit from 09/23/2021 in [facility] Office Visit from 09/01/2021 in [place] ED from 01/16/2021 in [facility] at [place] No Risk No Risk Error: Question 6 not  populated  Assessment and Plan: Kristina Savage is a 61 year old Caucasian female with history of MDD, GAD, social anxiety disorder, concerns for Parkinson's disease versus functional neurological symptom disorder, prediabetic, hyperlipidemia, history of left cerebellar stroke, was evaluated in office today.  Patient with worsening anxiety symptoms, possible withdrawal symptoms from being tapered off of the Prozac, will benefit from medication readjustment and psychotherapy sessions.  Plan as noted below.  Plan MDD-unstable Increase Lexapro to 20 mg p.o. daily. Continue to taper off Prozac. AIMS - 14   GAD-unstable Increase Lexapro to 20 mg p.o. daily Propranolol 10 mg p.o. twice daily as needed for anxiety attacks Start Klonopin 0.25-0.5 mg as needed for severe agitation or anxiety.  Provided education, advised to limit use.  Reviewed [place] PMP aware Patient has upcoming appointment with therapist at [facility]. Belenda Cruise [place]-tomorrow  Social anxiety disorder-unstable Patient to start psychotherapy sessions Lexapro was prescribed  Follow-up in clinic in 2 to 3 weeks or sooner in person.   Collaboration of Care: Collaboration of Care: Referral or follow-up with counselor/therapist AEB patient to start psychotherapy sessions, encouraged to do so.  Has upcoming appointment tomorrow morning. This note was generated in part or whole with voice recognition software. Voice recognition is usually quite accurate but there are transcription errors that can and very often do occur. I apologize for any typographical errors that were not detected and corrected.  Patient/Guardian was advised Release of Information must be obtained prior to any record release in order to collaborate their care with an outside provider. Patient/Guardian was advised if they have not already done so to contact the registration department to sign all necessary forms in order for [place] to release information regarding  their care.   Consent: Patient/Guardian gives verbal consent for treatment and assignment of benefits for services provided during this visit. Patient/Guardian expressed understanding and agreed to proceed.   This note was generated in part or whole with voice recognition software. Voice recognition is usually quite accurate but there are transcription errors that can and very often do occur. I apologize for any typographical errors that were not detected and corrected.      Ursula Alert, MD 09/23/2021, 3:06 PM

## 2021-09-23 NOTE — Patient Instructions (Signed)
Clonazepam Tablets ?What is this medication? ?CLONAZEPAM (kloe NA ze pam) treats seizures. It may also be used to treat panic disorder. It works by Child psychotherapist system calm down. It belongs to a group of medications called benzodiazepines. ?This medicine may be used for other purposes; ask your health care provider or pharmacist if you have questions. ?COMMON BRAND NAME(S): Ceberclon, Klonopin ?What should I tell my care team before I take this medication? ?They need to know if you have any of these conditions: ?An alcohol or drug abuse problem ?Bipolar disorder, depression, psychosis or other mental health condition ?Glaucoma ?Kidney or liver disease ?Lung or breathing disease ?Myasthenia gravis ?Parkinson disease ?Porphyria ?Seizures or a history of seizures ?Suicidal thoughts ?An unusual or allergic reaction to clonazepam, other benzodiazepines, foods, dyes, or preservatives ?Pregnant or trying to get pregnant ?Breast-feeding ?How should I use this medication? ?Take this medication by mouth with a glass of water. Follow the directions on the prescription label. If it upsets your stomach, take it with food or milk. Take your medication at regular intervals. Do not take it more often than directed. Do not stop taking or change the dose except on the advice of your care team. ?A special MedGuide will be given to you by the pharmacist with each prescription and refill. Be sure to read this information carefully each time. ?Talk to your care team regarding the use of this medication in children. Special care may be needed. ?Overdosage: If you think you have taken too much of this medicine contact a poison control center or emergency room at once. ?NOTE: This medicine is only for you. Do not share this medicine with others. ?What if I miss a dose? ?If you miss a dose, take it as soon as you can. If it is almost time for your next dose, take only that dose. Do not take double or extra doses. ?What may interact  with this medication? ?Do not take this medication with any of the following: ?Narcotic medications for cough ?Sodium oxybate ?This medication may also interact with the following: ?Alcohol ?Antihistamines for allergy, cough and cold ?Antiviral medications for HIV or AIDS ?Certain medications for anxiety or sleep ?Certain medications for depression, like amitriptyline, fluoxetine, sertraline ?Certain medications for fungal infections like ketoconazole and itraconazole ?Certain medications for seizures like carbamazepine, phenobarbital, phenytoin, primidone ?General anesthetics like halothane, isoflurane, methoxyflurane, propofol ?Local anesthetics like lidocaine, pramoxine, tetracaine ?Medications that relax muscles for surgery ?Narcotic medications for pain ?Phenothiazines like chlorpromazine, mesoridazine, prochlorperazine, thioridazine ?This list may not describe all possible interactions. Give your health care provider a list of all the medicines, herbs, non-prescription drugs, or dietary supplements you use. Also tell them if you smoke, drink alcohol, or use illegal drugs. Some items may interact with your medicine. ?What should I watch for while using this medication? ?Tell your care team if your symptoms do not start to get better or if they get worse. ?Do not stop taking except on your care team's advice. You may develop a severe reaction. Your care team will tell you how much medication to take. ?You may get drowsy or dizzy. Do not drive, use machinery, or do anything that needs mental alertness until you know how this medication affects you. To reduce the risk of dizzy and fainting spells, do not stand or sit up quickly, especially if you are an older patient. Alcohol may increase dizziness and drowsiness. Avoid alcoholic drinks. ?If you are taking another medication that also causes drowsiness, you may  have more side effects. Give your care team a list of all medications you use. Your care team will tell  you how much medication to take. Do not take more medication than directed. Call emergency services if you have problems breathing or unusual sleepiness. ?The use of this medication may increase the chance of suicidal thoughts or actions. Pay special attention to how you are responding while on this medication. Any worsening of mood, or thoughts of suicide or dying should be reported to your care team right away. ?What side effects may I notice from receiving this medication? ?Side effects that you should report to your care team as soon as possible: ?Allergic reactions--skin rash, itching, hives, swelling of the face, lips, tongue, or throat ?CNS depression--slow or shallow breathing, shortness of breath, feeling faint, dizziness, confusion, trouble staying awake ?Thoughts of suicide or self-harm, worsening mood, feelings of depression ?Side effects that usually do not require medical attention (report to your care team if they continue or are bothersome): ?Dizziness ?Drowsiness ?Headache ?This list may not describe all possible side effects. Call your doctor for medical advice about side effects. You may report side effects to FDA at 1-800-FDA-1088. ?Where should I keep my medication? ?Keep out of the reach of children and pets. This medication can be abused. Keep your medication in a safe place to protect it from theft. Do not share this medication with anyone. Selling or giving away this medication is dangerous and against the law. ?Store at room temperature between 15 and 30 degrees C (59 and 86 degrees F). Protect from light. Keep container tightly closed. ?This medication may cause accidental overdose and death if taken by other adults, children, or pets. Mix any unused medication with a substance like cat litter or coffee grounds. Then throw the medication away in a sealed container like a sealed bag or a coffee can with a lid. Do not use the medication after the expiration date. ?NOTE: This sheet is a  summary. It may not cover all possible information. If you have questions about this medicine, talk to your doctor, pharmacist, or health care provider. ?? 2022 Elsevier/Gold Standard (2020-08-08 00:00:00) ? ?

## 2021-10-05 ENCOUNTER — Telehealth: Payer: Self-pay

## 2021-10-05 NOTE — Telephone Encounter (Signed)
pt states she wants to stop the lexapro.  she states it makes her dizzy.  ?

## 2021-10-05 NOTE — Telephone Encounter (Signed)
Returned call to patient.  She reports she is currently taking 1/2 tablet of the Lexapro 20 mg since the higher dose made her dizzy. ? ?Patient reports dizziness better on the half tablet. ? ?Agrees to stay on the half tablet for now and give it more time. ? ?She does have upcoming appointment coming up and will discuss further medication changes at that visit. ?

## 2021-10-15 ENCOUNTER — Ambulatory Visit: Payer: 59 | Admitting: Psychiatry

## 2021-10-17 ENCOUNTER — Other Ambulatory Visit: Payer: Self-pay | Admitting: Psychiatry

## 2021-10-17 DIAGNOSIS — F411 Generalized anxiety disorder: Secondary | ICD-10-CM

## 2021-10-17 DIAGNOSIS — F331 Major depressive disorder, recurrent, moderate: Secondary | ICD-10-CM

## 2021-10-17 DIAGNOSIS — F4010 Social phobia, unspecified: Secondary | ICD-10-CM

## 2021-10-29 ENCOUNTER — Emergency Department (HOSPITAL_COMMUNITY): Payer: Commercial Managed Care - HMO

## 2021-10-29 ENCOUNTER — Telehealth: Payer: Self-pay

## 2021-10-29 ENCOUNTER — Other Ambulatory Visit: Payer: Self-pay

## 2021-10-29 ENCOUNTER — Encounter (HOSPITAL_COMMUNITY): Payer: Self-pay

## 2021-10-29 ENCOUNTER — Emergency Department (HOSPITAL_COMMUNITY)
Admission: EM | Admit: 2021-10-29 | Discharge: 2021-10-29 | Disposition: A | Discharge status: Home / Self Care | Payer: Commercial Managed Care - HMO | Attending: Emergency Medicine | Admitting: Emergency Medicine

## 2021-10-29 DIAGNOSIS — G20 Parkinson's disease: Secondary | ICD-10-CM | POA: Diagnosis not present

## 2021-10-29 DIAGNOSIS — Z8669 Personal history of other diseases of the nervous system and sense organs: Secondary | ICD-10-CM | POA: Diagnosis not present

## 2021-10-29 DIAGNOSIS — R4701 Aphasia: Secondary | ICD-10-CM | POA: Insufficient documentation

## 2021-10-29 DIAGNOSIS — R519 Headache, unspecified: Secondary | ICD-10-CM | POA: Insufficient documentation

## 2021-10-29 DIAGNOSIS — Z7982 Long term (current) use of aspirin: Secondary | ICD-10-CM | POA: Insufficient documentation

## 2021-10-29 DIAGNOSIS — M542 Cervicalgia: Secondary | ICD-10-CM | POA: Insufficient documentation

## 2021-10-29 DIAGNOSIS — R42 Dizziness and giddiness: Secondary | ICD-10-CM | POA: Diagnosis not present

## 2021-10-29 LAB — CBC WITH DIFFERENTIAL/PLATELET
Abs Immature Granulocytes: 0.01 10*3/uL (ref 0.00–0.07)
Basophils Absolute: 0 10*3/uL (ref 0.0–0.1)
Basophils Relative: 1 %
Eosinophils Absolute: 0.2 10*3/uL (ref 0.0–0.5)
Eosinophils Relative: 5 %
HCT: 41.7 % (ref 36.0–46.0)
Hemoglobin: 12.9 g/dL (ref 12.0–15.0)
Immature Granulocytes: 0 %
Lymphocytes Relative: 27 %
Lymphs Abs: 1.3 10*3/uL (ref 0.7–4.0)
MCH: 27.8 pg (ref 26.0–34.0)
MCHC: 30.9 g/dL (ref 30.0–36.0)
MCV: 89.9 fL (ref 80.0–100.0)
Monocytes Absolute: 0.4 10*3/uL (ref 0.1–1.0)
Monocytes Relative: 8 %
Neutro Abs: 2.9 10*3/uL (ref 1.7–7.7)
Neutrophils Relative %: 59 %
Platelets: 211 10*3/uL (ref 150–400)
RBC: 4.64 MIL/uL (ref 3.87–5.11)
RDW: 14.5 % (ref 11.5–15.5)
WBC: 4.9 10*3/uL (ref 4.0–10.5)
nRBC: 0 % (ref 0.0–0.2)

## 2021-10-29 LAB — ETHANOL: Alcohol, Ethyl (B): <10 mg/dL (ref <10)

## 2021-10-29 LAB — PROTIME-INR
INR: 1 (ref 0.8–1.2)
Prothrombin Time: 13.3 seconds (ref 11.4–15.2)

## 2021-10-29 LAB — COMPREHENSIVE METABOLIC PANEL
ALT: 6 U/L (ref 0–44)
AST: 19 U/L (ref 15–41)
Albumin: 3.9 g/dL (ref 3.5–5.0)
Alkaline Phosphatase: 79 U/L (ref 38–126)
Anion gap: 7 (ref 5–15)
BUN: 14 mg/dL (ref 6–20)
CO2: 25 mmol/L (ref 22–32)
Calcium: 9.1 mg/dL (ref 8.9–10.3)
Chloride: 107 mmol/L (ref 98–111)
Creatinine, Ser: 0.65 mg/dL (ref 0.44–1.00)
GFR, Estimated: >60 mL/min (ref >60)
Glucose, Bld: 100 mg/dL — ABNORMAL HIGH (ref 70–99)
Potassium: 4 mmol/L (ref 3.5–5.1)
Sodium: 139 mmol/L (ref 135–145)
Total Bilirubin: 0.9 mg/dL (ref 0.3–1.2)
Total Protein: 6.6 g/dL (ref 6.5–8.1)

## 2021-10-29 LAB — APTT: aPTT: 33 seconds (ref 24–36)

## 2021-10-29 MED ORDER — METOCLOPRAMIDE HCL 5 MG/ML IJ SOLN: 5.0000 mg | Freq: Once | INTRAMUSCULAR | Status: DC

## 2021-10-29 MED ORDER — DIPHENHYDRAMINE HCL 50 MG/ML IJ SOLN: 25.0000 mg | Freq: Once | INTRAMUSCULAR | Status: DC

## 2021-10-29 NOTE — Discharge Instructions (Signed)
There was no signs of stroke on your MRI.  Follow-up with your primary doctor for reassessment in the next week.  Return for new or worsening signs or symptoms.  Continue take your normal medications at home. ?

## 2021-10-29 NOTE — ED Notes (Signed)
Pt off unit for testing

## 2021-10-29 NOTE — Telephone Encounter (Signed)
[facility] - Client ?TELEPHONE ADVICE RECORD ?AccessNurse? ?Patient ?Name: ?Kristina Savage ?LE ?Gender: Female ?DOB: Dec 16, 1960 ?Age: 61 Y 90 D ?Return ?Phone ?Number: ?[id number] ?(Primary), ?[id number] ?(Secondary) ?Address: ?City/ ?State/ ?Zip: [place] ? [address] ?Client [facility] - Client ?Client Site [facility] [place] ?Provider [name]- MD ?Contact Type Call ?Who Is Calling Patient / Member / Family / Caregiver ?Call Type Triage / Clinical ?Relationship To Patient Self ?Return Phone Number [phone] (Primary) ?Chief Complaint SPEAK - sudden inability to talk or slurred speech ?Reason for Call Symptomatic / Request for Health Information ?Initial Comment Caller states, was seen to speak with her PCP by ?her psychiatrist. Having a problem with forming ?sentences. Feeling fatigue. Office has no appt. ?today. only virtual. Having headaches, dizziness. ?BP was127/93. Now normal. ?Translation No ?Nurse Assessment ?Nurse: Fredderick Phenix, RN, Lelan Pons Date/Time (Eastern Time): 10/29/2021 10:00:37 AM ?Confirm and document reason for call. If ?symptomatic, describe symptoms. ?---Caller states she is dizzy, especially when she ?first gets up. She can't think of her words, they aren't ?coming out right. S/s started this morning. She doesn't ?feel right. ?Does the patient have any new or worsening ?symptoms? ---Yes ?Will a triage be completed? ---Yes ?Related visit to physician within the last 2 weeks? ---No ?Does the PT have any chronic conditions? (i.e. ?diabetes, asthma, this includes High risk factors for ?pregnancy, etc.) ?---Yes ?List chronic conditions. ---Parkinsons disease, stroke hx ?Is this a behavioral health or substance abuse call? ---No ?Guidelines ?Guideline Title Affirmed Question Affirmed Notes Nurse Date/Time (Eastern ?Time) ?Dizziness - ?Lightheadedness ?[1] Loss of speech or ?garbled speech AND ?[2] sudden onset ?AND [3] present  now ?Fredderick Phenix, RN, Lelan Pons 10/29/2021 10:04:02 ?AM ?PLEASE NOTE: All timestamps contained within this report are represented as [place] Standard Time. ?CONFIDENTIALTY NOTICE: This fax transmission is intended only for the addressee. It contains information that is legally privileged, confidential or ?otherwise protected from use or disclosure. If you are not the intended recipient, you are strictly prohibited from reviewing, disclosing, copying using ?or disseminating any of this information or taking any action in reliance on or regarding this information. If you have received this fax in error, please ?notify us immediately by telephone so that we can arrange for its return to [place]. Phone: [phone], Toll-Free: [phone], Fax: [phone] ?Page: 2 of 2 ?Call Id: [id number] ?Disp. Time (Eastern ?Time) Disposition Final User ?10/29/2021 9:58:46 AM Send to Urgent Georjean Mode, Francena Hanly ?10/29/2021 10:11:55 AM 911 Outcome Documentation Fredderick Phenix, RN, Lelan Pons ?Reason: EMS is enroute. ?10/29/2021 10:05:25 AM Call EMS 911 Now Yes Fredderick Phenix, RN, Lelan Pons ?Caller Disagree/Comply Comply ?Caller Understands Yes ?PreDisposition Call Doctor ?Care Advice Given Per Guideline ?CALL EMS 911 NOW: * Immediate medical attention is needed. You need to hang up and call 911 (or an ambulance). * Triager ?Discretion: I'll call you back in a few minutes to be sure you were able to reach them. CARE ADVICE given per Dizziness (Adult) ?guideline. ?Referrals ?[place]

## 2021-10-29 NOTE — Consult Note (Signed)
Neurology Consultation ?Reason for Consult: Speech difficulty ?Referring Physician: Rosalyn Gess ? ?CC: Speech difficulty ? ?History is obtained from: Patient ? ?HPI: Kristina Savage is a 61 y.o. 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  She notes that when she for started speaking today, she was having difficulty getting her words out.  She describes it as "the words are there, I just cannot get them out."  She states that she has had similar episodes in the past, usually associated with anxiety, but it is worse this time than it has been before. ? ? ?LKW: 4/5, prior to bed ?tpa given?: no, outside of window ? ?ROS: A 14 point ROS was performed and is negative except as noted in the HPI.  ?Past Medical History:  ?Diagnosis Date  ? Anxiety   ? Chicken pox   ? Depression   ? Diverticulitis   ? Dizziness   ? Genital warts   ? GERD (gastroesophageal reflux disease)   ? Hay fever   ? Hyperlipidemia   ? Ovarian cyst   ? Pre-diabetes   ? Stroke [facility])   ? UTI (lower urinary tract infection)   ? ? ? ?Family History  ?Problem Relation Age of Onset  ? Arthritis Mother   ? Hypertension Mother   ? COPD Mother   ? Heart failure Mother   ? Arthritis Father   ? Thyroid disease Father   ?     thinks cancer  ? Parkinson's disease Father   ? Alcohol abuse Sister   ? Cancer Maternal Aunt   ?     Female cancer - not sure what  ? Breast cancer Maternal Aunt   ? Prostate cancer Maternal Uncle   ? Colon cancer Maternal Uncle 63  ? Breast cancer Maternal Grandmother   ? Diabetes Paternal Grandmother   ? Parkinson's disease Paternal Grandmother   ? Diabetes Son   ? ? ? ?Social History:  reports that she has been smoking cigarettes. She has a 7.50 pack-year smoking history. She has been exposed to tobacco smoke. She has never used smokeless tobacco. She reports that she does not currently use alcohol. She reports that she does not use  drugs. ? ? ?Exam: ?Current vital signs: ?BP 131/83   Pulse 61   Temp 98 ?F (36.7 ?C) (Oral)   Resp 16   Ht '5\' 4"'$  (1.626 m)   Wt 128.7 kg   SpO2 96%   BMI 48.70 kg/m?  ?Vital signs in last 24 hours: ?Temp:  [98 ?F (36.7 ?C)] 98 ?F (36.7 ?C) (04/06 1116) ?Pulse Rate:  [61-62] 61 (04/06 1120) ?Resp:  [12-16] 16 (04/06 1120) ?BP: (131)/(83) 131/83 (04/06 1116) ?SpO2:  [94 %-96 %] 96 % (04/06 1120) ?Weight:  [128.7 kg] 128.7 kg (04/06 1117) ? ? ?Physical Exam  ?Constitutional: Appears well-developed and well-nourished.  ?Psych: Affect appropriate to situation ?Eyes: No scleral injection ?HENT: No OP obstruction ?MSK: no joint deformities.  ?Cardiovascular: Normal rate and regular rhythm.  ?Respiratory: Effort normal, non-labored breathing ?GI: Soft.  No distension. There is no tenderness.  ?Skin: WDI ? ?Neuro: ?Mental Status: ?Patient is awake, alert, oriented to person, place, month, year, and situation. ?Patient is able to give a clear and coherent history. ?No signs of neglect.  She has variable degrees of expression difficulty, as well as stuttering speech. ?Cranial Nerves: ?II: Visual Fields are full. Pupils are  equal, round, and reactive to light.   ?III,IV, VI: EOMI without ptosis or diploplia.  ?V: Facial sensation is symmetric to temperature ?VII: Facial movement is symmetric.  ?VIII: hearing is intact to voice ?X: Uvula elevates symmetrically ?XI: Shoulder shrug is symmetric. ?XII: tongue is midline without atrophy or fasciculations.  ?Motor: ?Tone is normal.  She has a prominent resting tremor of the left arm which abates with movement.  Bulk is normal. 5/5 strength was present in all four extremities.  ?Sensory: ?Sensation is symmetric to light touch and temperature in the arms and legs. ?Cerebellar: ?FNF and HKS are intact bilaterally ? ? ?I have reviewed labs in epic and the results pertinent to this consultation are: ? ?CBC-unremarkable ? ? ?Impression: 61 year old female with variable degrees of  expression difficulty and stuttering.  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  I would favor getting an MRI/MRA head and neck, if this is negative, however, I would not pursue any further neurological testing.  Could consider treating as complicated migraine if imaging is negative. ? ?Recommendations: ?1) MRI brain, MRA head and neck ?2) stroke work-up only if positive. ? ? ?Roland Rack, MD ?[facility] ?[phone] ? ?If 7pm- 7am, please page neurology on call as listed in [place]. ? ?

## 2021-10-29 NOTE — Telephone Encounter (Signed)
Unable to reach pt or pts husband (DPR signed) by phone and per access nurse note pt agreed to go to ED for eval.  I called again and spoke with pts husband and EMS took pt to ED about 10 mins ago. Mr Kristina Savage is not sure which hospital they were going to.Sending note to Dr Einar Pheasant who is out of office but per [facility] CMA looking at desktop and sending to [facility] as well.  ?

## 2021-10-29 NOTE — ED Triage Notes (Addendum)
Pt c/o headaches and dizziness x1 week and noticed today around 0830 she was having trouble finding words. Pt stated she is concerned it may be due to recent changes in medications. Pt stated she feels like her speech has been slurred today, speech is not slurred at this time but pt does seem to have some difficulty finding words ?hX CVA, parkinsons, vertigo ? ?136/80 ?HR 68 ?96% RA ? ?

## 2021-10-29 NOTE — Telephone Encounter (Signed)
Noted.  ?She has arrived at [facility]. ?

## 2021-10-29 NOTE — ED Provider Notes (Addendum)
?[facility] ?Provider Note ? ? ?CSN: [id number] ?Arrival date & time: 10/29/21  1109 ? ?  ? ?History ? ?Chief Complaint  ?Patient presents with  ? Aphasia  ? ? ?Kristina Savage is a 61 y.o. female. ? ?Patient presents with intermittent headache posteriorly and neck pain along with dizziness for 1 week.  Patient woke up this morning with difficulty finding words.  Patient has history of Parkinson's and has had milder episodes in the past but nothing this significant.  Patient has a history of stroke that she said was seen on imaging and has no deficits.  Patient is on medications for Parkinson's and anxiety.  Patient denies any infectious symptoms. ? ? ?  ? ?Home Medications ?Prior to Admission medications   ?Medication Sig Start Date End Date Taking? Authorizing Provider  ?aspirin EC 81 MG tablet Take 81 mg by mouth daily. Swallow whole.    [provider]  ?atorvastatin (LIPITOR) 10 MG tablet TAKE 1 TABLET BY MOUTH EVERY DAY 07/14/21   Lesleigh Noe, MD  ?carbidopa-levodopa (SINEMET IR) 25-100 MG tablet Take 1 tablet by mouth 3 (three) times daily. 12/17/20   [provider]  ?clonazePAM (KLONOPIN) 0.5 MG tablet Take 0.5-1 tablets (0.25-0.5 mg total) by mouth as directed. Take half to 1 tablet 1-2 times a week for severe anxiety, agitation 09/23/21   Ursula Alert, MD  ?ergocalciferol (VITAMIN D2) 1.25 MG (50000 UT) capsule Take 1 capsule by mouth once a week. 01/08/21   [provider]  ?escitalopram (LEXAPRO) 20 MG tablet TAKE 1 TABLET BY MOUTH EVERY DAY 10/19/21   Ursula Alert, MD  ?ibuprofen (ADVIL) 600 MG tablet Take 1 tablet (600 mg total) by mouth every 6 (six) hours as needed. 10/12/19   LampteyMyrene Galas, MD  ?metFORMIN (GLUCOPHAGE) 500 MG tablet Take 1 tablet by mouth 2 (two) times daily. 08/20/20   [provider]  ?propranolol (INDERAL) 10 MG tablet TAKE 1 TABLET (10 MG TOTAL) BY MOUTH 2 (TWO) TIMES DAILY AS NEEDED. FOR SEVERE  ANXIETY 09/23/21   Ursula Alert, MD  ?Vitamin D, Ergocalciferol, (DRISDOL) 1.25 MG (50000 UNIT) CAPS capsule Take 1 capsule by mouth once a week. 01/08/21   [provider]  ?PROVENTIL HFA 108 (90 BASE) MCG/ACT inhaler Inhale 2 puffs into the lungs daily as needed for shortness of breath.  05/17/14 05/01/19  [provider]  ?   ? ?Allergies    ?Oxycodone-acetaminophen, Percocet [oxycodone-acetaminophen], and Pollen extract   ? ?Review of Systems   ?Review of Systems  ?Constitutional:  Positive for fatigue. Negative for chills and fever.  ?HENT:  Negative for congestion.   ?Eyes:  Negative for visual disturbance.  ?Respiratory:  Negative for shortness of breath.   ?Cardiovascular:  Negative for chest pain.  ?Gastrointestinal:  Negative for abdominal pain and vomiting.  ?Genitourinary:  Negative for dysuria and flank pain.  ?Musculoskeletal:  Positive for neck pain. Negative for back pain and neck stiffness.  ?Skin:  Negative for rash.  ?Neurological:  Positive for speech difficulty, light-headedness and headaches. Negative for seizures.  ? ?Physical Exam ?Updated Vital Signs ?BP 104/73   Pulse 60   Temp 98 ?F (36.7 ?C) (Oral)   Resp 12   Ht '5\' 4"'$  (1.626 m)   Wt 128.7 kg   SpO2 97%   BMI 48.70 kg/m?  ?Physical Exam ?Vitals and nursing note reviewed.  ?Constitutional:   ?   General: She is not in acute distress. ?  Appearance: She is well-developed.  ?HENT:  ?   Head: Normocephalic and atraumatic.  ?   Mouth/Throat:  ?   Mouth: Mucous membranes are moist.  ?Eyes:  ?   General:     ?   Right eye: No discharge.     ?   Left eye: No discharge.  ?   Conjunctiva/sclera: Conjunctivae normal.  ?Neck:  ?   Trachea: No tracheal deviation.  ?Cardiovascular:  ?   Rate and Rhythm: Normal rate and regular rhythm.  ?   Heart sounds: No murmur heard. ?Pulmonary:  ?   Effort: Pulmonary effort is normal.  ?   Breath sounds: Normal breath sounds.  ?Abdominal:  ?   General: There is no distension.  ?    Palpations: Abdomen is soft.  ?   Tenderness: There is no abdominal tenderness. There is no guarding.  ?Musculoskeletal:  ?   Cervical back: Normal range of motion and neck supple. No rigidity.  ?Skin: ?   General: Skin is warm.  ?   Capillary Refill: Capillary refill takes less than 2 seconds.  ?   Findings: No rash.  ?Neurological:  ?   General: No focal deficit present.  ?   Mental Status: She is alert.  ?   GCS: GCS eye subscore is 4. GCS verbal subscore is 5. GCS motor subscore is 6.  ?   Cranial Nerves: No cranial nerve deficit or dysarthria.  ?   Motor: Tremor present. No weakness or abnormal muscle tone.  ?   Coordination: Coordination normal.  ?   Comments: Patient had a few episodes of word finding difficulties during discussion.  ?Psychiatric:     ?   Mood and Affect: Mood is anxious.  ? ? ?ED Results / Procedures / Treatments   ?Labs ?(all labs ordered are listed, but only abnormal results are displayed) ?Labs Reviewed  ?COMPREHENSIVE METABOLIC PANEL - Abnormal; Notable for the following components:  ?    Result Value  ? Glucose, Bld 100 (*)   ? All other components within normal limits  ?RESP PANEL BY RT-PCR (FLU A&B, COVID) ARPGX2  ?ETHANOL  ?PROTIME-INR  ?APTT  ?CBC WITH DIFFERENTIAL/PLATELET  ?CBC  ?DIFFERENTIAL  ?RAPID URINE DRUG SCREEN, HOSP PERFORMED  ?URINALYSIS, ROUTINE W REFLEX MICROSCOPIC  ? ? ?EKG ?None ? ?Radiology ?MR ANGIO HEAD WO CONTRAST ? ?Result Date: 10/29/2021 ?CLINICAL DATA:  Word finding difficulty EXAM: MRI HEAD WITHOUT CONTRAST MRA HEAD WITHOUT CONTRAST MRA NECK WITHOUT CONTRAST TECHNIQUE: Multiplanar, multiecho pulse sequences of the brain and surrounding structures were obtained without intravenous contrast. Angiographic images of the Circle of Willis were obtained using MRA technique without intravenous contrast. Angiographic images of the neck were obtained using MRA technique without intravenous contrast. Carotid stenosis measurements (when applicable) are obtained utilizing  NASCET criteria, using the distal internal carotid diameter as the denominator. COMPARISON:  Brain MRI 08/17/2020 FINDINGS: MRI HEAD FINDINGS Brain: There is no evidence of acute intracranial hemorrhage, extra-axial fluid collection, or acute infarct. Background parenchymal volume is normal. The ventricles are normal in size. There is a remote infarct in the left cerebellar hemisphere, unchanged since 2022. Scattered small foci of FLAIR signal abnormality in the subcortical and periventricular white matter are nonspecific but likely reflects sequela of mild chronic white matter microangiopathy, unchanged. There is no suspicious parenchymal signal abnormality. There is no mass lesion. There is no mass effect or midline shift. Vascular: The major intracranial flow voids are normal. The vasculature is assessed in  full below. Skull and upper cervical spine: Normal marrow signal. Sinuses/Orbits: There is mild mucosal thickening in the paranasal sinuses. The globes and orbits are unremarkable. Other: None. MRA HEAD FINDINGS Anterior circulation: The intracranial ICAs are patent The bilateral MCAs are patent. The bilateral ACAs are patent. The anterior communicating artery is normal. There is no aneurysm or AVM. Posterior circulation: The bilateral V4 segments are patent. 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 PICA is identified bilaterally. The basilar artery is patent. The bilateral PCAs are patent. The right posterior communicating artery is identified. The left posterior communicating artery is not definitely seen. There is no aneurysm or AVM. Anatomic variants: None. MRA NECK FINDINGS Aortic arch: The imaged aortic arch is unremarkable. The origins of the major branch vessels appear patent. There is a common origin of the brachiocephalic and left common carotid arteries, a normal variant. Right carotid system: Right common, internal, and external carotid arteries are patent, without evidence of  hemodynamically significant stenosis or occlusion. There is no evidence of dissection or aneurysm. Left carotid system: The left common, internal, and external carotid arteries are patent, without evidence of hemodynamically significant

## 2021-11-02 ENCOUNTER — Ambulatory Visit: Payer: Managed Care, Other (non HMO) | Admitting: Neurology

## 2021-11-02 ENCOUNTER — Encounter: Payer: Self-pay | Admitting: Neurology

## 2021-11-02 VITALS — BP 121/78 | HR 60 | Ht 64.0 in | Wt 292.0 lb

## 2021-11-02 DIAGNOSIS — G4733 Obstructive sleep apnea (adult) (pediatric): Secondary | ICD-10-CM

## 2021-11-02 DIAGNOSIS — Z9989 Dependence on other enabling machines and devices: Secondary | ICD-10-CM

## 2021-11-02 NOTE — Patient Instructions (Signed)
It was nice to see you again today.  ?I am glad you have adjusted well to your autoPAP machine, you are fully compliant, keep up the good work! ?Please continue using your autoPAP regularly. While your insurance requires that you use PAP at least 4 hours each night on 70% of the nights, I recommend, that you not skip any nights and use it throughout the night if you can. Getting used to PAP and staying with the treatment long term does take time and patience and discipline. Untreated obstructive sleep apnea when it is moderate to severe can have an adverse impact on cardiovascular health and raise her risk for heart disease, arrhythmias, hypertension, congestive heart failure, stroke and diabetes. Untreated obstructive sleep apnea causes sleep disruption, nonrestorative sleep, and sleep deprivation. This can have an impact on your day to day functioning and cause daytime sleepiness and impairment of cognitive function, memory loss, mood disturbance, and problems focussing. Using PAP regularly can improve these symptoms. ?Please continue to work on weight loss.  ?We can see you in 1 year, you can see one of our nurse practitioners.  ? ? ? ? ?

## 2021-11-02 NOTE — Progress Notes (Signed)
Subjective:  ?  ?Patient ID: Kristina Savage is a 61 y.o. female. ? ?HPI ? ? ? ?Interim history:  ? ?Kristina Savage is a 61 year old left-handed woman with an underlying medical history of reflux disease, prediabetes, diverticulitis, dizziness, anxiety, depression, smoking, and severe obesity with a BMI of over 40, who presents for follow-up consultation of her obstructive sleep apnea after starting AutoPap therapy.  The patient is accompanied by her husband today.  I first met her at the request of her primary care physician on 08/04/2020, at which time we talked about her tremor of approximately 1 years duration.  She had no signs of parkinsonism.  We also talked about her sleepiness during the day as well as snoring.  She was advised to proceed with a brain MRI with and without contrast as well as a sleep study.  She had a brain MRI with and without contrast on 08/17/2020 and I reviewed the results:IMPRESSION: This MRI of the brain with and without contrast shows the following: ?1.   No acute findings.  Normal enhancement pattern. ?2.   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 ?3.   Some scattered T2/FLAIR hyperintense foci in the hemispheres and pons consistent with mild chronic microvascular ischemic change. ?She was notified by phone call.  She had a home sleep test on 08/27/2020, which indicated severe obstructive sleep apnea with an AHI of 36.1/h, O2 nadir 81%.  She was advised to start AutoPap therapy.  Her set up date was 09/09/2021.  She has a ResMed air sense 10 AutoSet machine. ? ?Today, 11/02/2021: I reviewed her AutoPap compliance data from 09/09/2021 through 10/08/2021, which is a total of 30 days, during which time she used her machine every night with percent use days greater than 4 hours at 100%, indicating superb compliance with an average usage of 7 hours and 21 minutes, residual AHI at goal but on the higher side, at 4.6/h, average pressure for the  95th percentile at 11.6 cm with a range of 6 to 12 cm with EPR, leak on the higher side with the 95th percentile at 28.1 L/min.  Leak has increased in the past few weeks.  She reports that she has loosened the headgear a little bit, she can tighten it again.  She has been using an under the nose style fullface mask with good success overall.  She has changed the filter already once.  She is mindful of the cleanliness with the machine and uses distilled water in the humidifier, overall, she feels much improved in terms of her sleep quality and sleep consolidation.  Her husband endorses that she sleeps quieter, no longer snores when she is on the machine but starts snoring if she takes the mask off.  She has been working on weight loss but would like to talk to her primary care about a referral to a weight management clinic.  She does have some mouth dryness.  She has better daytime energy and less daytime sleepiness.  She is very motivated to continue with treatment with her AutoPap machine.  Sometimes when she wakes up in the middle of the night and the pressure feels too high she turns the machine off and on again. ? ?She has seen neurology through [facility] clinic.  She had evaluation through a movement disorders clinic as well for concern for parkinsonism.  She had a DaTscan last year which was reported as normal.  She recently presented to the emergency room  with intermittent difficulty getting her words out.  She had an MR angiogram of the head and neck as well as an MRI of the brain and I reviewed the results: IMPRESSION: ?1. No acute intracranial pathology. ?2. 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 ?Scattered small foci of FLAIR signal abnormality in the subcortical ?and periventricular white matter are nonspecific but most likely ?reflects sequela of mild chronic white matter microangiopathy. ?3. Patent vasculature of the head and neck with no significant ?stenosis or occlusion. ?She has  an appointment pending with Dr. Manuella Ghazi. ? ?The patient's allergies, current medications, family history, past medical history, past social history, past surgical history and problem list were reviewed and updated as appropriate.  ? ?Previously:  ? ?08/04/20: (She) reports an approximately 1 year history of head tremor, typically side to side, not typically noticed by her.  She has had a left hand tremor as well which is intermittent, sometimes it is worse than others.  Her husband has noted that it is worse when she is stressed out.  She reports a family history of tremor and Parkinson's disease.  Paternal grandmother had Parkinson's disease and father had hand tremors.  She also has several other complaints including feeling dizzy and lightheaded, spinning sensation, not feeling well balance, not always picking up her feet, having fallen, having sleep disruption, not feeling fully rested, husband has noted that she is sleepy during the day.  She does snore.  She has never had a sleep study.  Sometimes she feels lightheaded when she bends over.  Of note, she does not drink a lot of water.  She admits to drinking diet Pepsi about 2-3 bottles per day.  She is in the process of smoking cessation, in fact, she had quit smoking in November 2021 but had a recent cigarette.  She is currently not working.  She was started on Prozac about 2 months ago.  She has not necessarily noticed any exacerbation of her tremor since then.  She reports right shoulder pain.   ?  ?eI reviewed your office note from 05/15/2020. ?She had blood work through your office at the time including B12, TSH, A1c, lipid panel, CMP.  I reviewed the results.  Labs were unremarkable, A1c was 6.2, in the prediabetes range. ? ? ?Her Past Medical History Is Significant For: ?Past Medical History:  ?Diagnosis Date  ? Anxiety   ? Chicken pox   ? Depression   ? Diverticulitis   ? Dizziness   ? Genital warts   ? GERD (gastroesophageal reflux disease)   ? Hay fever    ? Hyperlipidemia   ? Ovarian cyst   ? Parkinson's disease ([place])   ? Dr.Shaw [place] and at [place].  Functional/neurological condition.  ? Pre-diabetes   ? Stroke [facility])   ? UTI (lower urinary tract infection)   ? ? ?Her Past Surgical History Is Significant For: ?Past Surgical History:  ?Procedure Laterality Date  ? CERVIX REMOVAL    ? Laser surgery, not complete removal  ? CESAREAN SECTION    ? CHONDROPLASTY Right 06/11/2020  ? Procedure: CHONDROPLASTY;  Surgeon: Netta Cedars, MD;  Location: [facility];  Service: Orthopedics;  Laterality: Right;  ? KNEE ARTHROSCOPY WITH MEDIAL MENISECTOMY Right 06/11/2020  ? Procedure: KNEE ARTHROSCOPY WITH MEDIAL MENISECTOMY;  Surgeon: Netta Cedars, MD;  Location: [facility];  Service: Orthopedics;  Laterality: Right;  ? ? ?Her Family History Is Significant For: ?Family History  ?Problem Relation Age of  Onset  ? Arthritis Mother   ? Hypertension Mother   ? COPD Mother   ? Heart failure Mother   ? Arthritis Father   ? Thyroid disease Father   ?     thinks cancer  ? Parkinson's disease Father   ? Alcohol abuse Sister   ? Cancer Maternal Aunt   ?     Female cancer - not sure what  ? Breast cancer Maternal Aunt   ? Prostate cancer Maternal Uncle   ? Colon cancer Maternal Uncle 49  ? Breast cancer Maternal Grandmother   ? Diabetes Paternal Grandmother   ? Parkinson's disease Paternal Grandmother   ? Diabetes Son   ? ? ?Her Social History Is Significant For: ?Social History  ? ?Socioeconomic History  ? Marital status: Married  ?  Spouse name: [name]  ? Number of children: 4  ? Years of education: High school  ? Highest education level: Not on file  ?Occupational History  ? Occupation: CSR  ?Tobacco Use  ? Smoking status: Every Day  ?  Packs/day: 0.25  ?  Years: 30.00  ?  Pack years: 7.50  ?  Types: Cigarettes  ?  Passive exposure: Past  ? Smokeless tobacco: Never  ?Vaping Use  ? Vaping Use: Not on file  ?Substance and Sexual Activity  ? Alcohol use: Not  Currently  ?  Alcohol/week: 0.0 standard drinks  ?  Comment: once a year  ? Drug use: No  ? Sexual activity: Yes  ?  Birth control/protection: Post-menopausal  ?Other Topics Concern  ? Not on file  ?S

## 2021-11-04 ENCOUNTER — Ambulatory Visit (INDEPENDENT_AMBULATORY_CARE_PROVIDER_SITE_OTHER): Payer: Managed Care, Other (non HMO) | Admitting: Family Medicine

## 2021-11-04 ENCOUNTER — Other Ambulatory Visit: Payer: Self-pay | Admitting: Psychiatry

## 2021-11-04 VITALS — BP 110/80 | HR 67 | Temp 97.6°F | Ht 64.0 in | Wt 290.0 lb

## 2021-11-04 DIAGNOSIS — R42 Dizziness and giddiness: Secondary | ICD-10-CM

## 2021-11-04 DIAGNOSIS — F331 Major depressive disorder, recurrent, moderate: Secondary | ICD-10-CM | POA: Diagnosis not present

## 2021-11-04 DIAGNOSIS — F411 Generalized anxiety disorder: Secondary | ICD-10-CM

## 2021-11-04 DIAGNOSIS — F4010 Social phobia, unspecified: Secondary | ICD-10-CM

## 2021-11-04 NOTE — Patient Instructions (Addendum)
[facility] ?[address] ?[place],  [place]  [id number]  ?Main: [phone] ?Fax: [phone] ? ?Call if having trouble getting appointment ? ? ?Discuss whether Wellbutrin would be a good option with psychiatry -- due to the weight gain  ? ?Dizziness ?- consistently drink 4 bottles of water per day (64 ounces) ?- Do PT suggestions ?- call if you think checking back in with PT would  ?- consider compression socks daily ?

## 2021-11-04 NOTE — Assessment & Plan Note (Signed)
C/b 40 lb weight gain over <18 months. Encouraged continued exercise. Discussed switching to wellbutrin with psych. Number for healthy weight and wellness provided.  ?

## 2021-11-04 NOTE — Assessment & Plan Note (Signed)
Dizziness most likely multifactorial.  Patient has already seen ear nose and throat cardiology and neurology without any clear cause.  History of vertigo but symptoms not completely consistent with that.  Advised trial of increased hydration as well as compression socks.  She is doing behavior modification that she learned from physical therapy encouraged her to continue this.  Reach out if worsening symptoms or lack of improvement could consider PT consult again. ?

## 2021-11-04 NOTE — Progress Notes (Signed)
? ?Subjective:  ? ?  ?Kristina Savage is a 61 y.o. female presenting for Follow-up (Stuttering speech. ) and Referral (Weight loss) ?  ? ? ?HPI ? ?#stuttering ?- last week, went to the ER ?- symptoms have resolved ? ?#Dizziness ?- long term history of vertigo ?- feels woozy like if she had been drinking ?- occurs if she moves too fast  ?- is using her CPAP machine - has helped tremendously ?- so bad she had issues getting up ?- some days wakes up with mild symptoms and limits quick movement ?- PT thinks its more ?- heart doctor says not cardiac ?- ENT said no issue ?- has had MRI ? ?#Weight ?- up from 247 in 05/2020 ?- has been walking ?- no calorie counting but continues to gain weight ?- on lexapro 10 mg ?- seeing psychiatry ? ? ?Review of Systems ? ?10/29/2021: ER - HA, neck pain, word-finding difficulties - MRI stable. Labs stable ? ?Social History  ? ?Tobacco Use  ?Smoking Status Every Day  ? Packs/day: 0.25  ? Years: 30.00  ? Pack years: 7.50  ? Types: Cigarettes  ? Passive exposure: Past  ?Smokeless Tobacco Never  ? ? ? ?   ?Objective:  ?  ?BP Readings from Last 3 Encounters:  ?11/04/21 110/80  ?11/02/21 121/78  ?10/29/21 (!) 137/98  ? ?Wt Readings from Last 3 Encounters:  ?11/04/21 290 lb (131.5 kg)  ?11/02/21 292 lb (132.5 kg)  ?10/29/21 283 lb 11.7 oz (128.7 kg)  ? ? ?BP 110/80   Pulse 67   Temp 97.6 ?F (36.4 ?C) (Oral)   Ht '5\' 4"'$  (1.626 m)   Wt 290 lb (131.5 kg)   SpO2 96%   BMI 49.78 kg/m?  ? ? ?Physical Exam ?Constitutional:   ?   General: She is not in acute distress. ?   Appearance: She is well-developed. She is not diaphoretic.  ?HENT:  ?   Right Ear: External ear normal.  ?   Left Ear: External ear normal.  ?   Nose: Nose normal.  ?Eyes:  ?   Conjunctiva/sclera: Conjunctivae normal.  ?Cardiovascular:  ?   Rate and Rhythm: Normal rate and regular rhythm.  ?   Heart sounds: No murmur heard. ?Pulmonary:  ?   Effort: Pulmonary effort is normal. No respiratory distress.  ?   Breath sounds: Normal  breath sounds. No wheezing.  ?Musculoskeletal:  ?   Cervical back: Neck supple.  ?Skin: ?   General: Skin is warm and dry.  ?   Capillary Refill: Capillary refill takes less than 2 seconds.  ?Neurological:  ?   Mental Status: She is alert. Mental status is at baseline.  ?Psychiatric:     ?   Mood and Affect: Mood normal.     ?   Behavior: Behavior normal.  ? ? ? ? ? ?   ?Assessment & Plan:  ? ?Problem List Items Addressed This Visit   ? ?  ? Other  ? Morbid obesity ([place])  ?  C/b 40 lb weight gain over <18 months. Encouraged continued exercise. Discussed switching to wellbutrin with psych. Number for healthy weight and wellness provided.  ?  ?  ? Postural dizziness  ?  Dizziness most likely multifactorial.  Patient has already seen ear nose and throat cardiology and neurology without any clear cause.  History of vertigo but symptoms not completely consistent with that.  Advised trial of increased hydration as well as compression socks.  She is  doing behavior modification that she learned from physical therapy encouraged her to continue this.  Reach out if worsening symptoms or lack of improvement could consider PT consult again. ?  ?  ? MDD (major depressive disorder), recurrent episode, moderate ([place]) - Primary  ?  Worsening dizziness with higher doses of lexapro. Taking 10 mg. Has psychiatry - appreciate support. Advised discussing wellbutrin as option due to weight gain.  ?  ?  ? ?I spent 35 minutes with pt , obtaining history, examining, reviewing chart, documenting encounter and discussing the above plan of care. ? ? ?Return if symptoms worsen or fail to improve. ? ?Lesleigh Noe, MD ? ? ? ?

## 2021-11-04 NOTE — Assessment & Plan Note (Signed)
Worsening dizziness with higher doses of lexapro. Taking 10 mg. Has psychiatry - appreciate support. Advised discussing wellbutrin as option due to weight gain.  ?

## 2021-11-05 ENCOUNTER — Telehealth: Payer: Self-pay | Admitting: Family Medicine

## 2021-11-05 NOTE — Telephone Encounter (Signed)
Pt called stating that Dr Einar Pheasant stated that if she needed a referral to the [facility] to let her know. Pt states that they will need a referral from Dr Einar Pheasant to take pt. Please advise. ?

## 2021-11-06 NOTE — Telephone Encounter (Signed)
Notified pt of referral placed ?

## 2021-11-06 NOTE — Addendum Note (Signed)
Addended by: [name] R on: 11/06/2021 10:07 AM ? ? Modules accepted: Orders ? ?

## 2021-11-06 NOTE — Telephone Encounter (Signed)
Referral placed.

## 2021-11-24 ENCOUNTER — Encounter: Payer: Self-pay | Admitting: Psychiatry

## 2021-11-24 ENCOUNTER — Ambulatory Visit (INDEPENDENT_AMBULATORY_CARE_PROVIDER_SITE_OTHER): Payer: 59 | Admitting: Psychiatry

## 2021-11-24 VITALS — BP 137/90 | HR 69 | Temp 97.6°F | Wt 280.2 lb

## 2021-11-24 DIAGNOSIS — F331 Major depressive disorder, recurrent, moderate: Secondary | ICD-10-CM

## 2021-11-24 DIAGNOSIS — F411 Generalized anxiety disorder: Secondary | ICD-10-CM

## 2021-11-24 DIAGNOSIS — F4010 Social phobia, unspecified: Secondary | ICD-10-CM | POA: Diagnosis not present

## 2021-11-24 MED ORDER — DULOXETINE HCL 20 MG PO CPEP: 20.0000 mg | ORAL_CAPSULE | ORAL | 1 refills | Status: DC

## 2021-11-24 MED ORDER — ESCITALOPRAM OXALATE 5 MG PO TABS: 5.0000 mg | ORAL_TABLET | ORAL | 0 refills | Status: DC

## 2021-11-24 NOTE — Progress Notes (Signed)
[facility] MD OP Progress Note ? ?11/24/2021 9:05 AM ?[name]  ?MRN:  [id number] ? ?Chief Complaint:  ?Chief Complaint  ?Patient presents with  ? Follow-up: 61 year old Caucasian female with history of depression, anxiety, parkinsonian symptoms, presented with worsening anxiety, possible side effects to Lexapro.  ? ?HPI: Kristina Savage is a 61 year old Caucasian female, married, unemployed, currently applying for disability, lives in [place], has a history of MDD, GAD, social anxiety disorder, parkinsonism, prediabetes, hyperlipidemia, history of left cerebellar stroke, currently on aspirin plus statin therapy, history of hypovitaminosis, history of obstructive sleep apnea on CPAP, presented for medication management. ? ?Patient today reports she continues to struggle with anxiety.  Recently had an episode when she was talking to her therapist and could not find her words.  Patient hence was taken to the emergency department.  Reviewed notes per Dr.Zavitz dated 10/29/2021-patient with expressive aphasia-was medically cleared. ? ?Patient reports she continues to feel overwhelmed, on edge often.  Patient today reports she is worried about her daughter who is currently struggling with substance abuse. ? ?Patient although has sadness reports she is improving with that.  She also continues to feel bad about herself. ? ?Reports sleep is good.  Currently on CPAP. ? ?Continues to have Parkinsonian some symptoms, currently under the care of neurologist.  Observed as having tremors mostly on her left upper arm. ? ?Denies any suicidality, homicidality or perceptual disturbances. ? ?Patient reports she felt kind of dizzy and sweated a lot after she walked into our office this morning.  Patient however reports she feels better now.  Likely due to anxiety versus walking from the parking lot to our office today.  Patient reports she does not do any regular exercise.  Patient also morbidly obese. ? ?Patient currently taking Lexapro 10  mg, did not tolerate the higher dosage, caused her to have excessive sleepiness and dizziness.  Does not believe the Lexapro is beneficial. ? ?Visit Diagnosis:  ?  ICD-10-CM   ?1. MDD (major depressive disorder), recurrent episode, moderate ([facility])  F33.1 escitalopram (LEXAPRO) 5 MG tablet  ?  DULoxetine (CYMBALTA) 20 MG capsule  ?  ?2. GAD (generalized anxiety disorder)  F41.1 escitalopram (LEXAPRO) 5 MG tablet  ?  DULoxetine (CYMBALTA) 20 MG capsule  ?  ?3. Social anxiety disorder  F40.10 escitalopram (LEXAPRO) 5 MG tablet  ?  DULoxetine (CYMBALTA) 20 MG capsule  ?  ? ? ?Past Psychiatric History: Reviewed past psychiatric history from progress note on 09/01/2021.  Past trials of Lexapro, Prozac ? ?Past Medical History:  ?Past Medical History:  ?Diagnosis Date  ? Anxiety   ? Chicken pox   ? Depression   ? Diverticulitis   ? Dizziness   ? Genital warts   ? GERD (gastroesophageal reflux disease)   ? Hay fever   ? Hyperlipidemia   ? Ovarian cyst   ? Parkinson's disease ([place])   ? Dr.Shaw [place] and at [place].  Functional/neurological condition.  ? Pre-diabetes   ? Stroke [facility])   ? UTI (lower urinary tract infection)   ?  ?Past Surgical History:  ?Procedure Laterality Date  ? CERVIX REMOVAL    ? Laser surgery, not complete removal  ? CESAREAN SECTION    ? CHONDROPLASTY Right 06/11/2020  ? Procedure: CHONDROPLASTY;  Surgeon: Netta Cedars, MD;  Location: [facility];  Service: Orthopedics;  Laterality: Right;  ? KNEE ARTHROSCOPY WITH MEDIAL MENISECTOMY Right 06/11/2020  ? Procedure: KNEE ARTHROSCOPY WITH MEDIAL MENISECTOMY;  Surgeon: Netta Cedars,  MD;  Location: [facility];  Service: Orthopedics;  Laterality: Right;  ? ? ?Family Psychiatric History: Reviewed family psychiatric history from progress note on 09/01/2021. ? ?Family History:  ?Family History  ?Problem Relation Age of Onset  ? Arthritis Mother   ? Hypertension Mother   ? COPD Mother   ? Heart failure Mother   ? Arthritis Father   ?  Thyroid disease Father   ?     thinks cancer  ? Parkinson's disease Father   ? Alcohol abuse Sister   ? Cancer Maternal Aunt   ?     Female cancer - not sure what  ? Breast cancer Maternal Aunt   ? Prostate cancer Maternal Uncle   ? Colon cancer Maternal Uncle 68  ? Breast cancer Maternal Grandmother   ? Diabetes Paternal Grandmother   ? Parkinson's disease Paternal Grandmother   ? Diabetes Son   ? ? ?Social History: Reviewed social history from progress note on 09/01/2021. ?Social History  ? ?Socioeconomic History  ? Marital status: Married  ?  Spouse name: [name]  ? Number of children: 4  ? Years of education: High school  ? Highest education level: Not on file  ?Occupational History  ? Occupation: CSR  ?Tobacco Use  ? Smoking status: Every Day  ?  Packs/day: 0.25  ?  Years: 30.00  ?  Pack years: 7.50  ?  Types: Cigarettes  ?  Passive exposure: Past  ? Smokeless tobacco: Never  ?Vaping Use  ? Vaping Use: Not on file  ?Substance and Sexual Activity  ? Alcohol use: Not Currently  ?  Alcohol/week: 0.0 standard drinks  ?  Comment: once a year  ? Drug use: No  ? Sexual activity: Yes  ?  Birth control/protection: Post-menopausal  ?Other Topics Concern  ? Not on file  ?Social History Narrative  ? 04/24/20  ? From: [place] - moved to [place] to raise her family  ? Living: with husband, [name] [phone])  ?   ?   ? Family: 7 children - including step children - twin boys nearby and daughter nearby - 10 grandchildren  ?   ? Enjoys: spend time with family  ?   ? Exercise: knee limiting exercise - tries to walk  ? Diet: eats whatever  ?   ? Safety  ? Seat belts: Yes   ? Guns: Yes  and secure  ? Safe in relationships: Yes   ? ?Social Determinants of Health  ? ?Financial Resource Strain: Not on file  ?Food Insecurity: Not on file  ?Transportation Needs: Not on file  ?Physical Activity: Not on file  ?Stress: Not on file  ?Social Connections: Not on file  ? ? ?Allergies:  ?Allergies  ?Allergen Reactions  ? Oxycodone-Acetaminophen Nausea And  Vomiting  ? Percocet [Oxycodone-Acetaminophen] Nausea And Vomiting  ? Pollen Extract   ?  Nasal congestion  ? ? ?Metabolic Disorder Labs: ?Lab Results  ?Component Value Date  ? HGBA1C 6.2 05/15/2020  ? ?No results found for: PROLACTIN ?Lab Results  ?Component Value Date  ? CHOL 154 03/10/2021  ? TRIG 82.0 03/10/2021  ? HDL 63.90 03/10/2021  ? CHOLHDL 2 03/10/2021  ? VLDL 16.4 03/10/2021  ? Jacobus 73 03/10/2021  ? LDLCALC 118 (H) 05/15/2020  ? ?Lab Results  ?Component Value Date  ? TSH 2.11 05/15/2020  ? TSH 1.26 08/07/2013  ? ? ?Therapeutic Level Labs: ?No results found for: LITHIUM ?No results found for: VALPROATE ?No components found  for:  CBMZ ? ?Current Medications: ?Current Outpatient Medications  ?Medication Sig Dispense Refill  ? aspirin EC 81 MG tablet Take 81 mg by mouth daily. Swallow whole.    ? atorvastatin (LIPITOR) 10 MG tablet TAKE 1 TABLET BY MOUTH EVERY DAY 90 tablet 3  ? carbidopa-levodopa (SINEMET IR) 25-100 MG tablet Take 2 tablets by mouth 3 (three) times daily.    ? clonazePAM (KLONOPIN) 0.5 MG tablet Take 0.5-1 tablets (0.25-0.5 mg total) by mouth as directed. Take half to 1 tablet 1-2 times a week for severe anxiety, agitation 7 tablet 0  ? DULoxetine (CYMBALTA) 20 MG capsule Take 1-2 capsules (20-40 mg total) by mouth as directed. Take 1 capsule daily for 1 week and increase to 1 capsule twice daily after that. 60 capsule 1  ? escitalopram (LEXAPRO) 5 MG tablet Take 1 tablet (5 mg total) by mouth as directed. Take 1 tablet daily for 7 days and stop . 7 tablet 0  ? ibuprofen (ADVIL) 600 MG tablet Take 1 tablet (600 mg total) by mouth every 6 (six) hours as needed. 30 tablet 0  ? metFORMIN (GLUCOPHAGE) 500 MG tablet Take 1 tablet by mouth 2 (two) times daily.    ? propranolol (INDERAL) 10 MG tablet TAKE 1 TABLET (10 MG TOTAL) BY MOUTH 2 (TWO) TIMES DAILY AS NEEDED. FOR SEVERE ANXIETY 60 tablet 1  ? ?No current facility-administered medications for this visit.   ? ? ? ?Musculoskeletal: ?Strength & Muscle Tone: within normal limits ?Gait & Station: normal ?Patient leans: N/A ? ?Psychiatric Specialty Exam: ?Review of Systems  ?Neurological:  Positive for tremors.  ?Psychiatric/Behavioral:  P

## 2021-11-24 NOTE — Patient Instructions (Signed)
Duloxetine Delayed-Release Capsules ?What is this medication? ?DULOXETINE (doo LOX e teen) treats depression, anxiety, fibromyalgia, and certain types of chronic pain such as nerve, bone, or joint pain. It increases the amount of serotonin and norepinephrine in the brain, hormones that help regulate mood and pain. It belongs to a group of medications called SNRIs. ?This medicine may be used for other purposes; ask your health care provider or pharmacist if you have questions. ?COMMON BRAND NAME(S): Cymbalta, Drizalma, Irenka ?What should I tell my care team before I take this medication? ?They need to know if you have any of these conditions: ?Bipolar disorder ?Glaucoma ?High blood pressure ?Kidney disease ?Liver disease ?Seizures ?Suicidal thoughts, plans or attempt; a previous suicide attempt by you or a family member ?Take medications that treat or prevent blood clots ?Taken medications called MAOIs like Carbex, Eldepryl, Marplan, Nardil, and Parnate within 14 days ?Trouble passing urine ?An unusual reaction to duloxetine, other medications, foods, dyes, or preservatives ?Pregnant or trying to get pregnant ?Breast-feeding ?How should I use this medication? ?Take this medication by mouth with a glass of water. Follow the directions on the prescription label. Do not crush, cut or chew some capsules of this medication. Some capsules may be opened and sprinkled on applesauce. Check with your care team or pharmacist if you are not sure. You can take this medication with or without food. Take your medication at regular intervals. Do not take your medication more often than directed. Do not stop taking this medication suddenly except upon the advice of your care team. Stopping this medication too quickly may cause serious side effects or your condition may worsen. ?A special MedGuide will be given to you by the pharmacist with each prescription and refill. Be sure to read this information carefully each time. ?Talk to  your care team regarding the use of this medication in children. While this medication may be prescribed for children as young as 77 years of age for selected conditions, precautions do apply. ?Overdosage: If you think you have taken too much of this medicine contact a poison control center or emergency room at once. ?NOTE: This medicine is only for you. Do not share this medicine with others. ?What if I miss a dose? ?If you miss a dose, take it as soon as you can. If it is almost time for your next dose, take only that dose. Do not take double or extra doses. ?What may interact with this medication? ?Do not take this medication with any of the following: ?Desvenlafaxine ?Levomilnacipran ?Linezolid ?MAOIs like Carbex, Eldepryl, [place], Marplan, Nardil, and Parnate ?Methylene blue (injected into a vein) ?Milnacipran ?Safinamide ?Thioridazine ?Venlafaxine ?Viloxazine ?This medication may also interact with the following: ?Alcohol ?Amphetamines ?Aspirin and aspirin-like medications ?Certain antibiotics like ciprofloxacin and enoxacin ?Certain medications for blood pressure, heart disease, irregular heart beat ?Certain medications for depression, anxiety, or psychotic disturbances ?Certain medications for migraine headache like almotriptan, eletriptan, frovatriptan, naratriptan, rizatriptan, sumatriptan, zolmitriptan ?Certain medications that treat or prevent blood clots like warfarin, enoxaparin, and dalteparin ?Cimetidine ?Fentanyl ?Lithium ?NSAIDS, medications for pain and inflammation, like ibuprofen or naproxen ?Phentermine ?Procarbazine ?Rasagiline ?Sibutramine ?St. John's wort ?Theophylline ?Tramadol ?Tryptophan ?This list may not describe all possible interactions. Give your health care provider a list of all the medicines, herbs, non-prescription drugs, or dietary supplements you use. Also tell them if you smoke, drink alcohol, or use illegal drugs. Some items may interact with your medicine. ?What should I watch  for while using this medication? ?Tell your  care team if your symptoms do not get better or if they get worse. Visit your care team for regular checks on your progress. Because it may take several weeks to see the full effects of this medication, it is important to continue your treatment as prescribed by your care team. ?This medication may cause serious skin reactions. They can happen weeks to months after starting the medication. Contact your care team right away if you notice fevers or flu-like symptoms with a rash. The rash may be red or purple and then turn into blisters or peeling of the skin. Or, you might notice a red rash with swelling of the face, lips, or lymph nodes in your neck or under your arms. ?Watch for new or worsening thoughts of suicide or depression. This includes sudden changes in mood, behaviors, or thoughts. These changes can happen at any time but are more common in the beginning of treatment or after a change in dose. Call your care team right away if you experience these thoughts or worsening depression. ?Manic episodes may happen in patients with bipolar disorder who take this medication. Watch for changes in feelings or behaviors such as feeling anxious, nervous, agitated, panicky, irritable, hostile, aggressive, impulsive, severely restless, overly excited and hyperactive, or trouble sleeping. These symptoms can happen at any time, but are more common in the beginning of treatment or after a change in dose. Call your care team right away if you notice any of these symptoms. ?You may get drowsy or dizzy. Do not drive, use machinery, or do anything that needs mental alertness until you know how this medication affects you. Do not stand or sit up quickly, especially if you are an older patient. This reduces the risk of dizzy or fainting spells. Alcohol may interfere with the effect of this medication. Avoid alcoholic drinks. ?This medication may increase blood sugar. The risk may be  higher in patients who already have diabetes. Ask your care team what you can do to lower your risk of diabetes while taking this medication. ?This medication can cause an increase in blood pressure. This medication can also cause a sudden drop in your blood pressure, which may make you feel faint and increase the chance of a fall. These effects are most common when you first start the medication or when the dose is increased, or during use of other medications that can cause a sudden drop in blood pressure. Check with your care team for instructions on monitoring your blood pressure while taking this medication. ?Your mouth may get dry. Chewing sugarless gum or sucking hard candy, and drinking plenty of water, may help. Contact your care team if the problem does not go away or is severe. ?What side effects may I notice from receiving this medication? ?Side effects that you should report to your care team as soon as possible: ?Allergic reactions--skin rash, itching, hives, swelling of the face, lips, tongue, or throat ?Bleeding--bloody or black, tar-like stools, red or dark brown urine, vomiting blood or brown material that looks like coffee grounds, small, red or purple spots on skin, unusual bleeding or bruising ?Increase in blood pressure ?Liver injury--right upper belly pain, loss of appetite, nausea, light-colored stool, dark yellow or brown urine, yellowing skin or eyes, unusual weakness or fatigue ?Low sodium level--muscle weakness, fatigue, dizziness, headache, confusion ?Redness, blistering, peeling, or loosening of the skin, including inside the mouth ?Serotonin syndrome--irritability, confusion, fast or irregular heartbeat, muscle stiffness, twitching muscles, sweating, high fever, seizures, chills, vomiting, diarrhea ?  Sudden eye pain or change in vision such as blurry vision, seeing halos around lights, vision loss ?Thoughts of suicide or self-harm, worsening mood, feelings of depression ?Trouble passing  urine ?Side effects that usually do not require medical attention (report to your care team if they continue or are bothersome): ?Change in sex drive or performance ?Constipation ?Diarrhea ?Dizziness ?Dr

## 2021-11-26 ENCOUNTER — Other Ambulatory Visit: Payer: Self-pay | Admitting: Psychiatry

## 2021-11-26 DIAGNOSIS — F411 Generalized anxiety disorder: Secondary | ICD-10-CM

## 2021-11-26 DIAGNOSIS — F4010 Social phobia, unspecified: Secondary | ICD-10-CM

## 2021-11-30 ENCOUNTER — Ambulatory Visit (HOSPITAL_COMMUNITY)
Admission: EM | Admit: 2021-11-30 | Discharge: 2021-11-30 | Disposition: A | Discharge status: Home / Self Care | Payer: Commercial Managed Care - HMO | Attending: Internal Medicine | Admitting: Internal Medicine

## 2021-11-30 ENCOUNTER — Encounter (HOSPITAL_COMMUNITY): Payer: Self-pay | Admitting: Emergency Medicine

## 2021-11-30 DIAGNOSIS — L02818 Cutaneous abscess of other sites: Secondary | ICD-10-CM

## 2021-11-30 DIAGNOSIS — L0291 Cutaneous abscess, unspecified: Secondary | ICD-10-CM

## 2021-11-30 MED ORDER — DOXYCYCLINE HYCLATE 100 MG PO CAPS: 100.0000 mg | ORAL_CAPSULE | Freq: Two times a day (BID) | ORAL | 0 refills | Status: DC

## 2021-11-30 NOTE — Discharge Instructions (Addendum)
You were seen today for your abscess to your pubic area. The area was drained today in the clinic and will continue to drain at home over the next few days now that there is a way for infection to get out. Please take the prescribed doxycycline antibiotic for the next 7 days twice daily with food. If you notice any worsening signs of infection such as worsening redness, swelling, increase in drainage, increased pain, or fever, please return to urgent care or your PCP for further evaluation.  ? ?If you develop any new or worsening symptoms or do not improve in the next 2 to 3 days, please return.  If your symptoms are severe, please go to the emergency room.  Follow-up with your primary care provider for further evaluation and management of your symptoms as well as ongoing wellness visits.  I hope you feel better! ?

## 2021-11-30 NOTE — ED Provider Notes (Signed)
[facility]    CSN: [id number] Arrival date & time: 11/30/21  1211      History   Chief Complaint Chief Complaint  Patient presents with   Abscess    HPI Kristina Savage is a 61 y.o. female.   Patient presents to urgent care with a suprapubic abscess that she first noticed one week ago. The abscess has grown in size and has become very tender over the last 2-3 days after patient was playing outside with her grandchildren. She states it hurts to sit down. She has been applying warm compresses to the area to reduce swelling and pain with minimal relief. She denies fever, nausea, urinary symptoms, vomiting, and dizziness. She denies taking any medications for pain related to the abscess. Never had an abscess to this area before. Denies drainage and bleeding from wound. Denies any other aggravating or relieving factors.    Abscess  Past Medical History:  Diagnosis Date   Anxiety    Chicken pox    Depression    Diverticulitis    Dizziness    Genital warts    GERD (gastroesophageal reflux disease)    Hay fever    Hyperlipidemia    Ovarian cyst    Parkinson's disease (HCC)    Dr.Shaw Hallock [place] and at [facility].  Functional/neurological condition.   Pre-diabetes    Stroke [facility])    UTI (lower urinary tract infection)     Patient Active Problem List   Diagnosis Date Noted   MDD (major depressive disorder), recurrent episode, moderate (HCC) 09/23/2021   GAD (generalized anxiety disorder) 09/23/2021   Social anxiety disorder 09/23/2021   Primary parkinsonism (HCC) 01/02/2021   Cerebral vascular disease 08/21/2020   Hyperlipidemia 08/21/2020   Balance problem 08/21/2020   Benign paroxysmal positional vertigo 08/21/2020   Postural dizziness 08/21/2020   Tobacco abuse 06/02/2020   Morbid obesity (HCC) 05/15/2020   Current moderate episode of major depressive disorder without prior episode (HCC) 05/15/2020   Tremor 05/15/2020   Memory change 05/15/2020   Prediabetes  04/24/2020   Injury of right knee 04/24/2020   COVID-19 virus infection 04/24/2020   Diverticulitis of colon 08/13/2014   Ovarian cyst 08/13/2014    Past Surgical History:  Procedure Laterality Date   CERVIX REMOVAL     Laser surgery, not complete removal   CESAREAN SECTION     CHONDROPLASTY Right 06/11/2020   Procedure: CHONDROPLASTY;  Surgeon: Beverely Low, MD;  Location: [facility];  Service: Orthopedics;  Laterality: Right;   KNEE ARTHROSCOPY WITH MEDIAL MENISECTOMY Right 06/11/2020   Procedure: KNEE ARTHROSCOPY WITH MEDIAL MENISECTOMY;  Surgeon: Beverely Low, MD;  Location: [facility];  Service: Orthopedics;  Laterality: Right;    OB History     Gravida  4   Para  4   Term  4   Preterm      AB      Living  4      SAB      IAB      Ectopic      Multiple      Live Births  4            Home Medications    Prior to Admission medications   Medication Sig Start Date End Date Taking? Authorizing Provider  doxycycline (VIBRAMYCIN) 100 MG capsule Take 1 capsule (100 mg total) by mouth 2 (two) times daily. 11/30/21  Yes Carlisle Beers, FNP  aspirin EC 81 MG tablet  Take 81 mg by mouth daily. Swallow whole.    [provider]  atorvastatin (LIPITOR) 10 MG tablet TAKE 1 TABLET BY MOUTH EVERY DAY 07/14/21   Lynnda Child, MD  carbidopa-levodopa (SINEMET IR) 25-100 MG tablet Take 2 tablets by mouth 3 (three) times daily. 10/13/21 01/11/22  [provider]  clonazePAM (KLONOPIN) 0.5 MG tablet Take 0.5-1 tablets (0.25-0.5 mg total) by mouth as directed. Take half to 1 tablet 1-2 times a week for severe anxiety, agitation 09/23/21   Jomarie Longs, MD  DULoxetine (CYMBALTA) 20 MG capsule Take 1-2 capsules (20-40 mg total) by mouth as directed. Take 1 capsule daily for 1 week and increase to 1 capsule twice daily after that. 11/24/21   Jomarie Longs, MD  escitalopram (LEXAPRO) 5 MG tablet Take 1 tablet (5 mg total) by  mouth as directed. Take 1 tablet daily for 7 days and stop . 11/24/21   Jomarie Longs, MD  ibuprofen (ADVIL) 600 MG tablet Take 1 tablet (600 mg total) by mouth every 6 (six) hours as needed. 10/12/19   Lamptey, Britta Mccreedy, MD  metFORMIN (GLUCOPHAGE) 500 MG tablet Take 1 tablet by mouth 2 (two) times daily. 08/20/20   [provider]  propranolol (INDERAL) 10 MG tablet TAKE 1 TABLET (10 MG TOTAL) BY MOUTH 2 (TWO) TIMES DAILY AS NEEDED. FOR SEVERE ANXIETY 11/26/21   Jomarie Longs, MD  PROVENTIL HFA 108 (90 BASE) MCG/ACT inhaler Inhale 2 puffs into the lungs daily as needed for shortness of breath.  05/17/14 05/01/19  [provider]    Family History Family History  Problem Relation Age of Onset   Arthritis Mother    Hypertension Mother    COPD Mother    Heart failure Mother    Arthritis Father    Thyroid disease Father        thinks cancer   Parkinson's disease Father    Alcohol abuse Sister    Cancer Maternal Aunt        Female cancer - not sure what   Breast cancer Maternal Aunt    Prostate cancer Maternal Uncle    Colon cancer Maternal Uncle 45   Breast cancer Maternal Grandmother    Diabetes Paternal Grandmother    Parkinson's disease Paternal Grandmother    Diabetes Son     Social History Social History   Tobacco Use   Smoking status: Every Day    Packs/day: 0.25    Years: 30.00    Pack years: 7.50    Types: Cigarettes    Passive exposure: Past   Smokeless tobacco: Never  Substance Use Topics   Alcohol use: Not Currently    Alcohol/week: 0.0 standard drinks    Comment: once a year   Drug use: No     Allergies   Oxycodone-acetaminophen, Percocet [oxycodone-acetaminophen], and Pollen extract   Review of Systems Review of Systems Per HPI  Physical Exam Triage Vital Signs ED Triage Vitals  Enc Vitals Group     BP 11/30/21 1342 115/80     Pulse Rate 11/30/21 1342 74     Resp 11/30/21 1342 19     Temp 11/30/21 1342 97.9 F (36.6 C)      Temp Source 11/30/21 1342 Oral     SpO2 11/30/21 1342 95 %     Weight --      Height --      Head Circumference --      Peak Flow --      Pain  Score 11/30/21 1340 6     Pain Loc --      Pain Edu? --      Excl. in GC? --    No data found.  Updated Vital Signs BP 115/80   Pulse 74   Temp 97.9 F (36.6 C) (Oral)   Resp 19   SpO2 95%   Visual Acuity Right Eye Distance:   Left Eye Distance:   Bilateral Distance:    Right Eye Near:   Left Eye Near:    Bilateral Near:     Physical Exam Vitals and nursing note reviewed.  Constitutional:      General: She is not in acute distress.    Appearance: Normal appearance. She is well-developed.  HENT:     Head: Normocephalic and atraumatic.  Eyes:     Extraocular Movements: Extraocular movements intact.     Conjunctiva/sclera: Conjunctivae normal.  Cardiovascular:     Rate and Rhythm: Normal rate and regular rhythm.     Heart sounds: No murmur heard. Pulmonary:     Effort: Pulmonary effort is normal. No respiratory distress.     Breath sounds: Normal breath sounds.  Abdominal:     Palpations: Abdomen is soft.     Tenderness: There is no abdominal tenderness.  Musculoskeletal:        General: No swelling.     Cervical back: Neck supple.  Skin:    General: Skin is warm and dry.     Capillary Refill: Capillary refill takes less than 2 seconds.     Findings: Abscess present.     Comments: Abscess to anterior pubic area. Total diameter of fluctuant area is approximately 3cm and spherical. Area of most fluctuance is very tender to palpation. There is a surrounding area of cellulitis that is approximately 4 inches diameter.   Neurological:     General: No focal deficit present.     Mental Status: She is alert and oriented to person, place, and time. Mental status is at baseline.  Psychiatric:        Mood and Affect: Mood normal.        Behavior: Behavior normal.        Thought Content: Thought content normal.        Judgment:  Judgment normal.     UC Treatments / Results  Labs (all labs ordered are listed, but only abnormal results are displayed) Labs Reviewed - No data to display  EKG   Radiology No results found.  Procedures Incision and Drainage  Date/Time: 11/30/2021 3:41 PM Performed by: Carlisle Beers, FNP Authorized by: Carlisle Beers, FNP   Consent:    Consent obtained:  Verbal   Consent given by:  Patient   Risks, benefits, and alternatives were discussed: yes     Risks discussed:  Bleeding, damage to other organs, incomplete drainage, infection and pain   Alternatives discussed:  No treatment Universal protocol:    Patient identity confirmed:  Verbally with patient Location:    Type:  Abscess   Size:  3-4cm in diameter   Location:  Anogenital   Anogenital location: symphisis pubis. Pre-procedure details:    Skin preparation:  Povidone-iodine Anesthesia:    Anesthesia method:  Local infiltration   Local anesthetic:  Lidocaine 1% WITH epi Procedure type:    Complexity:  Simple Procedure details:    Incision types:  Stab incision   Incision depth:  Dermal   Drainage:  Bloody, purulent and serosanguinous   Drainage amount:  Scant   Wound treatment:  Wound left open   Packing materials:  None Post-procedure details:    Procedure completion:  Tolerated well, no immediate complications (including critical care time)  Medications Ordered in UC Medications - No data to display  Initial Impression / Assessment and Plan / UC Course  I have reviewed the triage vital signs and the nursing notes.  Pertinent labs & imaging results that were available during my care of the patient were reviewed by me and considered in my medical decision making (see chart for details).  Patient is a 61 year old female with a pubic abscess that is fluctuant and in the center of an approximately 3 to 4 inch area of erythema.  Abscess drained today successfully.  See incision and drainage  procedure note for more details.  Wound cleansed and dressed to urgent care.  [employer] CMA student present during abscess drainage procedure and initial assessment of abscess given location. Wound left open to continue to drain at home.  Patient instructed to change dressing with nonstick dressing and tape twice daily.  She is to cleanse the wound at home in the shower with soap and water. Plan to treat for infection with doxycycline twice daily for 7 days. She denies allergies to antibiotics. She may take tylenol/ibuprofen for ongoing pain management at home.   Counseled patient regarding appropriate use of medications and potential side effects for all medications recommended or prescribed today. Discussed red flag signs and symptoms of worsening condition,when to call the PCP office, return to urgent care, and when to seek higher level of care. Patient verbalizes understanding and agreement with plan. All questions answered. Patient discharged in stable condition.    Final Clinical Impressions(s) / UC Diagnoses   Final diagnoses:  Abscess     Discharge Instructions      You were seen today for your abscess to your pubic area. The area was drained today in the clinic and will continue to drain at home over the next few days now that there is a way for infection to get out. Please take the prescribed doxycycline antibiotic for the next 7 days twice daily with food. If you notice any worsening signs of infection such as worsening redness, swelling, increase in drainage, increased pain, or fever, please return to urgent care or your PCP for further evaluation.   If you develop any new or worsening symptoms or do not improve in the next 2 to 3 days, please return.  If your symptoms are severe, please go to the emergency room.  Follow-up with your primary care provider for further evaluation and management of your symptoms as well as ongoing wellness visits.  I hope you feel better!     ED  Prescriptions     Medication Sig Dispense Auth. Provider   doxycycline (VIBRAMYCIN) 100 MG capsule Take 1 capsule (100 mg total) by mouth 2 (two) times daily. 20 capsule Carlisle Beers, FNP      PDMP not reviewed this encounter.   Carlisle Beers, [place] 11/30/21 1547

## 2021-11-30 NOTE — ED Triage Notes (Signed)
Pt has abscess in vaginal area for about a week. Reports noticed that area was tender then over the weekend noticed really sore and got husband to look at area. Unsure if having drainage. Tried heat compresses yesterday.  ?

## 2021-12-22 ENCOUNTER — Other Ambulatory Visit: Payer: Self-pay | Admitting: Psychiatry

## 2021-12-22 DIAGNOSIS — F4010 Social phobia, unspecified: Secondary | ICD-10-CM

## 2021-12-22 DIAGNOSIS — F411 Generalized anxiety disorder: Secondary | ICD-10-CM

## 2021-12-23 ENCOUNTER — Ambulatory Visit (INDEPENDENT_AMBULATORY_CARE_PROVIDER_SITE_OTHER): Payer: Commercial Managed Care - HMO | Admitting: Psychiatry

## 2021-12-23 ENCOUNTER — Encounter: Payer: Self-pay | Admitting: Psychiatry

## 2021-12-23 VITALS — BP 115/76 | HR 69 | Temp 97.2°F | Wt 284.2 lb

## 2021-12-23 DIAGNOSIS — F411 Generalized anxiety disorder: Secondary | ICD-10-CM

## 2021-12-23 DIAGNOSIS — F331 Major depressive disorder, recurrent, moderate: Secondary | ICD-10-CM | POA: Diagnosis not present

## 2021-12-23 DIAGNOSIS — F17200 Nicotine dependence, unspecified, uncomplicated: Secondary | ICD-10-CM

## 2021-12-23 DIAGNOSIS — Z0289 Encounter for other administrative examinations: Secondary | ICD-10-CM

## 2021-12-23 DIAGNOSIS — F4010 Social phobia, unspecified: Secondary | ICD-10-CM | POA: Diagnosis not present

## 2021-12-23 MED ORDER — NICOTINE 14 MG/24HR TD PT24: 14.0000 mg | MEDICATED_PATCH | Freq: Every day | TRANSDERMAL | 0 refills | Status: DC

## 2021-12-23 MED ORDER — DULOXETINE HCL 30 MG PO CPEP: 30.0000 mg | ORAL_CAPSULE | Freq: Two times a day (BID) | ORAL | 0 refills | Status: DC

## 2021-12-23 MED ORDER — CLONAZEPAM 0.5 MG PO TABS: 0.2500 mg | ORAL_TABLET | ORAL | 1 refills | Status: DC

## 2021-12-23 NOTE — Patient Instructions (Signed)

## 2021-12-23 NOTE — Progress Notes (Unsigned)
[facility] MD OP Progress Note  12/23/2021 3:07 PM Kristina Savage  MRN:  [id number]  Chief Complaint:  Chief Complaint  Patient presents with   Follow-up: 61 year old Caucasian female with history of depression, anxiety, presented for medication management.   HPI: Kristina Savage is a 61 year old Caucasian female, married, unemployed, currently applying for disability, lives in [place], has a history of MDD, GAD, social anxiety disorder, parkinsonism, prediabetes, hyperlipidemia, history of left cerebellar stroke, currently on aspirin plus statin therapy, history of hypovitaminosis, history of obstructive sleep apnea on CPAP, presented for medication management.  Patient reports she continues to have anxiety, feeling on edge, although it is improving.  She reports she is tolerating the Cymbalta well.  Interested in dosage increase.  Reports sleep is good.  Denies any significant sadness, anhedonia or crying spells.  Patient continues to follow-up with her therapist.  Does not know how beneficial it is however is interested in staying in therapy.  Patient reports she is interested in smoking cessation, agreeable to trial of nicotine patch.  Denies any suicidality, homicidality or perceptual disturbances.  Patient denies any other concerns today.  Visit Diagnosis:    ICD-10-CM   1. MDD (major depressive disorder), recurrent episode, moderate (HCC)  F33.1 DULoxetine (CYMBALTA) 30 MG capsule    clonazePAM (KLONOPIN) 0.5 MG tablet    2. GAD (generalized anxiety disorder)  F41.1 DULoxetine (CYMBALTA) 30 MG capsule    clonazePAM (KLONOPIN) 0.5 MG tablet    3. Social anxiety disorder  F40.10 DULoxetine (CYMBALTA) 30 MG capsule    4. Tobacco use disorder  F17.200 nicotine (NICODERM CQ - DOSED IN MG/24 HOURS) 14 mg/24hr patch      Past Psychiatric History: Reviewed past psychiatric history from progress note on 09/01/2021.  Past trials of Lexapro, Prozac  Past Medical History:  Past Medical  History:  Diagnosis Date   Anxiety    Chicken pox    Depression    Diverticulitis    Dizziness    Genital warts    GERD (gastroesophageal reflux disease)    Hay fever    Hyperlipidemia    Ovarian cyst    Parkinson's disease ([place])    Dr.Shaw Loup [place] and at [facility].  Functional/neurological condition.   Pre-diabetes    Stroke [facility])    UTI (lower urinary tract infection)     Past Surgical History:  Procedure Laterality Date   CERVIX REMOVAL     Laser surgery, not complete removal   CESAREAN SECTION     CHONDROPLASTY Right 06/11/2020   Procedure: CHONDROPLASTY;  Surgeon: Netta Cedars, MD;  Location: [facility];  Service: Orthopedics;  Laterality: Right;   KNEE ARTHROSCOPY WITH MEDIAL MENISECTOMY Right 06/11/2020   Procedure: KNEE ARTHROSCOPY WITH MEDIAL MENISECTOMY;  Surgeon: Netta Cedars, MD;  Location: [facility];  Service: Orthopedics;  Laterality: Right;    Family Psychiatric History: Reviewed family psychiatric history from progress note on 09/01/2021.  Family History:  Family History  Problem Relation Age of Onset   Arthritis Mother    Hypertension Mother    COPD Mother    Heart failure Mother    Arthritis Father    Thyroid disease Father        thinks cancer   Parkinson's disease Father    Alcohol abuse Sister    Cancer Maternal Aunt        Female cancer - not sure what   Breast cancer Maternal Aunt    Prostate cancer Maternal Uncle  Colon cancer Maternal Uncle 45   Breast cancer Maternal Grandmother    Diabetes Paternal Grandmother    Parkinson's disease Paternal Grandmother    Diabetes Son     Social History: Reviewed social history from progress note on 09/01/2021. Social History   Socioeconomic History   Marital status: Married    Spouse name: Kristina Savage   Number of children: 4   Years of education: High school   Highest education level: Not on file  Occupational History   Occupation: CSR  Tobacco Use   Smoking  status: Every Day    Packs/day: 0.25    Years: 30.00    Pack years: 7.50    Types: Cigarettes    Passive exposure: Past   Smokeless tobacco: Never  Vaping Use   Vaping Use: Not on file  Substance and Sexual Activity   Alcohol use: Not Currently    Alcohol/week: 0.0 standard drinks    Comment: once a year   Drug use: No   Sexual activity: Yes    Birth control/protection: Post-menopausal  Other Topics Concern   Not on file  Social History Narrative   04/24/20   From: [place] - moved to [place] to raise her family   Living: with husband, Kristina Savage (1991)         Family: 7 children - including step children - twin boys nearby and daughter nearby - 10 grandchildren      Enjoys: spend time with family      Exercise: knee limiting exercise - tries to walk   Diet: eats whatever      Safety   Seat belts: Yes    Guns: Yes  and secure   Safe in relationships: Yes    Social Determinants of Health   Financial Resource Strain: Not on file  Food Insecurity: Not on file  Transportation Needs: Not on file  Physical Activity: Not on file  Stress: Not on file  Social Connections: Not on file    Allergies:  Allergies  Allergen Reactions   Oxycodone-Acetaminophen Nausea And Vomiting   Percocet [Oxycodone-Acetaminophen] Nausea And Vomiting   Pollen Extract     Nasal congestion    Metabolic Disorder Labs: Lab Results  Component Value Date   HGBA1C 6.2 05/15/2020   No results found for: PROLACTIN Lab Results  Component Value Date   CHOL 154 03/10/2021   TRIG 82.0 03/10/2021   HDL 63.90 03/10/2021   CHOLHDL 2 03/10/2021   VLDL 16.4 03/10/2021   LDLCALC 73 03/10/2021   LDLCALC 118 (H) 05/15/2020   Lab Results  Component Value Date   TSH 2.11 05/15/2020   TSH 1.26 08/07/2013    Therapeutic Level Labs: No results found for: LITHIUM No results found for: VALPROATE No components found for:  CBMZ  Current Medications: Current Outpatient Medications  Medication Sig  Dispense Refill   aspirin EC 81 MG tablet Take 81 mg by mouth daily. Swallow whole.     atorvastatin (LIPITOR) 10 MG tablet TAKE 1 TABLET BY MOUTH EVERY DAY 90 tablet 3   carbidopa-levodopa (SINEMET IR) 25-100 MG tablet Take 2 tablets by mouth 3 (three) times daily.     doxycycline (VIBRAMYCIN) 100 MG capsule Take 1 capsule (100 mg total) by mouth 2 (two) times daily. 20 capsule 0   DULoxetine (CYMBALTA) 30 MG capsule Take 1 capsule (30 mg total) by mouth 2 (two) times daily. 180 capsule 0   ibuprofen (ADVIL) 600 MG tablet Take 1 tablet (600 mg total) by mouth  every 6 (six) hours as needed. 30 tablet 0   metFORMIN (GLUCOPHAGE) 500 MG tablet Take 1 tablet by mouth 2 (two) times daily.     nicotine (NICODERM CQ - DOSED IN MG/24 HOURS) 14 mg/24hr patch Place 1 patch (14 mg total) onto the skin daily. 28 patch 0   propranolol (INDERAL) 10 MG tablet TAKE 1 TABLET (10 MG TOTAL) BY MOUTH 2 (TWO) TIMES DAILY AS NEEDED. FOR SEVERE ANXIETY 60 tablet 1   clonazePAM (KLONOPIN) 0.5 MG tablet Take 0.5-1 tablets (0.25-0.5 mg total) by mouth as directed. Take half to 1 tablet 1-2 times a week for severe anxiety, agitation 7 tablet 1   No current facility-administered medications for this visit.     Musculoskeletal: Strength & Muscle Tone: within normal limits Gait & Station: normal Patient leans: N/A  Psychiatric Specialty Exam: Review of Systems  Neurological:  Positive for tremors.  Psychiatric/Behavioral:  The patient is nervous/anxious.   All other systems reviewed and are negative.  Blood pressure 115/76, pulse 69, temperature (!) 97.2 F (36.2 C), temperature source Temporal, weight 284 lb 3.2 oz (128.9 kg).Body mass index is 48.78 kg/m.  General Appearance: Casual  Eye Contact:  Fair  Speech:  Clear and Coherent  Volume:  Normal  Mood:  Anxious  Affect:  Congruent  Thought Process:  Goal Directed and Descriptions of Associations: Intact  Orientation:  Full (Time, Place, and Person)   Thought Content: Logical   Suicidal Thoughts:  No  Homicidal Thoughts:  No  Memory:  Immediate;   Fair Recent;   Fair Remote;   Fair  Judgement:  Fair  Insight:  Fair  Psychomotor Activity:  Tremor  Concentration:  Concentration: Fair and Attention Span: Fair  Recall:  [employer] of Knowledge: Fair  Language: Fair  Akathisia:  No  Handed:  Right  AIMS (if indicated): not done  Assets:  Communication Skills Desire for Improvement Social Support  ADL's:  Intact  Cognition: WNL  Sleep:  Fair   Screenings: [place] Office Visit from 11/24/2021 in [place] Office Visit from 09/01/2021 in [place] Total Score 0 [place] Visit from 12/23/2021 in [place] Visit from 11/24/2021 in [place] Visit from 09/23/2021 in [place] Office Visit from 09/01/2021 in [place]  Total GAD-7 Score '6 8 16 11      '$ PHQ2-9    [place] Visit from 12/23/2021 in [place] Visit from 11/24/2021 in [place] Visit from 09/23/2021 in [place] Visit from 09/01/2021 in [place] from 12/29/2020 in [place] at [place]  PHQ-2 Total Score '2 2 5 4 1  '$ PHQ-9 Total Score '5 6 11 19 4      '$ [place] Office Visit from 12/23/2021 in [place] ED from 11/30/2021 in [facility] [place] from 09/23/2021 in [place] No Risk No Risk No Risk        Assessment and Plan: LYNSEE WANDS is a 61 year old Caucasian female with history of MDD, GAD, social anxiety disorder, concerns for Parkinson's  disease versus functional neurological symptom disorder, prediabetes, hyperlipidemia, history of left cerebellar stroke was evaluated in office today.  Patient is currently improving although she will continue to benefit from medication readjustment.  Plan MDD-in partial remission Cymbalta as prescribed.   Continue CBT  GAD-improving Increase Cymbalta to 30 mg p.o. twice daily Discontinue Lexapro-tapered off. Propranolol 10 mg p.o. twice daily as needed for anxiety attacks. Clonazepam 0.25 to 0.5 mg as needed for severe anxiety attacks only. Reviewed [place] PMP aware Continue CBT with Ms. Dimas Alexandria with [facility].  Social anxiety disorder-improving Continue CBT.  Tobacco use disorder-unstable Provided counseling for 5 minutes. Start nicotine replacement therapy-NicoDerm 14 mg per 24-hour patch daily.  Follow-up in clinic in 7 weeks or sooner if needed.  This note was generated in part or whole with voice recognition software. Voice recognition is usually quite accurate but there are transcription errors that can and very often do occur. I apologize for any typographical errors that were not detected and corrected.     Ursula Alert, MD 12/24/2021, 9:09 AM

## 2022-01-14 ENCOUNTER — Other Ambulatory Visit: Payer: Self-pay | Admitting: Psychiatry

## 2022-01-14 DIAGNOSIS — F4010 Social phobia, unspecified: Secondary | ICD-10-CM

## 2022-01-14 DIAGNOSIS — F411 Generalized anxiety disorder: Secondary | ICD-10-CM

## 2022-01-18 ENCOUNTER — Other Ambulatory Visit: Payer: Self-pay | Admitting: Psychiatry

## 2022-01-18 DIAGNOSIS — F4010 Social phobia, unspecified: Secondary | ICD-10-CM

## 2022-01-18 DIAGNOSIS — F331 Major depressive disorder, recurrent, moderate: Secondary | ICD-10-CM

## 2022-01-18 DIAGNOSIS — F411 Generalized anxiety disorder: Secondary | ICD-10-CM

## 2022-01-26 ENCOUNTER — Encounter: Payer: Self-pay | Admitting: Neurology

## 2022-02-02 ENCOUNTER — Ambulatory Visit (INDEPENDENT_AMBULATORY_CARE_PROVIDER_SITE_OTHER): Payer: Commercial Managed Care - HMO | Admitting: Family Medicine

## 2022-02-02 ENCOUNTER — Encounter (INDEPENDENT_AMBULATORY_CARE_PROVIDER_SITE_OTHER): Payer: Self-pay | Admitting: Family Medicine

## 2022-02-02 VITALS — BP 130/82 | HR 71 | Temp 97.9°F | Ht 64.0 in | Wt 282.0 lb

## 2022-02-02 DIAGNOSIS — R0602 Shortness of breath: Secondary | ICD-10-CM | POA: Diagnosis not present

## 2022-02-02 DIAGNOSIS — R5383 Other fatigue: Secondary | ICD-10-CM | POA: Diagnosis not present

## 2022-02-02 DIAGNOSIS — R7303 Prediabetes: Secondary | ICD-10-CM

## 2022-02-02 DIAGNOSIS — Z1331 Encounter for screening for depression: Secondary | ICD-10-CM

## 2022-02-02 DIAGNOSIS — F39 Unspecified mood [affective] disorder: Secondary | ICD-10-CM | POA: Diagnosis not present

## 2022-02-02 DIAGNOSIS — E7849 Other hyperlipidemia: Secondary | ICD-10-CM

## 2022-02-02 DIAGNOSIS — G4733 Obstructive sleep apnea (adult) (pediatric): Secondary | ICD-10-CM

## 2022-02-02 DIAGNOSIS — E559 Vitamin D deficiency, unspecified: Secondary | ICD-10-CM

## 2022-02-02 DIAGNOSIS — Z6841 Body Mass Index (BMI) 40.0 and over, adult: Secondary | ICD-10-CM

## 2022-02-02 DIAGNOSIS — Z9989 Dependence on other enabling machines and devices: Secondary | ICD-10-CM

## 2022-02-02 DIAGNOSIS — Z9189 Other specified personal risk factors, not elsewhere classified: Secondary | ICD-10-CM

## 2022-02-03 ENCOUNTER — Encounter: Payer: Self-pay | Admitting: Psychiatry

## 2022-02-03 ENCOUNTER — Ambulatory Visit (INDEPENDENT_AMBULATORY_CARE_PROVIDER_SITE_OTHER): Payer: Commercial Managed Care - HMO | Admitting: Psychiatry

## 2022-02-03 VITALS — BP 128/79 | HR 80 | Temp 98.7°F | Wt 173.8 lb

## 2022-02-03 DIAGNOSIS — F411 Generalized anxiety disorder: Secondary | ICD-10-CM

## 2022-02-03 DIAGNOSIS — F4010 Social phobia, unspecified: Secondary | ICD-10-CM

## 2022-02-03 DIAGNOSIS — F17200 Nicotine dependence, unspecified, uncomplicated: Secondary | ICD-10-CM | POA: Diagnosis not present

## 2022-02-03 DIAGNOSIS — F331 Major depressive disorder, recurrent, moderate: Secondary | ICD-10-CM

## 2022-02-03 LAB — CBC WITH DIFFERENTIAL/PLATELET
Basophils Absolute: 0 10*3/uL (ref 0.0–0.2)
Basos: 1 %
EOS (ABSOLUTE): 0.3 10*3/uL (ref 0.0–0.4)
Eos: 5 %
Hematocrit: 42.1 % (ref 34.0–46.6)
Hemoglobin: 13.6 g/dL (ref 11.1–15.9)
Immature Grans (Abs): 0 10*3/uL (ref 0.0–0.1)
Immature Granulocytes: 0 %
Lymphocytes Absolute: 1.7 10*3/uL (ref 0.7–3.1)
Lymphs: 31 %
MCH: 27.3 pg (ref 26.6–33.0)
MCHC: 32.3 g/dL (ref 31.5–35.7)
MCV: 85 fL (ref 79–97)
Monocytes Absolute: 0.4 10*3/uL (ref 0.1–0.9)
Monocytes: 8 %
Neutrophils Absolute: 3.1 10*3/uL (ref 1.4–7.0)
Neutrophils: 55 %
Platelets: 234 10*3/uL (ref 150–450)
RBC: 4.98 x10E6/uL (ref 3.77–5.28)
RDW: 13.3 % (ref 11.7–15.4)
WBC: 5.5 10*3/uL (ref 3.4–10.8)

## 2022-02-03 LAB — COMPREHENSIVE METABOLIC PANEL
ALT: 31 IU/L (ref 0–32)
AST: 21 IU/L (ref 0–40)
Albumin/Globulin Ratio: 1.9 (ref 1.2–2.2)
Albumin: 4.6 g/dL (ref 3.8–4.9)
Alkaline Phosphatase: 93 IU/L (ref 44–121)
BUN/Creatinine Ratio: 23 (ref 9–23)
BUN: 16 mg/dL (ref 6–24)
Bilirubin Total: 0.5 mg/dL (ref 0.0–1.2)
CO2: 23 mmol/L (ref 20–29)
Calcium: 9.6 mg/dL (ref 8.7–10.2)
Chloride: 104 mmol/L (ref 96–106)
Creatinine, Ser: 0.7 mg/dL (ref 0.57–1.00)
Globulin, Total: 2.4 g/dL (ref 1.5–4.5)
Glucose: 95 mg/dL (ref 70–99)
Potassium: 4.9 mmol/L (ref 3.5–5.2)
Sodium: 144 mmol/L (ref 134–144)
Total Protein: 7 g/dL (ref 6.0–8.5)
eGFR: 100 mL/min/{1.73_m2} (ref >59)

## 2022-02-03 LAB — FOLATE: Folate: 7.4 ng/mL (ref >3.0)

## 2022-02-03 LAB — VITAMIN B12: Vitamin B-12: 430 pg/mL (ref 232–1245)

## 2022-02-03 LAB — HEMOGLOBIN A1C
Est. average glucose Bld gHb Est-mCnc: 128 mg/dL
Hgb A1c MFr Bld: 6.1 % — ABNORMAL HIGH (ref 4.8–5.6)

## 2022-02-03 LAB — LIPID PANEL WITH LDL/HDL RATIO
Cholesterol, Total: 141 mg/dL (ref 100–199)
HDL: 58 mg/dL (ref >39)
LDL Chol Calc (NIH): 69 mg/dL (ref 0–99)
LDL/HDL Ratio: 1.2 ratio (ref 0.0–3.2)
Triglycerides: 67 mg/dL (ref 0–149)
VLDL Cholesterol Cal: 14 mg/dL (ref 5–40)

## 2022-02-03 LAB — T4, FREE: Free T4: 0.93 ng/dL (ref 0.82–1.77)

## 2022-02-03 LAB — TSH: TSH: 2.03 u[IU]/mL (ref 0.450–4.500)

## 2022-02-03 LAB — INSULIN, RANDOM: INSULIN: 21.2 u[IU]/mL (ref 2.6–24.9)

## 2022-02-03 LAB — VITAMIN D 25 HYDROXY (VIT D DEFICIENCY, FRACTURES): Vit D, 25-Hydroxy: 34.5 ng/mL (ref 30.0–100.0)

## 2022-02-03 MED ORDER — PROPRANOLOL HCL 10 MG PO TABS: 10.0000 mg | ORAL_TABLET | Freq: Three times a day (TID) | ORAL | 1 refills | Status: DC | PRN
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
## 2022-02-06 NOTE — Progress Notes (Signed)
Chief Complaint:   OBESITY Kristina Savage (MR# [id number]) is a 61 y.o. female who presents for evaluation and treatment of obesity and related comorbidities. Current BMI is Body mass index is 48.41 kg/m. Kristina Savage has been struggling with her weight for many years and has been unsuccessful in either losing weight, maintaining weight loss, or reaching her healthy weight goal.  Kristina Savage is currently in the action stage of change and ready to dedicate time achieving and maintaining a healthier weight. Kristina Savage is interested in becoming our patient and working on intensive lifestyle modifications including (but not limited to) diet and exercise for weight loss.  Kristina Savage lives with her husband, Kristina Savage, and watches her grandchildren a couple of hours a week. With her Parkinson's disease, pt's activity is limited but she swims in her pool for exercise or uses stationary bike.  Kristina Savage's habits were reviewed today and are as follows: Her family eats meals together, she thinks her family will eat healthier with her, her desired weight loss is 132 lbs, she has been heavy most of her life, she started gaining weight after having kids, her heaviest weight ever was now at 282 pounds, she has significant food cravings issues, she snacks frequently in the evenings, she is frequently drinking liquids with calories, she frequently makes poor food choices, she has problems with excessive hunger, she frequently eats larger portions than normal, she has binge eating behaviors, and she struggles with emotional eating.  Depression Screen Kristina Savage's Food and Mood (modified PHQ-9) score was 15.     02/03/2022   10:14 AM  Depression screen PHQ 2/9  Decreased Interest   Down, Depressed, Hopeless   PHQ - 2 Score   Altered sleeping   Tired, decreased energy   Change in appetite   Feeling bad or failure about yourself    Trouble concentrating   Moving slowly or fidgety/restless   Suicidal thoughts   PHQ-9 Score   Difficult  doing work/chores      Information is confidential and restricted. Go to Review Flowsheets to unlock data.   Subjective:   1. Other fatigue Kristina Savage admits to daytime somnolence and admits to waking up still tired. Patient has a history of symptoms of daytime fatigue and morning fatigue. Kristina Savage generally gets  8-10  hours of sleep per night, and states that she has generally restful sleep. Snoring is present. Apneic episodes are not present. Epworth Sleepiness Score is 5.   2. SOB (shortness of breath) on exertion Kristina Savage notes increasing shortness of breath with exercising and seems to be worsening over time with weight gain. She notes getting out of breath sooner with activity than she used to. This has gotten worse recently. Kristina Savage denies shortness of breath at rest or orthopnea.  3. Mood disorder (Kristina Savage)- emotional eating Pt with generalized anxiety disorder and depression. Her PHQ9 score is 15, but she feels she still needs better control of depression. Pt sees psychiatrist every 6 weeks and a counselor weekly. 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 Sees Dr. Brigitte Pulse of neurology and Dr. Gar Gibbon at [facility]  4. Other hyperlipidemia Kristina Savage has been on Lipitor for several years now.  5. Prediabetes She was first diagnosed 8-10 years ago. Pt did not tolerate Metformin well due to nausea, therefore she has not taken it in over 6 months now.  6. OSA on CPAP Diagnosed a year ago. ESS score is 5 and symptoms are much better, per pt.  7. Vitamin D deficiency Pt  is on OTC Vit D 1000 IU once weekly.  8. At risk of diabetes mellitus Kristina Savage is at risk for diabetes mellitus due to prediabetes.  Assessment/Plan:   Orders Placed This Encounter  Procedures   VITAMIN D 25 Hydroxy (Vit-D Deficiency, Fractures)   TSH   T4, free   Lipid Panel With LDL/HDL Ratio   Insulin, random   Hemoglobin A1c   Folate   Comprehensive metabolic panel   CBC with Differential/Platelet   Vitamin B12    There are no  discontinued medications.   No orders of the defined types were placed in this encounter.    1. Other fatigue Kristina Savage does feel that her weight is causing her energy to be lower than it should be. Fatigue may be related to obesity, depression or many other causes. Labs will be ordered, and in the meanwhile, Kristina Savage will focus on self care including making healthy food choices, increasing physical activity and focusing on stress reduction. Obtain fasting labs today.  - TSH - T4, free - Folate - Comprehensive metabolic panel - CBC with Differential/Platelet - Vitamin B12  2. SOB (shortness of breath) on exertion Kristina Savage does feel that she gets out of breath more easily that she used to when she exercises. Kristina Savage's shortness of breath appears to be obesity related and exercise induced. She has agreed to work on weight loss and gradually increase exercise to treat her exercise induced shortness of breath. Will continue to monitor closely.  3. Mood disorder (Kristina Savage)- emotional eating Behavior modification techniques were discussed today to help Kristina Savage deal with her emotional/non-hunger eating behaviors.  Orders and follow up as documented in patient record.  Treatment per psychiatrist. Continue counseling. Pt with emotional eating, which she will discuss with her counselor first, and if it does not help, we will refer to Dr. Mallie Mussel in the future. Obtain fasting labs today.  - Folate - Comprehensive metabolic panel - Vitamin B76  4. Other hyperlipidemia Cardiovascular risk and specific lipid/LDL goals reviewed.  We discussed several lifestyle modifications today and Kristina Savage will continue to work on diet, exercise and weight loss efforts. Orders and follow up as documented in patient record.   Counseling Intensive lifestyle modifications are the first line treatment for this issue. Dietary changes: Increase soluble fiber. Decrease simple carbohydrates. Exercise changes: Moderate to vigorous-intensity  aerobic activity 150 minutes per week if tolerated. Lipid-lowering medications: see documented in medical record. Obtain fasting labs today.  - Lipid Panel With LDL/HDL Ratio - Comprehensive metabolic panel  5. Prediabetes Brienna will continue to work on weight loss, exercise, and decreasing simple carbohydrates to help decrease the risk of diabetes. Obtain fasting labs today.  - Insulin, random - Hemoglobin A1c - Comprehensive metabolic panel  6. OSA on CPAP Intensive lifestyle modifications are the first line treatment for this issue. We discussed several lifestyle modifications today and she will continue to work on diet, exercise and weight loss efforts. We will continue to monitor. Orders and follow up as documented in patient record. Continue treatment per sleep medicine doctor.  7. Vitamin D deficiency Low Vitamin D level contributes to fatigue and are associated with obesity, breast, and colon cancer. She agrees to continue to take OTC Vitamin D1,000 IU every week and will follow-up for routine testing of Vitamin D, at least 2-3 times per year to avoid over-replacement. Obtain fasting labs today.  - VITAMIN D 25 Hydroxy (Vit-D Deficiency, Fractures)  8. Depression screen Kristina Savage had a positive depression screening. Depression is commonly  associated with obesity and often results in emotional eating behaviors. We will monitor this closely and work on CBT to help improve the non-hunger eating patterns. Referral to Psychology may be required if no improvement is seen as she continues in our clinic.  9. At risk of diabetes mellitus - Kristina Savage was given diabetes prevention education and counseling today of more than 29 minutes.  - Counseled patient on pathophysiology of disease and meaning/ implication of lab results.  - Reviewed how certain foods can either stimulate or inhibit insulin release, and subsequently affect hunger pathways  - Importance of following a healthy meal plan with  limiting amounts of simple carbohydrates discussed with patient - Effects of regular aerobic exercise on blood sugar regulation reviewed and encouraged an eventual goal of 30 min 5d/week or more as a minimum.  - Briefly discussed treatment options, which always include dietary and lifestyle modification as first line.   - Handouts provided at patient's desire and/or told to go online to the American Diabetes Association website for further information.  10. Class 3 severe obesity with serious comorbidity and body mass index (BMI) of 45.0 to 49.9 in adult, unspecified obesity type (HCC) Kristina Savage is currently in the action stage of change and her goal is to continue with weight loss efforts. I recommend Kristina Savage begin the structured treatment plan as follows:  She has agreed to the Category 2 Plan.  Exercise goals:  As is    Behavioral modification strategies: increasing lean protein intake, decreasing simple carbohydrates, and no skipping meals.  She was informed of the importance of frequent follow-up visits to maximize her success with intensive lifestyle modifications for her multiple health conditions. She was informed we would discuss her lab results at her next visit unless there is a critical issue that needs to be addressed sooner. Kharis agreed to keep her next visit at the agreed upon time to discuss these results.  Objective:   Blood pressure 130/82, pulse 71, temperature 97.9 F (36.6 C), height '5\' 4"'$  (1.626 m), weight 282 lb (127.9 kg), SpO2 96 %. Body mass index is 48.41 kg/m.  EKG: Normal sinus rhythm, rate 62 (done 11/02/2021).  Indirect Calorimeter completed today shows a VO2 of 299 and a REE of 2059.  Her calculated basal metabolic rate is 1027 thus her basal metabolic rate is better than expected.  General: Cooperative, alert, well developed, in no acute distress. HEENT: Conjunctivae and lids unremarkable. Cardiovascular: Regular rhythm.  Lungs: Normal work of  breathing. Neurologic: No focal deficits.   Lab Results  Component Value Date   CREATININE 0.70 02/02/2022   BUN 16 02/02/2022   NA 144 02/02/2022   K 4.9 02/02/2022   CL 104 02/02/2022   CO2 23 02/02/2022   Lab Results  Component Value Date   ALT 31 02/02/2022   AST 21 02/02/2022   ALKPHOS 93 02/02/2022   BILITOT 0.5 02/02/2022   Lab Results  Component Value Date   HGBA1C 6.1 (H) 02/02/2022   HGBA1C 6.2 05/15/2020   HGBA1C 5.8 08/07/2013   Lab Results  Component Value Date   INSULIN 21.2 02/02/2022   Lab Results  Component Value Date   TSH 2.030 02/02/2022   Lab Results  Component Value Date   CHOL 141 02/02/2022   HDL 58 02/02/2022   LDLCALC 69 02/02/2022   TRIG 67 02/02/2022   CHOLHDL 2 03/10/2021   Lab Results  Component Value Date   WBC 5.5 02/02/2022   HGB 13.6 02/02/2022  HCT 42.1 02/02/2022   MCV 85 02/02/2022   PLT 234 02/02/2022    Attestation Statements:   Reviewed by clinician on day of visit: allergies, medications, problem list, medical history, surgical history, family history, social history, and previous encounter notes.  I, Kathlene November, BS, CMA, am acting as transcriptionist for [employer], DO.   I have reviewed the above documentation for accuracy and completeness, and I agree with the above. Marjory Sneddon, D.O.  The [place] was signed into law in 2016 which includes the topic of electronic health records.  This provides immediate access to information in MyChart.  This includes consultation notes, operative notes, office notes, lab results and pathology reports.  If you have any questions about what you read please let us know at your next visit so we can discuss your concerns and take corrective action if need be.  We are right here with you.

## 2022-02-09 ENCOUNTER — Ambulatory Visit (INDEPENDENT_AMBULATORY_CARE_PROVIDER_SITE_OTHER): Payer: Managed Care, Other (non HMO) | Admitting: Family Medicine

## 2022-02-16 ENCOUNTER — Encounter (INDEPENDENT_AMBULATORY_CARE_PROVIDER_SITE_OTHER): Payer: Self-pay | Admitting: Family Medicine

## 2022-02-16 ENCOUNTER — Ambulatory Visit (INDEPENDENT_AMBULATORY_CARE_PROVIDER_SITE_OTHER): Payer: Commercial Managed Care - HMO | Admitting: Family Medicine

## 2022-02-16 VITALS — BP 107/73 | HR 72 | Temp 97.5°F | Ht 64.0 in | Wt 272.0 lb

## 2022-02-16 DIAGNOSIS — E538 Deficiency of other specified B group vitamins: Secondary | ICD-10-CM

## 2022-02-16 DIAGNOSIS — R7303 Prediabetes: Secondary | ICD-10-CM

## 2022-02-16 DIAGNOSIS — E559 Vitamin D deficiency, unspecified: Secondary | ICD-10-CM | POA: Diagnosis not present

## 2022-02-16 DIAGNOSIS — Z9189 Other specified personal risk factors, not elsewhere classified: Secondary | ICD-10-CM

## 2022-02-16 DIAGNOSIS — Z6841 Body Mass Index (BMI) 40.0 and over, adult: Secondary | ICD-10-CM

## 2022-02-16 DIAGNOSIS — E7849 Other hyperlipidemia: Secondary | ICD-10-CM

## 2022-02-16 DIAGNOSIS — E669 Obesity, unspecified: Secondary | ICD-10-CM

## 2022-02-16 MED ORDER — CYANOCOBALAMIN 500 MCG PO TABS: ORAL_TABLET | ORAL | Status: DC

## 2022-02-16 MED ORDER — VITAMIN D (ERGOCALCIFEROL) 1.25 MG (50000 UNIT) PO CAPS: 50000.0000 [IU] | ORAL_CAPSULE | ORAL | 0 refills | Status: DC

## 2022-02-17 ENCOUNTER — Other Ambulatory Visit: Payer: Self-pay | Admitting: Psychiatry

## 2022-02-17 DIAGNOSIS — F4010 Social phobia, unspecified: Secondary | ICD-10-CM

## 2022-02-17 DIAGNOSIS — F411 Generalized anxiety disorder: Secondary | ICD-10-CM

## 2022-02-20 NOTE — Progress Notes (Signed)
Chief Complaint:   OBESITY Kristina Savage is here to discuss her progress with her obesity treatment plan along with follow-up of her obesity related diagnoses. Kristina Savage is on the Category 2 Plan and states she is following her eating plan approximately 70% of the time. Kristina Savage states she is water aerobics 30 minutes 7 times per week.  Today's visit was #: 2 Starting weight: 282 lbs Starting date: 02/02/2022 Today's weight: 272 lbs Today's date: 02/16/2022 Total lbs lost to date: 10 Total lbs lost since last in-office visit: 10  Interim History: Kristina Savage is here today for her first follow-up office visit since starting the program with [place].  All blood work/ lab tests that were recently ordered by myself or an outside provider were reviewed with patient today per their request.   Extended time was spent counseling her on all new disease processes that were discovered or preexisting ones that are affected by BMI.  she understands that many of these abnormalities will need to monitored regularly along with the current treatment plan of prudent dietary changes, in which we are making each and every office visit, to improve these health parameters. Kristina Savage followed meal plan to the tee but couldn't eat all the proteins or foods at times. She only drank 32 oz of water ad day on average.  Subjective:   1. Other hyperlipidemia Kristina Savage is tolerating medication(s) well without side effects.  Medication compliance is good as patient endorses taking it as prescribed.  The patient denies additional concerns regarding this condition. Medication: Lipitor 10 mg  2. Prediabetes Pt reports some carb cravings and is not on medication.  3. Vitamin D deficiency Discussed labs with patient today. Not at goal at 34.5. pt is taking OTC Vit D 1,000 IU daily.  4. B12 deficiency Kristina Savage has history of taking B12 injections over a year ago.  5. At risk of diabetes mellitus Kristina Savage is at higher than average risk for  developing diabetes due to her obesity.  Assessment/Plan:  No orders of the defined types were placed in this encounter.   Medications Discontinued During This Encounter  Medication Reason   Cholecalciferol (VITAMIN D3) 25 MCG (1000 UT) CAPS      Meds ordered this encounter  Medications   cyanocobalamin (CYANOCOBALAMIN) 500 MCG tablet    Sig: 200-586mg QD   Vitamin D, Ergocalciferol, (DRISDOL) 1.25 MG (50000 UNIT) CAPS capsule    Sig: Take 1 capsule (50,000 Units total) by mouth every 7 (seven) days.    Dispense:  4 capsule    Refill:  0    30 d supply;  ** OV for RF **   Do not send RF request     1. Other hyperlipidemia At goal. Cardiovascular risk and specific lipid/LDL goals reviewed.  We discussed several lifestyle modifications today and Kristina Savage continue to work on diet, exercise and weight loss efforts. Orders and follow up as documented in patient record. Continue Lipitor per PCP and follow prudent nutritional plan.  Counseling Intensive lifestyle modifications are the first line treatment for this issue. Dietary changes: Increase soluble fiber. Decrease simple carbohydrates. Exercise changes: Moderate to vigorous-intensity aerobic activity 150 minutes per week if tolerated. Lipid-lowering medications: see documented in medical record.  2. Prediabetes Kristina Savage A1c is 6.1 with an insulin level of 21.2 and not at goal. Kristina Savage continue to work on weight loss, exercise, and decreasing simple carbohydrates to help decrease the risk of diabetes. Handouts given and condition discussed  with pt. Continue weight loss.  3. Vitamin D deficiency Discontinue OTC Vit D and start Ergocalciferol 50,000 IU weekly. - I discussed the importance of vitamin D to the patient's health and well-being.  - I reviewed possible symptoms of low Vitamin D:  low energy, depressed mood, muscle aches, joint aches, osteoporosis etc. was reviewed with patient - low Vitamin D levels may be linked to  an increased risk of cardiovascular events and even increased risk of cancers- such as colon and breast.  - ideal vitamin D levels reviewed with patient  - I recommend pt take a 50,000 IU weekly prescription vit D - see script below   - Informed patient this may be a lifelong thing, and she was encouraged to continue to take the medicine until told otherwise.    - weight loss will likely improve availability of vitamin D, thus encouraged Kristina Savage to continue with meal plan and their weight loss efforts to further improve this condition.  Thus, we will need to monitor levels regularly (every 3-4 mo on average) to keep levels within normal limits and prevent over supplementation. - pt's questions and concerns regarding this condition addressed.  Start- Vitamin D, Ergocalciferol, (DRISDOL) 1.25 MG (50000 UNIT) CAPS capsule; Take 1 capsule (50,000 Units total) by mouth every 7 (seven) days.  Dispense: 4 capsule; Refill: 0  4. B12 deficiency The diagnosis was reviewed with the patient. Counseling provided today, see below. We will continue to monitor. Orders and follow up as documented in patient record. Start 200-500 mcg B12 daily, either in a multivitamin or alone. Recheck in 3 months.  Counseling The body needs vitamin B12: to make red blood cells; to make DNA; and to help the nerves work properly so they can carry messages from the brain to the body.  The main causes of vitamin B12 deficiency include dietary deficiency, digestive diseases, pernicious anemia, and having a surgery in which part of the stomach or small intestine is removed.  Certain medicines can make it harder for the body to absorb vitamin B12. These medicines include: heartburn medications; some antibiotics; some medications used to treat diabetes, gout, and high cholesterol.  In some cases, there are no symptoms of this condition. If the condition leads to anemia or nerve damage, various symptoms can occur, such as weakness or fatigue,  shortness of breath, and numbness or tingling in your hands and feet.   Treatment:  May include taking vitamin B12 supplements.  Avoid alcohol.  Eat lots of healthy foods that contain vitamin B12: Beef, pork, chicken, [place], and organ meats, such as liver.  Seafood: This includes clams, rainbow trout, salmon, tuna, and haddock. Eggs.  Cereal and dairy products that are fortified: This means that vitamin B12 has been added to the food.   Start- cyanocobalamin (CYANOCOBALAMIN) 500 MCG tablet; 200-500 mcg QD  5. At risk of diabetes mellitus - Kristina Savage was given diabetes prevention education and counseling today of more than 25 minutes.  - Counseled patient on pathophysiology of disease and meaning/ implication of lab results.  - Reviewed how certain foods can either stimulate or inhibit insulin release, and subsequently affect hunger pathways  - Importance of following a healthy meal plan with limiting amounts of simple carbohydrates discussed with patient - Effects of regular aerobic exercise on blood sugar regulation reviewed and encouraged an eventual goal of 30 min 5d/week or more as a minimum.  - Briefly discussed treatment options, which always include dietary and lifestyle modification as first line.   -  Handouts provided at patient's desire and/or told to go online to the American Diabetes Association website for further information.  6. Obesity, current BMI 46.8 Kristina Savage is currently in the action stage of change. As such, her goal is to continue with weight loss efforts. She has agreed to the Category 2 Plan.   Exercise goals:  As is  Behavioral modification strategies: increasing lean protein intake, decreasing simple carbohydrates, no skipping meals, and planning for success.  Kristina Savage has agreed to follow-up with our clinic in 2 weeks. She was informed of the importance of frequent follow-up visits to maximize her success with intensive lifestyle modifications for her multiple health  conditions.   Objective:   Blood pressure 107/73, pulse 72, temperature (!) 97.5 F (36.4 C), height '5\' 4"'$  (1.626 m), weight 272 lb (123.4 kg), SpO2 96 %. Body mass index is 46.69 kg/m.  General: Cooperative, alert, well developed, in no acute distress. HEENT: Conjunctivae and lids unremarkable. Cardiovascular: Regular rhythm.  Lungs: Normal work of breathing. Neurologic: No focal deficits.   Lab Results  Component Value Date   CREATININE 0.70 02/02/2022   BUN 16 02/02/2022   NA 144 02/02/2022   K 4.9 02/02/2022   CL 104 02/02/2022   CO2 23 02/02/2022   Lab Results  Component Value Date   ALT 31 02/02/2022   AST 21 02/02/2022   ALKPHOS 93 02/02/2022   BILITOT 0.5 02/02/2022   Lab Results  Component Value Date   HGBA1C 6.1 (H) 02/02/2022   HGBA1C 6.2 05/15/2020   HGBA1C 5.8 08/07/2013   Lab Results  Component Value Date   INSULIN 21.2 02/02/2022   Lab Results  Component Value Date   TSH 2.030 02/02/2022   Lab Results  Component Value Date   CHOL 141 02/02/2022   HDL 58 02/02/2022   LDLCALC 69 02/02/2022   TRIG 67 02/02/2022   CHOLHDL 2 03/10/2021   Lab Results  Component Value Date   VD25OH 34.5 02/02/2022   Lab Results  Component Value Date   WBC 5.5 02/02/2022   HGB 13.6 02/02/2022   HCT 42.1 02/02/2022   MCV 85 02/02/2022   PLT 234 02/02/2022    Attestation Statements:   Reviewed by clinician on day of visit: allergies, medications, problem list, medical history, surgical history, family history, social history, and previous encounter notes.  I, Kathlene November, BS, CMA, am acting as transcriptionist for [employer], DO.  I have reviewed the above documentation for accuracy and completeness, and I agree with the above. Kristina Savage, D.O.  The [place] was signed into law in 2016 which includes the topic of electronic health records.  This provides immediate access to information in MyChart.  This includes consultation  notes, operative notes, office notes, lab results and pathology reports.  If you have any questions about what you read please let us know at your next visit so we can discuss your concerns and take corrective action if need be.  We are right here with you.

## 2022-02-23 ENCOUNTER — Ambulatory Visit (INDEPENDENT_AMBULATORY_CARE_PROVIDER_SITE_OTHER): Payer: Managed Care, Other (non HMO) | Admitting: Family Medicine

## 2022-03-03 ENCOUNTER — Encounter (INDEPENDENT_AMBULATORY_CARE_PROVIDER_SITE_OTHER): Payer: Self-pay

## 2022-03-08 ENCOUNTER — Ambulatory Visit (INDEPENDENT_AMBULATORY_CARE_PROVIDER_SITE_OTHER): Payer: Commercial Managed Care - HMO | Admitting: Adult Health

## 2022-03-08 ENCOUNTER — Encounter (INDEPENDENT_AMBULATORY_CARE_PROVIDER_SITE_OTHER): Payer: Self-pay | Admitting: Adult Health

## 2022-03-08 VITALS — BP 130/80 | HR 65 | Temp 97.2°F | Ht 64.0 in | Wt 264.0 lb

## 2022-03-08 DIAGNOSIS — E559 Vitamin D deficiency, unspecified: Secondary | ICD-10-CM

## 2022-03-08 DIAGNOSIS — E669 Obesity, unspecified: Secondary | ICD-10-CM | POA: Diagnosis not present

## 2022-03-08 DIAGNOSIS — R251 Tremor, unspecified: Secondary | ICD-10-CM

## 2022-03-08 DIAGNOSIS — Z9189 Other specified personal risk factors, not elsewhere classified: Secondary | ICD-10-CM

## 2022-03-08 DIAGNOSIS — Z6841 Body Mass Index (BMI) 40.0 and over, adult: Secondary | ICD-10-CM

## 2022-03-08 DIAGNOSIS — F411 Generalized anxiety disorder: Secondary | ICD-10-CM | POA: Diagnosis not present

## 2022-03-08 MED ORDER — VITAMIN D (ERGOCALCIFEROL) 1.25 MG (50000 UNIT) PO CAPS: 50000.0000 [IU] | ORAL_CAPSULE | ORAL | 0 refills | Status: DC

## 2022-03-09 ENCOUNTER — Telehealth: Payer: Self-pay

## 2022-03-09 NOTE — Telephone Encounter (Signed)
Please let patient know if her pain does not get better to contact her primary care provider for further evaluation.  Please contact her and advise her to make a sooner appointment if she needs medication changes.

## 2022-03-09 NOTE — Telephone Encounter (Signed)
pt left a message that she stopped the duloxetine because she was having pain rt ribcage.

## 2022-03-10 NOTE — Telephone Encounter (Signed)
spoke with patient she states that her pain has stopped sinces she stopped the duloxetine.  she was advised that if pain started back to contact her pcp and also call and see if you can see her sooner for medication changes. she agreed.

## 2022-03-13 ENCOUNTER — Other Ambulatory Visit: Payer: Self-pay

## 2022-03-13 ENCOUNTER — Ambulatory Visit: Payer: Self-pay

## 2022-03-13 ENCOUNTER — Emergency Department: Payer: Commercial Managed Care - HMO

## 2022-03-13 ENCOUNTER — Emergency Department
Admission: EM | Admit: 2022-03-13 | Discharge: 2022-03-13 | Disposition: A | Discharge status: Home / Self Care | Payer: Commercial Managed Care - HMO | Attending: Emergency Medicine | Admitting: Emergency Medicine

## 2022-03-13 ENCOUNTER — Encounter: Payer: Self-pay | Admitting: Emergency Medicine

## 2022-03-13 DIAGNOSIS — R1011 Right upper quadrant pain: Secondary | ICD-10-CM | POA: Diagnosis present

## 2022-03-13 DIAGNOSIS — G20 Parkinson's disease: Secondary | ICD-10-CM | POA: Diagnosis not present

## 2022-03-13 DIAGNOSIS — R059 Cough, unspecified: Secondary | ICD-10-CM | POA: Diagnosis not present

## 2022-03-13 LAB — COMPREHENSIVE METABOLIC PANEL
ALT: 5 U/L (ref 0–44)
AST: 17 U/L (ref 15–41)
Albumin: 4.2 g/dL (ref 3.5–5.0)
Alkaline Phosphatase: 86 U/L (ref 38–126)
Anion gap: 5 (ref 5–15)
BUN: 20 mg/dL (ref 6–20)
CO2: 26 mmol/L (ref 22–32)
Calcium: 9.1 mg/dL (ref 8.9–10.3)
Chloride: 109 mmol/L (ref 98–111)
Creatinine, Ser: 0.64 mg/dL (ref 0.44–1.00)
GFR, Estimated: >60 mL/min (ref >60)
Glucose, Bld: 98 mg/dL (ref 70–99)
Potassium: 4.2 mmol/L (ref 3.5–5.1)
Sodium: 140 mmol/L (ref 135–145)
Total Bilirubin: 0.8 mg/dL (ref 0.3–1.2)
Total Protein: 7.3 g/dL (ref 6.5–8.1)

## 2022-03-13 LAB — TROPONIN I (HIGH SENSITIVITY): Troponin I (High Sensitivity): 2 ng/L (ref <18)

## 2022-03-13 LAB — CBC
HCT: 44.5 % (ref 36.0–46.0)
Hemoglobin: 14 g/dL (ref 12.0–15.0)
MCH: 27.5 pg (ref 26.0–34.0)
MCHC: 31.5 g/dL (ref 30.0–36.0)
MCV: 87.3 fL (ref 80.0–100.0)
Platelets: 202 10*3/uL (ref 150–400)
RBC: 5.1 MIL/uL (ref 3.87–5.11)
RDW: 14.1 % (ref 11.5–15.5)
WBC: 5.3 10*3/uL (ref 4.0–10.5)
nRBC: 0 % (ref 0.0–0.2)

## 2022-03-13 LAB — URINALYSIS, ROUTINE W REFLEX MICROSCOPIC
Bacteria, UA: NONE SEEN
Bilirubin Urine: NEGATIVE
Glucose, UA: NEGATIVE mg/dL
Hgb urine dipstick: NEGATIVE
Ketones, ur: 5 mg/dL — AB
Nitrite: NEGATIVE
Protein, ur: NEGATIVE mg/dL
Specific Gravity, Urine: 1.018 (ref 1.005–1.030)
pH: 5 (ref 5.0–8.0)

## 2022-03-13 LAB — LIPASE, BLOOD: Lipase: 39 U/L (ref 11–51)

## 2022-03-13 NOTE — ED Triage Notes (Signed)
Pt reports pain in her RUQ for several weeks. Pt states over the last week she noticed that she was swelling in that area. Pt states its feels pressure like and sore and gets worse with certain movements.

## 2022-03-13 NOTE — ED Provider Notes (Signed)
[facility] Provider Note    Event Date/Time   First MD Initiated Contact with Patient 03/13/22 [phone]     (approximate)   History   Chief Complaint Abdominal Pain   HPI  Kristina Savage is a 61 y.o. female with past medical history of generalized anxiety disorder and Parkinson disease who presents to the ED complaining of abdominal pain.  Patient reports that she has been dealing with 2 to 3 weeks of intermittent pain in the right upper quadrant of her abdomen.  Pain seems to be worse in the morning and exacerbated by certain positions.  She has not had any nausea or vomiting, states she has been eating and drinking without difficulty.  She denies any fevers, dysuria, hematuria, or flank pain.  She endorses a cough and states the pain will occasionally move up into the right side of her chest, denies any difficulty breathing or chest pain currently.  She has not noticed any pain or swelling in her legs.     Physical Exam   Triage Vital Signs: ED Triage Vitals  Enc Vitals Group     BP --      Pulse --      Resp --      Temp --      Temp src --      SpO2 --      Weight 03/13/22 0842 265 lb (120.2 kg)     Height 03/13/22 0842 '5\' 4"'$  (1.626 m)     Head Circumference --      Peak Flow --      Pain Score 03/13/22 0841 3     Pain Loc --      Pain Edu? --      Excl. in [place]? --     Most recent vital signs: Vitals:   03/13/22 0910  BP: 107/77  Pulse: 63  Resp: 18  Temp: 98.1 F (36.7 C)  SpO2: 97%    Constitutional: Alert and oriented. Eyes: Conjunctivae are normal. Head: Atraumatic. Nose: No congestion/rhinnorhea. Mouth/Throat: Mucous membranes are moist.  Cardiovascular: Normal rate, regular rhythm. Grossly normal heart sounds.  2+ radial pulses bilaterally. Respiratory: Normal respiratory effort.  No retractions. Lungs CTAB. Gastrointestinal: Soft and tender to palpation in the right upper quadrant with no rebound or guarding. No  distention. Musculoskeletal: No lower extremity tenderness nor edema.  Neurologic:  Normal speech and language. No gross focal neurologic deficits are appreciated.    ED Results / Procedures / Treatments   Labs (all labs ordered are listed, but only abnormal results are displayed) Labs Reviewed  URINALYSIS, ROUTINE W REFLEX MICROSCOPIC - Abnormal; Notable for the following components:      Result Value   Color, Urine YELLOW (*)    APPearance HAZY (*)    Ketones, ur 5 (*)    Leukocytes,Ua SMALL (*)    All other components within normal limits  LIPASE, BLOOD  COMPREHENSIVE METABOLIC PANEL  CBC  TROPONIN I (HIGH SENSITIVITY)     EKG  ED ECG REPORT I, Blake Divine, the attending physician, personally viewed and interpreted this ECG.   Date: 03/13/2022  EKG Time: 8:47  Rate: 67  Rhythm: normal sinus rhythm  Axis: Normal  Intervals:none  ST&T Change: None  RADIOLOGY Right upper quadrant ultrasound reviewed and interpreted by me with no gallstones, gallbladder wall thickening, or pericholecystic fluid.  Chest x-ray reviewed and interpreted by me with no infiltrate, edema, or effusion.  PROCEDURES:  Critical Care  performed: No  Procedures   MEDICATIONS ORDERED IN ED: Medications - No data to display   IMPRESSION / MDM / [place] / ED COURSE  I reviewed the triage vital signs and the nursing notes.                              61 y.o. female with past medical history of GAD and Parkinson disease who presents to the ED complaining of 2-3 3 weeks of intermittent pain in the right upper quadrant of her abdomen and that occasionally moves up into her chest.  Patient's presentation is most consistent with acute presentation with potential threat to life or bodily function.  Differential diagnosis includes, but is not limited to, biliary colic, cholecystitis, hepatitis, pancreatitis, GERD, pneumonia, pneumothorax, PE, ACS.  Patient well-appearing and in no  acute distress, vital signs are unremarkable.  Pain is reproducible with palpation of her right upper quadrant and we will further assess with ultrasound for biliary pathology.  Labs thus far are reassuring with no significant anemia, leukocytosis, electrolyte abnormality, or AKI.  LFTs and lipase are within normal limits, no findings to suggest hepatitis or pancreatitis.  She reports some pain moving up into her chest but symptoms are very atypical for ACS, EKG shows no evidence of arrhythmia or ischemia, we will screen troponin.  Chest x-ray is also pending, doubt PE given no respiratory symptoms and no chest pain currently with reassuring vital signs.  Right upper quadrant ultrasound is unremarkable, urinalysis shows no signs of infection.  Chest x-ray does show mild interstitial thickening, no reason to suspect CHF at this time and symptoms do not seem consistent with bacterial pneumonia.  No apparent emergent cause for patient's symptoms and she is appropriate for discharge home with PCP follow-up.  She was counseled to return to the ED for new or worsening symptoms, patient agrees with plan.      FINAL CLINICAL IMPRESSION(S) / ED DIAGNOSES   Final diagnoses:  RUQ pain     Rx / DC Orders   ED Discharge Orders     None        Note:  This document was prepared using Dragon voice recognition software and may include unintentional dictation errors.   Blake Divine, MD 03/13/22 1136

## 2022-03-13 NOTE — ED Notes (Signed)
Pt A&O, pt left without getting discharge instructions and vital signs, pt able to ambulate with steady gait.

## 2022-03-15 ENCOUNTER — Telehealth: Payer: Self-pay

## 2022-03-15 ENCOUNTER — Other Ambulatory Visit: Payer: Self-pay | Admitting: Psychiatry

## 2022-03-15 ENCOUNTER — Telehealth: Payer: Self-pay | Admitting: Psychiatry

## 2022-03-15 DIAGNOSIS — F331 Major depressive disorder, recurrent, moderate: Secondary | ICD-10-CM

## 2022-03-15 DIAGNOSIS — F411 Generalized anxiety disorder: Secondary | ICD-10-CM

## 2022-03-15 DIAGNOSIS — F4010 Social phobia, unspecified: Secondary | ICD-10-CM

## 2022-03-15 MED ORDER — DULOXETINE HCL 30 MG PO CPEP: 30.0000 mg | ORAL_CAPSULE | Freq: Two times a day (BID) | ORAL | 0 refills | Status: DC

## 2022-03-15 NOTE — Progress Notes (Unsigned)
Chief Complaint:   OBESITY Kristina Savage is here to discuss her progress with her obesity treatment plan along with follow-up of her obesity related diagnoses. Kristina Savage is on the Category 2 Plan and states she is following her eating plan approximately 95% of the time. Kristina Savage states she is doing home exercises 30 minutes 3 times per week.  Today's visit was #: 3 Starting weight: 282 lbs Starting date: 02/02/2022 Today's weight: 264 lbs Today's date: 03/08/2022 Total lbs lost to date: 18 lbs Total lbs lost since last in-office visit: 8  Interim History:  Since starting [facility], Kristina Savage endorses improved sense of taste. She is quite pleased to have lost 18 lbs since beginning Cat 2 meal plan early July 2023.  Subjective:   1. Vitamin D deficiency Kristina Savage started on ergocalciferol last office visit--02/16/22. She denies N/V/muscle weakness.  2. Functional tremor Kristina Savage paternal grandmother and her father both suffered Parkinson's.  3. GAD (generalized anxiety disorder) Kristina Savage rarely uses as needed Klonopin 0.5 mg-1 tab. She is also on Propranolol 10 mg three times a day, Duloxetine 30 mg twice a day. She stopped Duloxetine '30mg'$  BID--several days ago, and reports resolution of  RUQ pain.  4. At risk for impaired function of liver Kristina Savage is at risk for impaired function of liver due to likely diagnosis of fatty liver as evidenced by recent elevated liver enzymes.  Assessment/Plan:   1. Vitamin D deficiency We will refill Ergocalciferol 50,00 IU once a week for 1 month with 0 refills.  -Refill Vitamin D, Ergocalciferol, (DRISDOL) 1.25 MG (50000 UNIT) CAPS capsule; Take 1 capsule (50,000 Units total) by mouth every 7 (seven) days.  Dispense: 4 capsule; Refill: 0  2. Functional tremor Follow up with specialists as directed.  3. GAD (generalized anxiety disorder) Since stopping Duloxetine 30 mg--RUQ pain has resolved.  Continue medication management per Psychiatrist.   4.  At risk for impaired function of liver Kristina Savage was given approximately 15 minutes of counseling today regarding prevention of impaired liver function. Lara was educated about her risk of developing NASH or even liver failure and advised that the only proven treatment for NAFLD was weight loss of at least 5-10% of body weight.   5. Obesity, current BMI 45.4 Kristina Savage is currently in the action stage of change. As such, her goal is to continue with weight loss efforts. She has agreed to the Category 2 Plan.   Exercise goals: As is.  Behavioral modification strategies: increasing lean protein intake, decreasing simple carbohydrates, meal planning and cooking strategies, keeping healthy foods in the home, and planning for success.  Kristina Savage has agreed to follow-up with our clinic in 3 weeks. She was informed of the importance of frequent follow-up visits to maximize her success with intensive lifestyle modifications for her multiple health conditions.   Objective:   Blood pressure 130/80, pulse 65, temperature (!) 97.2 F (36.2 C), height '5\' 4"'$  (1.626 m), weight 264 lb (119.7 kg), SpO2 98 %. Body mass index is 45.32 kg/m.  General: Cooperative, alert, well developed, in no acute distress. HEENT: Conjunctivae and lids unremarkable. Cardiovascular: Regular rhythm.  Lungs: Normal work of breathing. Neurologic: No focal deficits.   Lab Results  Component Value Date   CREATININE 0.64 03/13/2022   BUN 20 03/13/2022   NA 140 03/13/2022   K 4.2 03/13/2022   CL 109 03/13/2022   CO2 26 03/13/2022   Lab Results  Component Value Date   ALT 5 03/13/2022   AST  17 03/13/2022   ALKPHOS 86 03/13/2022   BILITOT 0.8 03/13/2022   Lab Results  Component Value Date   HGBA1C 6.1 (H) 02/02/2022   HGBA1C 6.2 05/15/2020   HGBA1C 5.8 08/07/2013   Lab Results  Component Value Date   INSULIN 21.2 02/02/2022   Lab Results  Component Value Date   TSH 2.030 02/02/2022   Lab Results  Component Value Date    CHOL 141 02/02/2022   HDL 58 02/02/2022   LDLCALC 69 02/02/2022   TRIG 67 02/02/2022   CHOLHDL 2 03/10/2021   Lab Results  Component Value Date   VD25OH 34.5 02/02/2022   Lab Results  Component Value Date   WBC 5.3 03/13/2022   HGB 14.0 03/13/2022   HCT 44.5 03/13/2022   MCV 87.3 03/13/2022   PLT 202 03/13/2022   No results found for: "IRON", "TIBC", "FERRITIN"  Attestation Statements:   Reviewed by clinician on day of visit: allergies, medications, problem list, medical history, surgical history, family history, social history, and previous encounter notes.  I, Ruby Dilone, RMA, am acting as transcriptionist for Mina Marble, NP.  I have reviewed the above documentation for accuracy and completeness, and I agree with the above. -  Katy d. Danford, NP-C

## 2022-03-15 NOTE — Telephone Encounter (Signed)
I have sent duloxetine-refill to pharmacy-30 mg twice a day.

## 2022-03-15 NOTE — Telephone Encounter (Signed)
Returned call to patient.  Patient reports she had abdominal pain and thought she had liver problems and hence stopped taking the Cymbalta.  However she was able to go to the emergency department and they ruled out any liver problems.  She reports she is currently struggling with mood and sleep and wonders whether she can go back on the Cymbalta.  Advised to restart Cymbalta 30 mg 1 dose today and go back to 30 mg twice a day-previous dosage tomorrow.  Patient also advised to use the clonazepam as needed for breakthrough anxiety.  Patient is not interested in an appointment for reevaluation in the next 1 week when it was discussed, reports she is leaving town.  She will only be back on September 14.  She does have an upcoming appointment in [place] will keep it.

## 2022-03-15 NOTE — Telephone Encounter (Signed)
pt called left message that she needed something for her anxiety

## 2022-03-17 ENCOUNTER — Encounter (INDEPENDENT_AMBULATORY_CARE_PROVIDER_SITE_OTHER): Payer: Self-pay | Admitting: Family Medicine

## 2022-03-17 ENCOUNTER — Ambulatory Visit (INDEPENDENT_AMBULATORY_CARE_PROVIDER_SITE_OTHER): Payer: Commercial Managed Care - HMO | Admitting: Family Medicine

## 2022-03-17 VITALS — BP 112/81 | HR 75 | Temp 97.5°F | Ht 64.0 in | Wt 263.0 lb

## 2022-03-17 DIAGNOSIS — Z6841 Body Mass Index (BMI) 40.0 and over, adult: Secondary | ICD-10-CM

## 2022-03-17 DIAGNOSIS — F411 Generalized anxiety disorder: Secondary | ICD-10-CM | POA: Diagnosis not present

## 2022-03-17 DIAGNOSIS — E559 Vitamin D deficiency, unspecified: Secondary | ICD-10-CM

## 2022-03-17 DIAGNOSIS — E669 Obesity, unspecified: Secondary | ICD-10-CM | POA: Diagnosis not present

## 2022-03-17 DIAGNOSIS — E7849 Other hyperlipidemia: Secondary | ICD-10-CM

## 2022-03-23 ENCOUNTER — Other Ambulatory Visit: Payer: Self-pay | Admitting: Psychiatry

## 2022-03-23 DIAGNOSIS — F4010 Social phobia, unspecified: Secondary | ICD-10-CM

## 2022-03-23 DIAGNOSIS — F411 Generalized anxiety disorder: Secondary | ICD-10-CM

## 2022-03-23 NOTE — Progress Notes (Signed)
Chief Complaint:   OBESITY Kristina Savage is here to discuss her progress with her obesity treatment plan along with follow-up of her obesity related diagnoses. Kristina Savage is on the Category 2 Plan and states she is following her eating plan approximately 90% of the time. Kristina Savage states she is doing weights and crunches 15-20 minutes 7 times per week.  Today's visit was #: 4 Starting weight: 282 lbs Starting date: 02/02/2022 Today's weight: 263 lbs Today's date: 03/17/2022 Total lbs lost to date: 19 Total lbs lost since last in-office visit: 1  Interim History: Kristina Savage is going to [place] with her sister for 2 weeks and is very concerned with eating while traveling. She is eating extra bread and fatty beef.  Subjective:   1. Vitamin D deficiency She is currently taking prescription vitamin D 50,000 IU each week. She denies nausea, vomiting or muscle weakness.  2. Other hyperlipidemia Kristina Savage has hyperlipidemia and has been trying to improve her cholesterol levels with intensive lifestyle modifications including a low saturated fat diet, exercise, and weight loss. She denies any chest pain, claudication, or myalgias. Medication: Lipitor  3. GAD (generalized anxiety disorder) Pt is back on Cymbalta daily for anxiety. She also has Klonopin but rarely uses it.  Assessment/Plan:  No orders of the defined types were placed in this encounter.   There are no discontinued medications.   No orders of the defined types were placed in this encounter.    1. Vitamin D deficiency Low Vitamin D level contributes to fatigue and are associated with obesity, breast, and colon cancer. She agrees to continue to take prescription Vitamin D '@50'$ ,000 IU every week and will follow-up for routine testing of Vitamin D, at least 2-3 times per year to avoid over-replacement.  2. Other hyperlipidemia Cardiovascular risk and specific lipid/LDL goals reviewed.  We discussed several lifestyle modifications today and Kristina Savage  will continue to work on diet, exercise and weight loss efforts. Orders and follow up as documented in patient record.   Counseling Intensive lifestyle modifications are the first line treatment for this issue. Dietary changes: Increase soluble fiber. Decrease simple carbohydrates. Exercise changes: Moderate to vigorous-intensity aerobic activity 150 minutes per week if tolerated. Lipid-lowering medications: see documented in medical record.  3. GAD (generalized anxiety disorder) Stable now that pt is back on Cymbalta. Behavior modification techniques were discussed today to help Kristina Savage deal with her anxiety.  Orders and follow up as documented in patient record.   4. Obesity, current BMI 45.2 Kristina Savage is currently in the action stage of change. As such, her goal is to continue with weight loss efforts. She has agreed to the Category 2 Plan with breakfast and lunch options.   Extensive discussion on travel and eating/food choices. We added breakfast and lunch options for variety.  Exercise goals:  As is  Behavioral modification strategies: increasing lean protein intake, dealing with family or coworker sabotage, travel eating strategies, and avoiding temptations.  Kristina Savage has agreed to follow-up with our clinic in 3 weeks. She was informed of the importance of frequent follow-up visits to maximize her success with intensive lifestyle modifications for her multiple health conditions.   Objective:   Blood pressure 112/81, pulse 75, temperature (!) 97.5 F (36.4 C), height '5\' 4"'$  (1.626 m), weight 263 lb (119.3 kg), SpO2 96 %. Body mass index is 45.14 kg/m.  General: Cooperative, alert, well developed, in no acute distress. HEENT: Conjunctivae and lids unremarkable. Cardiovascular: Regular rhythm.  Lungs: Normal work of breathing. Neurologic:  No focal deficits.   Lab Results  Component Value Date   CREATININE 0.64 03/13/2022   BUN 20 03/13/2022   NA 140 03/13/2022   K 4.2 03/13/2022    CL 109 03/13/2022   CO2 26 03/13/2022   Lab Results  Component Value Date   ALT 5 03/13/2022   AST 17 03/13/2022   ALKPHOS 86 03/13/2022   BILITOT 0.8 03/13/2022   Lab Results  Component Value Date   HGBA1C 6.1 (H) 02/02/2022   HGBA1C 6.2 05/15/2020   HGBA1C 5.8 08/07/2013   Lab Results  Component Value Date   INSULIN 21.2 02/02/2022   Lab Results  Component Value Date   TSH 2.030 02/02/2022   Lab Results  Component Value Date   CHOL 141 02/02/2022   HDL 58 02/02/2022   LDLCALC 69 02/02/2022   TRIG 67 02/02/2022   CHOLHDL 2 03/10/2021   Lab Results  Component Value Date   VD25OH 34.5 02/02/2022   Lab Results  Component Value Date   WBC 5.3 03/13/2022   HGB 14.0 03/13/2022   HCT 44.5 03/13/2022   MCV 87.3 03/13/2022   PLT 202 03/13/2022   Attestation Statements:   Reviewed by clinician on day of visit: allergies, medications, problem list, medical history, surgical history, family history, social history, and previous encounter notes.  Time spent on visit including pre-visit chart review and post-visit care and charting was 30 minutes.   I, Kathlene November, BS, CMA, am acting as transcriptionist for [employer], DO.   I have reviewed the above documentation for accuracy and completeness, and I agree with the above. Marjory Sneddon, D.O.  The [place] was signed into law in 2016 which includes the topic of electronic health records.  This provides immediate access to information in MyChart.  This includes consultation notes, operative notes, office notes, lab results and pathology reports.  If you have any questions about what you read please let us know at your next visit so we can discuss your concerns and take corrective action if need be.  We are right here with you.

## 2022-04-05 ENCOUNTER — Ambulatory Visit: Payer: Commercial Managed Care - HMO | Admitting: Psychiatry

## 2022-04-10 ENCOUNTER — Other Ambulatory Visit: Payer: Self-pay | Admitting: Psychiatry

## 2022-04-10 DIAGNOSIS — F4010 Social phobia, unspecified: Secondary | ICD-10-CM

## 2022-04-10 DIAGNOSIS — F411 Generalized anxiety disorder: Secondary | ICD-10-CM

## 2022-04-12 ENCOUNTER — Ambulatory Visit (INDEPENDENT_AMBULATORY_CARE_PROVIDER_SITE_OTHER): Payer: Commercial Managed Care - HMO | Admitting: Family Medicine

## 2022-04-12 ENCOUNTER — Encounter (INDEPENDENT_AMBULATORY_CARE_PROVIDER_SITE_OTHER): Payer: Self-pay | Admitting: Family Medicine

## 2022-04-12 VITALS — BP 105/74 | HR 59 | Temp 97.6°F | Ht 64.0 in | Wt 256.0 lb

## 2022-04-12 DIAGNOSIS — E669 Obesity, unspecified: Secondary | ICD-10-CM

## 2022-04-12 DIAGNOSIS — Z6841 Body Mass Index (BMI) 40.0 and over, adult: Secondary | ICD-10-CM

## 2022-04-12 DIAGNOSIS — Z9189 Other specified personal risk factors, not elsewhere classified: Secondary | ICD-10-CM

## 2022-04-12 DIAGNOSIS — E559 Vitamin D deficiency, unspecified: Secondary | ICD-10-CM

## 2022-04-12 DIAGNOSIS — E538 Deficiency of other specified B group vitamins: Secondary | ICD-10-CM

## 2022-04-12 MED ORDER — VITAMIN D (ERGOCALCIFEROL) 1.25 MG (50000 UNIT) PO CAPS: 50000.0000 [IU] | ORAL_CAPSULE | ORAL | 0 refills | Status: DC

## 2022-04-13 ENCOUNTER — Ambulatory Visit (INDEPENDENT_AMBULATORY_CARE_PROVIDER_SITE_OTHER): Payer: Commercial Managed Care - HMO | Admitting: Psychiatry

## 2022-04-13 ENCOUNTER — Encounter: Payer: Self-pay | Admitting: Psychiatry

## 2022-04-13 VITALS — BP 109/71 | HR 61 | Temp 98.3°F | Ht 64.0 in | Wt 260.0 lb

## 2022-04-13 DIAGNOSIS — F3342 Major depressive disorder, recurrent, in full remission: Secondary | ICD-10-CM | POA: Diagnosis not present

## 2022-04-13 DIAGNOSIS — F17200 Nicotine dependence, unspecified, uncomplicated: Secondary | ICD-10-CM

## 2022-04-13 DIAGNOSIS — F411 Generalized anxiety disorder: Secondary | ICD-10-CM

## 2022-04-13 DIAGNOSIS — F4010 Social phobia, unspecified: Secondary | ICD-10-CM

## 2022-04-13 NOTE — Progress Notes (Unsigned)
[facility] MD OP Progress Note  04/13/2022 10:46 AM Kristina Savage  MRN:  [id number]  Chief Complaint:  Chief Complaint  Patient presents with   Follow-up: 61 year old Caucasian female with history of parkinsonian symptoms, depression, anxiety, presented for medication management.   HPI: Kristina Savage is a 60 year old Caucasian female, married, unemployed, applying for disability, lives in [place], has a history of MDD, GAD, social anxiety disorder, parkinsonian symptoms, prediabetes, hyperlipidemia, history of left cerebellar stroke, currently on aspirin plus statin therapy, history of hypovitaminosis, history of obstructive sleep apnea on CPAP, presented for medication management.  Patient today reports she is currently doing well with regards to her mood.  Denies any significant sadness.  Reports anxiety symptoms are under control.  She has been getting out, going out for walks.  She was able to spend some time in [place] with family and that went well.  Patient continues to have tremors, currently under the care of neurology.  Continues to be on carbidopa/levodopa.  Reports she has bouts of it, comes and goes.  Does have propranolol available which helps.  Also has clonazepam as needed although she has not been using it.  Currently compliant on the Cymbalta, denies side effects.  Reports sleep as good.  Reports appetite is fair.  Denies any suicidality, homicidality or perceptual disturbances.  Patient reports she is interested in smoking cessation, receptive to counseling.  Denies any other concerns today.  Visit Diagnosis:    ICD-10-CM   1. MDD (major depressive disorder), recurrent, in full remission ([place])  F33.42     2. GAD (generalized anxiety disorder)  F41.1     3. Social anxiety disorder  F40.10     4. Tobacco use disorder  F17.200       Past Psychiatric History: Reviewed past psychiatric history from progress note on 09/01/2021.  Past Medical History:  Past Medical  History:  Diagnosis Date   Anxiety    B12 deficiency    Chicken pox    Depression    Diverticulitis    Dizziness    Genital warts    GERD (gastroesophageal reflux disease)    Hay fever    Hyperlipidemia    Ovarian cyst    Parkinson's disease ([place])    Dr.Shaw Hollandale Bock and at [facility].  Functional/neurological condition.   Poor balance    Pre-diabetes    Sleep apnea    Stroke [facility])    UTI (lower urinary tract infection)    Vitamin D deficiency     Past Surgical History:  Procedure Laterality Date   CERVIX REMOVAL     Laser surgery, not complete removal   CESAREAN SECTION     CHONDROPLASTY Right 06/11/2020   Procedure: CHONDROPLASTY;  Surgeon: Netta Cedars, MD;  Location: [place];  Service: Orthopedics;  Laterality: Right;   KNEE ARTHROSCOPY WITH MEDIAL MENISECTOMY Right 06/11/2020   Procedure: KNEE ARTHROSCOPY WITH MEDIAL MENISECTOMY;  Surgeon: Netta Cedars, MD;  Location: [place];  Service: Orthopedics;  Laterality: Right;    Family Psychiatric History: Reviewed family psychiatric history from progress note on 09/01/2021.  Family History:  Family History  Problem Relation Age of Onset   Arthritis Mother    Hypertension Mother    COPD Mother    Heart failure Mother    Heart disease Mother    Depression Mother    Eating disorder Mother    Obesity Mother    Arthritis Father    Thyroid disease Father  thinks cancer   Parkinson's disease Father    Alcohol abuse Sister    Breast cancer Maternal Grandmother    Diabetes Paternal Grandmother    Parkinson's disease Paternal Grandmother    Diabetes Son    Cancer Maternal Aunt        Female cancer - not sure what   Breast cancer Maternal Aunt    Prostate cancer Maternal Uncle    Colon cancer Maternal Uncle 38    Social History: Reviewed social history from progress note on 09/01/2021. Social History   Socioeconomic History   Marital status: Married    Spouse name: [name]    Number of children: 4   Years of education: High school   Highest education level: Not on file  Occupational History   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  Tobacco Use   Smoking status: Every Day    Packs/day: 0.25    Years: 30.00    Total pack years: 7.50    Types: Cigarettes    Passive exposure: Past   Smokeless tobacco: Never  Vaping Use   Vaping Use: Not on file  Substance and Sexual Activity   Alcohol use: Not Currently    Alcohol/week: 0.0 standard drinks of alcohol    Comment: once a year   Drug use: No   Sexual activity: Yes    Birth control/protection: Post-menopausal  Other Topics Concern   Not on file  Social History Narrative   04/24/20   From: [place] - moved to [place] to raise her family   Living: with husband, [name] (1991)         Family: 7 children - including step children - twin boys nearby and daughter nearby - 10 grandchildren      Enjoys: spend time with family      Exercise: knee limiting exercise - tries to walk   Diet: eats whatever      Safety   Seat belts: Yes    Guns: Yes  and secure   Safe in relationships: Yes    Social Determinants of Health   Financial Resource Strain: Not on file  Food Insecurity: Not on file  Transportation Needs: Not on file  Physical Activity: Not on file  Stress: Not on file  Social Connections: Not on file    Allergies:  Allergies  Allergen Reactions   Oxycodone-Acetaminophen Nausea And Vomiting   Percocet [Oxycodone-Acetaminophen] Nausea And Vomiting   Pollen Extract     Nasal congestion   Prozac [Fluoxetine] Nausea Only    Metabolic Disorder Labs: Lab Results  Component Value Date   HGBA1C 6.1 (H) 02/02/2022   No results found for: "PROLACTIN" Lab Results  Component Value Date   CHOL 141 02/02/2022   TRIG 67 02/02/2022   HDL 58 02/02/2022   CHOLHDL 2 03/10/2021   VLDL 16.4 03/10/2021   LDLCALC 69 02/02/2022   LDLCALC 73 03/10/2021   Lab Results  Component Value Date   TSH  2.030 02/02/2022   TSH 2.11 05/15/2020    Therapeutic Level Labs: No results found for: "LITHIUM" No results found for: "VALPROATE" No results found for: "CBMZ"  Current Medications: Current Outpatient Medications  Medication Sig Dispense Refill   aspirin EC 81 MG tablet Take 81 mg by mouth daily. Swallow whole.     atorvastatin (LIPITOR) 10 MG tablet TAKE 1 TABLET BY MOUTH EVERY DAY 90 tablet 3   carbidopa-levodopa (SINEMET IR) 25-100 MG tablet Take 2 tablets by mouth 3 (three) times  daily.     cyanocobalamin (CYANOCOBALAMIN) 500 MCG tablet 200-542mg QD     DULoxetine (CYMBALTA) 30 MG capsule Take 1 capsule (30 mg total) by mouth 2 (two) times daily. 180 capsule 0   propranolol (INDERAL) 10 MG tablet TAKE 1 TABLET (10 MG TOTAL) BY MOUTH 3 (THREE) TIMES DAILY AS NEEDED. FOR SEVERE ANXIETY 270 tablet 0   Vitamin D, Ergocalciferol, (DRISDOL) 1.25 MG (50000 UNIT) CAPS capsule Take 1 capsule (50,000 Units total) by mouth every 7 (seven) days. 4 capsule 0   clonazePAM (KLONOPIN) 0.5 MG tablet Take 0.5-1 tablets (0.25-0.5 mg total) by mouth as directed. Take half to 1 tablet 1-2 times a week for severe anxiety, agitation (Patient not taking: Reported on 04/13/2022) 7 tablet 1   nicotine (NICODERM CQ - DOSED IN MG/24 HOURS) 14 mg/24hr patch Place 1 patch (14 mg total) onto the skin daily. 28 patch 0   No current facility-administered medications for this visit.     Musculoskeletal: Strength & Muscle Tone: within normal limits Gait & Station: normal Patient leans: N/A  Psychiatric Specialty Exam: Review of Systems  Neurological:  Positive for tremors.  Psychiatric/Behavioral:  The patient is nervous/anxious.   All other systems reviewed and are negative.   There were no vitals taken for this visit.There is no height or weight on file to calculate BMI.  General Appearance: Casual  Eye Contact:  Fair  Speech:  Clear and Coherent  Volume:  Normal  Mood:  Anxious  Affect:  Full Range   Thought Process:  Goal Directed and Descriptions of Associations: Intact  Orientation:  Full (Time, Place, and Person)  Thought Content: Logical   Suicidal Thoughts:  No  Homicidal Thoughts:  No  Memory:  Immediate;   Fair Recent;   Fair Remote;   Fair  Judgement:  Fair  Insight:  Fair  Psychomotor Activity:  Tremor  Concentration:  Concentration: Fair and Attention Span: Fair  Recall:  FAES Corporationof Knowledge: Fair  Language: Fair  Akathisia:  No  Handed:  Right  AIMS (if indicated): done  Assets:  Communication Skills Desire for Improvement Housing Social Support  ADL's:  Intact  Cognition: WNL  Sleep:  Fair   Screenings: AAdministrator, Civil ServiceOffice Visit from 11/24/2021 in APinevilleOffice Visit from 09/01/2021 in AMorganvilleTotal Score 0 1HighlandsVisit from 12/23/2021 in AMansfieldVisit from 11/24/2021 in AVictory LakesVisit from 09/23/2021 in AFrioVisit from 09/01/2021 in [place] Total GAD-7 Score '6 8 16 11      '$ PHQ2-9    FColverVisit from 02/03/2022 in APortiaOffice Visit from 02/02/2022 in CAnaholaOffice Visit from 12/23/2021 in ASan ElizarioVisit from 11/24/2021 in AGreenfieldVisit from 09/23/2021 in AMoffat PHQ-2 Total Score '3 6 2 2 5  '$ PHQ-9 Total Score '4 15 5 6 11      '$ FWaylandVisit from 04/13/2022 in ABartlettOffice Visit from 02/03/2022 in AViolaOffice Visit from 12/23/2021 in AWest LoganNo Risk No Risk No Risk         Assessment and Plan: RTANDREA KOMMERis  a 61 year old Caucasian female with history of MDD, GAD, social anxiety disorder, 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  Patient is currently stable with regards to her mood although she continues to have tremors, will benefit from following plan.  Plan MDD in remission Cymbalta 30 mg p.o. twice daily Continue CBT  GAD-stable Continue Cymbalta 30 mg p.o. twice daily Propranolol 10 mg p.o. 3 times daily as needed for anxiety attacks Clonazepam 0.25-0.5 mg as needed for severe anxiety attacks only Continue CBT as needed  Social anxiety disorder-improving Continue CBT  Tobacco use disorder-unstable Provided counseling for 1 minute.  Continue NicoDerm 14 mg per 24-hour patch daily. Patient With tremors to continue to follow-up with neurology.  Follow up in clinic in 3 months or sooner if needed.  This note was generated in part or whole with voice recognition software. Voice recognition is usually quite accurate but there are transcription errors that can and very often do occur. I apologize for any typographical errors that were not detected and corrected.    Ursula Alert, MD 04/13/2022, 10:46 AM

## 2022-04-18 NOTE — Progress Notes (Unsigned)
Chief Complaint:   OBESITY Kristina Savage is here to discuss her progress with her obesity treatment plan along with follow-up of her obesity related diagnoses. Kristina Savage is on the Category 2 Plan with breakfast and lunch options and states she is following her eating plan approximately 75% of the time. [name] states she is walking 1.5 miles and swimming 60 minutes 2-7 times per week.  Today's visit was #: 5 Starting weight: 282 lbs Starting date: 02/02/2022 Today's weight: 256 lbs Today's date: 04/12/2022 Total lbs lost to date: 26 Total lbs lost since last in-office visit: 7  Interim History: Kristina Savage was in [place] for 2.5 weeks on vacation. She did great, has started walking more, and lost 7 lbs. Bioimpedance scale shows pt is up 2.1 lbs in muscle mass and down 9 lbs in fat mass.  Subjective:   1. Vitamin D deficiency She is currently taking prescription vitamin D 50,000 IU each week. She denies nausea, vomiting or muscle weakness.  2. B12 deficiency - B12 level is 430, not at goal of over 500.  This diagnosis was reviewed with the patient and education was provided.  Lab Results  Component Value Date   VITAMINB12 430 02/02/2022   3. At risk for dehydration Carely is at risk for dehydration due to increasing her exercise.  Assessment/Plan:  No orders of the defined types were placed in this encounter.   Medications Discontinued During This Encounter  Medication Reason   Vitamin D, Ergocalciferol, (DRISDOL) 1.25 MG (50000 UNIT) CAPS capsule Reorder     Meds ordered this encounter  Medications   Vitamin D, Ergocalciferol, (DRISDOL) 1.25 MG (50000 UNIT) CAPS capsule    Sig: Take 1 capsule (50,000 Units total) by mouth every 7 (seven) days.    Dispense:  4 capsule    Refill:  0    30 d supply;  ** OV for RF **   Do not send RF request     1. Vitamin D deficiency - I again reiterated the importance of vitamin D (as well as calcium) to their health and wellbeing.  - I reviewed  possible symptoms of low Vitamin D:  low energy, depressed mood, muscle aches, joint aches, osteoporosis etc. - low Vitamin D levels may be linked to an increased risk of cardiovascular events and even increased risk of cancers- such as colon and breast.  - ideal vitamin D levels reviewed with patient  - I recommend pt take a 50,000  weekly prescription vit D - see script below   - Informed patient this may be a lifelong thing, and she was encouraged to continue to take the medicine until told otherwise.    - weight loss will likely improve availability of vitamin D, thus encouraged [name] to continue with meal plan and their weight loss efforts to further improve this condition.  Thus, we will need to monitor levels regularly (every 3-4 mo on average) to keep levels within normal limits and prevent over supplementation. - pt's questions and concerns regarding this condition addressed.  Refill- Vitamin D, Ergocalciferol, (DRISDOL) 1.25 MG (50000 UNIT) CAPS capsule; Take 1 capsule (50,000 Units total) by mouth every 7 (seven) days.  Dispense: 4 capsule; Refill: 0  2. B12 deficiency - We recommended patient continue her prudent nutritional plan and also continue OTC B12/ cyanocobalamin of 500 mcg daily - We will continue to monitor as deemed clinically necessary. - Counseling provided today, see below: - Your Vitamin B12 level is low. This vitamin is  very important for the proper functioning of nerves. A vitamin B12 deficiency can cause anemia and neurologic symptoms such as tingling and pain in the hands and feet and possible cognitive problems. - Vitamin B12 is naturally present in foods of animal origin, including fish, meat, poultry, eggs, and dairy products.  - Causes of vitamin B12 deficiency include difficulty absorbing vitamin B12 from food, lack of intrinsic factor (e.g., because of pernicious anemia), surgery in the gastrointestinal tract, prolonged use of certain medications (e.g., metformin  or proton pump inhibitors, etc and dietary deficiency.  - The effects of vitamin B12 deficiency:  can include the hallmark megaloblastic anemia, fatigue, heart palpitations; pale skin; dementia; and infertility.  Neurological changes, such as numbness and tingling in the hands and feet, can also occur.  These neurological symptoms can occur without anemia, so early diagnosis and intervention is important to avoid irreversible damage. In addition, some studies have found associations between vitamin B12 deficiency or low vitamin B12 intakes and depression.   3. At risk for dehydration Kristina Savage is at higher than average risk of dehydration.  Kristina Savage was given more than 9 minutes of proper hydration counseling today.  We discussed the signs and symptoms of dehydration, some of which may include muscle cramping, constipation or even orthostatic symptoms.  Counseling on the prevention of dehydration was also provided today.  Kristina Savage is at risk for dehydration due to weight loss, lifestyle and behavorial habits and possibly due to taking certain medication(s).  She was encouraged to adequately hydrate and monitor fluid status to avoid dehydration as well as weight loss plateaus.  Unless pre-existing renal or cardiopulmonary conditions exist, in which patient was told to limit their fluid intake, I recommended roughly one half of their weight in pounds to be the approximate ounces of non-caloric, non-caffeinated beverages they should drink per day; including more if they are engaging in exercise.  Kristina Savage is at higher than average risk of dehydration.  Kristina Savage was given more than 9 minutes of proper hydration counseling today.  We discussed the signs and symptoms of dehydration, some of which may include muscle cramping, constipation, or even orthostatic symptoms.  Counseling on the prevention of dehydration was also provided today.  Kristina Savage is at risk for dehydration due to weight loss, lifestyle and behavorial habits, and  possibly due to taking certain medication(s).  She was encouraged to adequately hydrate and monitor fluid status to avoid dehydration as well as weight loss plateaus.  Unless pre-existing renal or cardiopulmonary conditions exist, which pt was told to limit their fluid intake.  I recommended roughly one half of their weight in pounds to be the approximate ounces of non-caloric, non-caffeinated beverages they should drink per day; including more if they are engaging in exercise.  4. Obesity, current BMI 23 Delyla is currently in the action stage of change. As such, her goal is to continue with weight loss efforts. She has agreed to the Category 2 Plan 6 oz of protein at lunch, 8-10 oz of protein at dinner, and with breakfast and lunch options.   Exercise goals: For substantial health benefits, adults should do at least 150 minutes (2 hours and 30 minutes) a week of moderate-intensity, or 75 minutes (1 hour and 15 minutes) a week of vigorous-intensity aerobic physical activity, or an equivalent combination of moderate- and vigorous-intensity aerobic activity. Aerobic activity should be performed in episodes of at least 10 minutes, and preferably, it should be spread throughout the week.  Behavioral modification strategies: increasing lean  protein intake, decreasing simple carbohydrates, and planning for success.  Barbra has agreed to follow-up with our clinic in 2-3 weeks. She was informed of the importance of frequent follow-up visits to maximize her success with intensive lifestyle modifications for her multiple health conditions.   Objective:   Blood pressure 105/74, pulse (!) 59, temperature 97.6 F (36.4 C), height '5\' 4"'$  (1.626 m), weight 256 lb (116.1 kg), SpO2 98 %. Body mass index is 43.94 kg/m.  General: Cooperative, alert, well developed, in no acute distress. HEENT: Conjunctivae and lids unremarkable. Cardiovascular: Regular rhythm.  Lungs: Normal work of breathing. Neurologic: No focal  deficits.   Lab Results  Component Value Date   CREATININE 0.64 03/13/2022   BUN 20 03/13/2022   NA 140 03/13/2022   K 4.2 03/13/2022   CL 109 03/13/2022   CO2 26 03/13/2022   Lab Results  Component Value Date   ALT 5 03/13/2022   AST 17 03/13/2022   ALKPHOS 86 03/13/2022   BILITOT 0.8 03/13/2022   Lab Results  Component Value Date   HGBA1C 6.1 (H) 02/02/2022   HGBA1C 6.2 05/15/2020   HGBA1C 5.8 08/07/2013   Lab Results  Component Value Date   INSULIN 21.2 02/02/2022   Lab Results  Component Value Date   TSH 2.030 02/02/2022   Lab Results  Component Value Date   CHOL 141 02/02/2022   HDL 58 02/02/2022   LDLCALC 69 02/02/2022   TRIG 67 02/02/2022   CHOLHDL 2 03/10/2021   Lab Results  Component Value Date   VD25OH 34.5 02/02/2022   Lab Results  Component Value Date   WBC 5.3 03/13/2022   HGB 14.0 03/13/2022   HCT 44.5 03/13/2022   MCV 87.3 03/13/2022   PLT 202 03/13/2022    Attestation Statements:   Reviewed by clinician on day of visit: allergies, medications, problem list, medical history, surgical history, family history, social history, and previous encounter notes.  I, Kathlene November, BS, CMA, am acting as transcriptionist for [employer], DO.   I have reviewed the above documentation for accuracy and completeness, and I agree with the above. Marjory Sneddon, D.O.  The [place] was signed into law in 2016 which includes the topic of electronic health records.  This provides immediate access to information in MyChart.  This includes consultation notes, operative notes, office notes, lab results and pathology reports.  If you have any questions about what you read please let us know at your next visit so we can discuss your concerns and take corrective action if need be.  We are right here with you.

## 2022-04-23 ENCOUNTER — Other Ambulatory Visit: Payer: Self-pay | Admitting: Student

## 2022-04-23 DIAGNOSIS — R2689 Other abnormalities of gait and mobility: Secondary | ICD-10-CM

## 2022-04-29 ENCOUNTER — Encounter: Payer: Self-pay | Admitting: Family

## 2022-04-29 ENCOUNTER — Ambulatory Visit (INDEPENDENT_AMBULATORY_CARE_PROVIDER_SITE_OTHER): Payer: Commercial Managed Care - HMO | Admitting: Family

## 2022-04-29 VITALS — BP 106/70 | HR 64 | Temp 98.6°F | Resp 16 | Ht 64.0 in | Wt 258.4 lb

## 2022-04-29 DIAGNOSIS — M79604 Pain in right leg: Secondary | ICD-10-CM

## 2022-04-29 DIAGNOSIS — M79605 Pain in left leg: Secondary | ICD-10-CM

## 2022-04-29 DIAGNOSIS — R7303 Prediabetes: Secondary | ICD-10-CM | POA: Diagnosis not present

## 2022-04-29 DIAGNOSIS — R251 Tremor, unspecified: Secondary | ICD-10-CM

## 2022-04-29 NOTE — Patient Instructions (Addendum)
  After prescription dosing of vitamin D complete, start over the counter vitamin D3 2000 IU once daily.   A referral was placed today to order an ABI.  Please let us know if you have not heard back within 2 weeks about the referral.   Regards,   Erbie Arment FNP-C

## 2022-04-29 NOTE — Assessment & Plan Note (Signed)
Continue propanolol and f/u with neurology as scheduled

## 2022-04-29 NOTE — Assessment & Plan Note (Signed)
Reviewed last a1c Continue to work on diabetic diet

## 2022-04-29 NOTE — Assessment & Plan Note (Signed)
Ordering ABI pending results  R/o PAD , suspected  If negative will refer to vascular for w/u for intermittent claudification

## 2022-04-29 NOTE — Progress Notes (Signed)
Established Patient Office Visit  Subjective:  Patient ID: Kristina Savage, female    DOB: 1960-10-01  Age: 61 y.o. MRN: [id number]  CC:  Chief Complaint  Patient presents with   Neurologic Problem    HPI CONCEPTION DOEBLER is here today for follow up.   Being currently assessed for parkinsons syndrome. Is currently undergoing workup for parkinsons. Constant tremor left hand greater htan right. Pill rolling on right hand noted on exam. She states she does have head tremors as well, and notes her legs hurt while she walks. Does have positive family h/o parkinsons with her father and her grandma.   Neurology has requested ABI's lower extremity, and consider vascular referral as low suspicion for PAD. Neurology has ordered MRI lumbar spine, pending appt. Scheduled next week.   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 Still a smoker, getting ready to use   OSA: uses cpap nightly, with [facility]. Home sleep test was 2/22 with severe OSA.   Xray cervical spine with cervicalia right sided with DDD. Recommended physical therapy.    Ble pain with walking, intermittent claudification. After she goes walking to exercise feels as though her legs will give out.    Past Medical History:  Diagnosis Date   Anxiety    B12 deficiency    Chicken pox    Depression    Diverticulitis    Dizziness    Genital warts    GERD (gastroesophageal reflux disease)    Hay fever    Hyperlipidemia    Ovarian cyst    Parkinson's disease    Dr.Shaw [place] and at [facility].  Functional/neurological condition.   Poor balance    Pre-diabetes    Sleep apnea    Stroke [facility])    UTI (lower urinary tract infection)    Vitamin D deficiency     Past Surgical History:  Procedure Laterality Date   CERVIX REMOVAL     Laser surgery, not complete removal   CESAREAN SECTION     CHONDROPLASTY Right 06/11/2020   Procedure: CHONDROPLASTY;  Surgeon: Netta Cedars, MD;  Location:  [place];  Service: Orthopedics;  Laterality: Right;   KNEE ARTHROSCOPY WITH MEDIAL MENISECTOMY Right 06/11/2020   Procedure: KNEE ARTHROSCOPY WITH MEDIAL MENISECTOMY;  Surgeon: Netta Cedars, MD;  Location: [place];  Service: Orthopedics;  Laterality: Right;    Family History  Problem Relation Age of Onset   Arthritis Mother    Hypertension Mother    COPD Mother    Heart failure Mother    Heart disease Mother    Depression Mother    Eating disorder Mother    Obesity Mother    Arthritis Father    Thyroid disease Father        thinks cancer   Parkinson's disease Father    Alcohol abuse Sister    Breast cancer Maternal Grandmother    Diabetes Paternal Grandmother    Parkinson's disease Paternal Grandmother    Diabetes Son    Cancer Maternal Aunt        Female cancer - not sure what   Breast cancer Maternal Aunt    Prostate cancer Maternal Uncle    Colon cancer Maternal Uncle 8    Social History   Socioeconomic History   Marital status: Married    Spouse name: [name]   Number of children: 4   Years of education: High school   Highest education level: Not  on file  Occupational History   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  Tobacco Use   Smoking status: Every Day    Packs/day: 0.25    Years: 30.00    Total pack years: 7.50    Types: Cigarettes    Passive exposure: Past   Smokeless tobacco: Never  Vaping Use   Vaping Use: Not on file  Substance and Sexual Activity   Alcohol use: Not Currently    Alcohol/week: 0.0 standard drinks of alcohol    Comment: once a year   Drug use: No   Sexual activity: Yes    Birth control/protection: Post-menopausal  Other Topics Concern   Not on file  Social History Narrative   04/24/20   From: [place] - moved to [place] to raise her family   Living: with husband, [name] (1991)         Family: 7 children - including step children - twin boys nearby and daughter nearby - 10 grandchildren       Enjoys: spend time with family      Exercise: knee limiting exercise - tries to walk   Diet: eats whatever      Safety   Seat belts: Yes    Guns: Yes  and secure   Safe in relationships: Yes    Social Determinants of Health   Financial Resource Strain: Not on file  Food Insecurity: Not on file  Transportation Needs: Not on file  Physical Activity: Not on file  Stress: Not on file  Social Connections: Not on file  Intimate Partner Violence: Not on file    Outpatient Medications Prior to Visit  Medication Sig Dispense Refill   aspirin EC 81 MG tablet Take 81 mg by mouth daily. Swallow whole.     atorvastatin (LIPITOR) 10 MG tablet TAKE 1 TABLET BY MOUTH EVERY DAY 90 tablet 3   clonazePAM (KLONOPIN) 0.5 MG tablet Take 0.5-1 tablets (0.25-0.5 mg total) by mouth as directed. Take half to 1 tablet 1-2 times a week for severe anxiety, agitation 7 tablet 1   cyanocobalamin (CYANOCOBALAMIN) 500 MCG tablet 200-555mg QD     DULoxetine (CYMBALTA) 30 MG capsule Take 1 capsule (30 mg total) by mouth 2 (two) times daily. 180 capsule 0   nicotine (NICODERM CQ - DOSED IN MG/24 HOURS) 14 mg/24hr patch Place 1 patch (14 mg total) onto the skin daily. 28 patch 0   propranolol (INDERAL) 10 MG tablet TAKE 1 TABLET (10 MG TOTAL) BY MOUTH 3 (THREE) TIMES DAILY AS NEEDED. FOR SEVERE ANXIETY 270 tablet 0   Vitamin D, Ergocalciferol, (DRISDOL) 1.25 MG (50000 UNIT) CAPS capsule Take 1 capsule (50,000 Units total) by mouth every 7 (seven) days. 4 capsule 0   carbidopa-levodopa (SINEMET IR) 25-100 MG tablet Take 2 tablets by mouth 3 (three) times daily.     No facility-administered medications prior to visit.    Allergies  Allergen Reactions   Oxycodone-Acetaminophen Nausea And Vomiting   Percocet [Oxycodone-Acetaminophen] Nausea And Vomiting   Pollen Extract     Nasal congestion   Prozac [Fluoxetine] Nausea Only       Objective:    Physical Exam Constitutional:      General: She is not in  acute distress.    Appearance: Normal appearance. She is obese. She is not ill-appearing, toxic-appearing or diaphoretic.  Cardiovascular:     Rate and Rhythm: Normal rate and regular rhythm.     Pulses: Decreased pulses.  Dorsalis pedis pulses are 1+ on the right side and 1+ on the left side.       Posterior tibial pulses are 1+ on the right side and 1+ on the left side.     Comments: Purple discoloration and coldness to bil LE  Pulmonary:     Effort: Pulmonary effort is normal.     Breath sounds: Normal breath sounds.  Musculoskeletal:     Right lower leg: 1+ Pitting Edema present.     Left lower leg: 1+ Pitting Edema present.  Neurological:     Mental Status: She is alert and oriented to person, place, and time. Mental status is at baseline.     Sensory: Sensation is intact.     Motor: Tremor (left hand) present.     Gait: Gait normal.     Comments: Pill rolling observed right hand intermittently.  Delayed speech at times        BP 106/70   Pulse 64   Temp 98.6 F (37 C)   Resp 16   Ht 5' 4"  (1.626 m)   Wt 258 lb 6 oz (117.2 kg)   SpO2 97%   BMI 44.35 kg/m  Wt Readings from Last 3 Encounters:  04/29/22 258 lb 6 oz (117.2 kg)  04/12/22 256 lb (116.1 kg)  03/17/22 263 lb (119.3 kg)     Health Maintenance Due  Topic Date Due   COVID-19 Vaccine (4 - Moderna risk series) 09/09/2020   INFLUENZA VACCINE  02/23/2022    There are no preventive care reminders to display for this patient.  Lab Results  Component Value Date   TSH 2.030 02/02/2022   Lab Results  Component Value Date   WBC 5.3 03/13/2022   HGB 14.0 03/13/2022   HCT 44.5 03/13/2022   MCV 87.3 03/13/2022   PLT 202 03/13/2022   Lab Results  Component Value Date   NA 140 03/13/2022   K 4.2 03/13/2022   CO2 26 03/13/2022   GLUCOSE 98 03/13/2022   BUN 20 03/13/2022   CREATININE 0.64 03/13/2022   BILITOT 0.8 03/13/2022   ALKPHOS 86 03/13/2022   AST 17 03/13/2022   ALT 5 03/13/2022    PROT 7.3 03/13/2022   ALBUMIN 4.2 03/13/2022   CALCIUM 9.1 03/13/2022   ANIONGAP 5 03/13/2022   EGFR 100 02/02/2022   GFR 97.60 05/15/2020   Lab Results  Component Value Date   CHOL 141 02/02/2022   Lab Results  Component Value Date   HDL 58 02/02/2022   Lab Results  Component Value Date   LDLCALC 69 02/02/2022   Lab Results  Component Value Date   TRIG 67 02/02/2022   Lab Results  Component Value Date   CHOLHDL 2 03/10/2021   Lab Results  Component Value Date   HGBA1C 6.1 (H) 02/02/2022      Assessment & Plan:   Problem List Items Addressed This Visit       Other   Prediabetes - Primary    Reviewed last a1c Continue to work on diabetic diet       Functional tremor    Continue propanolol and f/u with neurology as scheduled      Bilateral lower extremity pain    Ordering ABI pending results  R/o PAD , suspected  If negative will refer to vascular for w/u for intermittent claudification       Relevant Orders   VAS [place] ABI WITH/WO TBI    No orders of the defined  types were placed in this encounter.   Follow-up: Return in about 3 months (around 07/30/2022) for f/u CPE, schedule with me .    Eugenia Pancoast, FNP

## 2022-05-03 ENCOUNTER — Encounter (INDEPENDENT_AMBULATORY_CARE_PROVIDER_SITE_OTHER): Payer: Self-pay | Admitting: Family Medicine

## 2022-05-03 ENCOUNTER — Ambulatory Visit (INDEPENDENT_AMBULATORY_CARE_PROVIDER_SITE_OTHER): Payer: Commercial Managed Care - HMO | Admitting: Family Medicine

## 2022-05-03 VITALS — BP 106/73 | HR 60 | Temp 98.4°F | Ht 64.0 in | Wt 252.0 lb

## 2022-05-03 DIAGNOSIS — Z9189 Other specified personal risk factors, not elsewhere classified: Secondary | ICD-10-CM

## 2022-05-03 DIAGNOSIS — E669 Obesity, unspecified: Secondary | ICD-10-CM | POA: Diagnosis not present

## 2022-05-03 DIAGNOSIS — E559 Vitamin D deficiency, unspecified: Secondary | ICD-10-CM | POA: Diagnosis not present

## 2022-05-03 DIAGNOSIS — R7303 Prediabetes: Secondary | ICD-10-CM | POA: Diagnosis not present

## 2022-05-03 DIAGNOSIS — R638 Other symptoms and signs concerning food and fluid intake: Secondary | ICD-10-CM | POA: Insufficient documentation

## 2022-05-03 DIAGNOSIS — F17210 Nicotine dependence, cigarettes, uncomplicated: Secondary | ICD-10-CM

## 2022-05-03 DIAGNOSIS — Z6841 Body Mass Index (BMI) 40.0 and over, adult: Secondary | ICD-10-CM

## 2022-05-03 DIAGNOSIS — F17200 Nicotine dependence, unspecified, uncomplicated: Secondary | ICD-10-CM

## 2022-05-03 MED ORDER — VITAMIN D (ERGOCALCIFEROL) 1.25 MG (50000 UNIT) PO CAPS: 50000.0000 [IU] | ORAL_CAPSULE | ORAL | 0 refills | Status: DC

## 2022-05-03 MED ORDER — METFORMIN HCL 500 MG PO TABS: ORAL_TABLET | ORAL | 0 refills | Status: DC

## 2022-05-05 ENCOUNTER — Ambulatory Visit
Admission: RE | Admit: 2022-05-05 | Discharge: 2022-05-05 | Disposition: A | Discharge status: Home / Self Care | Payer: Commercial Managed Care - HMO | Source: Ambulatory Visit | Attending: Student | Admitting: Student

## 2022-05-05 DIAGNOSIS — R2689 Other abnormalities of gait and mobility: Secondary | ICD-10-CM

## 2022-05-10 NOTE — Progress Notes (Unsigned)
Chief Complaint:   OBESITY Kristina Savage is here to discuss her progress with her obesity treatment plan along with follow-up of her obesity related diagnoses. Kristina Savage is on the Category 2 Plan with 6 oz protein at lunch and 8-10 oz at dinner and states she is following her eating plan approximately 90% of the time. Kristina Savage states she is walking 30 minutes 6 times per week.  Today's visit was #: 6 Starting weight: 282 lbs Starting date: 02/02/2022 Today's weight: 252 lbs Today's date: 05/03/2022 Total lbs lost to date: 30 Total lbs lost since last in-office visit: 4  Interim History: Kristina Savage is doing better with staying on plan and eating all the foods. She still has significant cravings for foods and carbs she see's on tv.  Subjective:   1. Vitamin D deficiency Kristina Savage is tolerating medication(s) well without side effects.  Medication compliance is good as patient endorses taking it as prescribed.  The patient denies additional concerns regarding this condition.      2. Prediabetes Kristina Savage has cravings for sweets and carbs (cookies, chips, candy, cakes).  3. Tobacco use disorder Pt smokes 1/2 pack of cigarettes per day; probably 20 pack a year history.  4. At risk for side effect of medication Kristina Savage is at risk for side effects of medication due to starting a new medication.  Assessment/Plan:  No orders of the defined types were placed in this encounter.   Medications Discontinued During This Encounter  Medication Reason   Vitamin D, Ergocalciferol, (DRISDOL) 1.25 MG (50000 UNIT) CAPS capsule Reorder     Meds ordered this encounter  Medications   Vitamin D, Ergocalciferol, (DRISDOL) 1.25 MG (50000 UNIT) CAPS capsule    Sig: Take 1 capsule (50,000 Units total) by mouth every 7 (seven) days.    Dispense:  4 capsule    Refill:  0    30 d supply;  ** OV for RF **   Do not send RF request   metFORMIN (GLUCOPHAGE) 500 MG tablet    Sig: 1/2 po with breakfast daily    Dispense:  30  tablet    Refill:  0    ** OV for RF **   Do not send RF request     1. Vitamin D deficiency - I again reiterated the importance of vitamin D (as well as calcium) to their health and wellbeing.  - I reviewed possible symptoms of low Vitamin D:  low energy, depressed mood, muscle aches, joint aches, osteoporosis etc. - low Vitamin D levels may be linked to an increased risk of cardiovascular events and even increased risk of cancers- such as colon and breast.  - ideal vitamin D levels reviewed with patient  - I recommend pt take a 50,000 IU weekly prescription vit D - see script below   - Informed patient this may be a lifelong thing, and she was encouraged to continue to take the medicine until told otherwise.    - weight loss will likely improve availability of vitamin D, thus encouraged Kristina Savage to continue with meal plan and their weight loss efforts to further improve this condition.  Thus, we will need to monitor levels regularly (every 3-4 mo on average) to keep levels within normal limits and prevent over supplementation. - pt's questions and concerns regarding this condition addressed.  Refill- Vitamin D, Ergocalciferol, (DRISDOL) 1.25 MG (50000 UNIT) CAPS capsule; Take 1 capsule (50,000 Units total) by mouth every 7 (seven) days.  Dispense: 4 capsule;  Refill: 0  2. Prediabetes Kristina Savage will continue to work on weight loss, exercise, and decreasing simple carbohydrates to help decrease the risk of diabetes.  Start 1/2 tab of Metformin 500 mg with food in the morning. Handouts on Metformin given to pt after long discussion with pt on use of medicine and potential risks.  Start- metFORMIN (GLUCOPHAGE) 500 MG tablet; 1/2 po with breakfast daily  Dispense: 30 tablet; Refill: 0  3. Tobacco use disorder Pt has nicotine patches to use and when she establishes a quit date, we will consider Wellbutrin to help her.  4. At risk for side effect of medication Due to Kristina Savage's current conditions and  medications, she is at a higher risk for drug side effect.  At least 15 minutes was spent on counseling her about these concerns today.  We discussed the benefits and potential risks of these medications, and all of patient's concerns were addressed and questions were answered.  she will call us, or their PCP or other specialists who treat their conditions with medications, with any questions or concerns that may develop.    5. Obesity, current BMI 43.4 Kristina Savage is currently in the action stage of change. As such, her goal is to continue with weight loss efforts. She has agreed to the Category 2 Plan with 6 oz protein at lunch and 8-10 oz at dinner.   Start Metformin. Increase protein and fiber. Decrease simple carb.  Exercise goals:  As is  Behavioral modification strategies: increasing lean protein intake and decreasing simple carbohydrates.  Kristina Savage has agreed to follow-up with our clinic in 3 weeks. She was informed of the importance of frequent follow-up visits to maximize her success with intensive lifestyle modifications for her multiple health conditions.   Objective:   Blood pressure 106/73, pulse 60, temperature 98.4 F (36.9 C), height '5\' 4"'$  (1.626 m), weight 252 lb (114.3 kg), SpO2 98 %. Body mass index is 43.26 kg/m.  General: Cooperative, alert, well developed, in no acute distress. HEENT: Conjunctivae and lids unremarkable. Cardiovascular: Regular rhythm.  Lungs: Normal work of breathing. Neurologic: No focal deficits.   Lab Results  Component Value Date   CREATININE 0.64 03/13/2022   BUN 20 03/13/2022   NA 140 03/13/2022   K 4.2 03/13/2022   CL 109 03/13/2022   CO2 26 03/13/2022   Lab Results  Component Value Date   ALT 5 03/13/2022   AST 17 03/13/2022   ALKPHOS 86 03/13/2022   BILITOT 0.8 03/13/2022   Lab Results  Component Value Date   HGBA1C 6.1 (H) 02/02/2022   HGBA1C 6.2 05/15/2020   HGBA1C 5.8 08/07/2013   Lab Results  Component Value Date   INSULIN  21.2 02/02/2022   Lab Results  Component Value Date   TSH 2.030 02/02/2022   Lab Results  Component Value Date   CHOL 141 02/02/2022   HDL 58 02/02/2022   LDLCALC 69 02/02/2022   TRIG 67 02/02/2022   CHOLHDL 2 03/10/2021   Lab Results  Component Value Date   VD25OH 34.5 02/02/2022   Lab Results  Component Value Date   WBC 5.3 03/13/2022   HGB 14.0 03/13/2022   HCT 44.5 03/13/2022   MCV 87.3 03/13/2022   PLT 202 03/13/2022    Attestation Statements:   Reviewed by clinician on day of visit: allergies, medications, problem list, medical history, surgical history, family history, social history, and previous encounter notes.  I, Kathlene November, BS, CMA, am acting as transcriptionist for [employer], DO.  I have reviewed the above documentation for accuracy and completeness, and I agree with the above. Marjory Sneddon, D.O.  The [place] was signed into law in 2016 which includes the topic of electronic health records.  This provides immediate access to information in MyChart.  This includes consultation notes, operative notes, office notes, lab results and pathology reports.  If you have any questions about what you read please let us know at your next visit so we can discuss your concerns and take corrective action if need be.  We are right here with you.

## 2022-05-17 ENCOUNTER — Telehealth: Payer: Self-pay | Admitting: Family

## 2022-05-17 NOTE — Telephone Encounter (Signed)
Noted  

## 2022-05-17 NOTE — Telephone Encounter (Signed)
Patient was seen on 10/5 with Tabitha,she stated a referral was suppose to be sent out to vein and vascular,but she haven't heard back form anyone.

## 2022-05-17 NOTE — Telephone Encounter (Signed)
From prior note:  Ordering ABI pending results  R/o PAD , suspected  If negative will refer to vascular for w/u for intermittent claudification   In a nutshell, pt had ABI's ordered, but I do not yet see that this has been completed. Has she scheduled her u/s yet ? Once this u/s is completed I will refer to vascular. I am ruling out peripheral arterial disease.

## 2022-05-17 NOTE — Telephone Encounter (Signed)
Spoke with [facility] and they have the order.   They are able to see the patient tomorrow 05/18/2022 at 3pm, arrive by 2:45pm to check in.   [facility] Address: [address], [place] [address] Phone: [phone]  Left a detailed message for the patient with appt information.   Mychart message sent to patient as well with appt information.

## 2022-05-18 ENCOUNTER — Ambulatory Visit (INDEPENDENT_AMBULATORY_CARE_PROVIDER_SITE_OTHER): Payer: Commercial Managed Care - HMO

## 2022-05-18 DIAGNOSIS — M79604 Pain in right leg: Secondary | ICD-10-CM | POA: Diagnosis not present

## 2022-05-18 DIAGNOSIS — M79605 Pain in left leg: Secondary | ICD-10-CM | POA: Diagnosis not present

## 2022-05-24 ENCOUNTER — Encounter (INDEPENDENT_AMBULATORY_CARE_PROVIDER_SITE_OTHER): Payer: Self-pay

## 2022-05-24 ENCOUNTER — Ambulatory Visit (INDEPENDENT_AMBULATORY_CARE_PROVIDER_SITE_OTHER): Payer: Commercial Managed Care - HMO | Admitting: Family Medicine

## 2022-05-24 ENCOUNTER — Encounter (INDEPENDENT_AMBULATORY_CARE_PROVIDER_SITE_OTHER): Payer: Self-pay | Admitting: Family Medicine

## 2022-05-24 VITALS — BP 125/84 | HR 107 | Temp 98.4°F | Ht 64.0 in | Wt 239.0 lb

## 2022-05-24 DIAGNOSIS — R7303 Prediabetes: Secondary | ICD-10-CM

## 2022-05-24 DIAGNOSIS — Z6841 Body Mass Index (BMI) 40.0 and over, adult: Secondary | ICD-10-CM | POA: Diagnosis not present

## 2022-05-24 DIAGNOSIS — E669 Obesity, unspecified: Secondary | ICD-10-CM | POA: Diagnosis not present

## 2022-05-24 DIAGNOSIS — E559 Vitamin D deficiency, unspecified: Secondary | ICD-10-CM | POA: Diagnosis not present

## 2022-05-24 MED ORDER — METFORMIN HCL 500 MG PO TABS: ORAL_TABLET | ORAL | 0 refills | Status: DC

## 2022-05-24 MED ORDER — VITAMIN D (ERGOCALCIFEROL) 1.25 MG (50000 UNIT) PO CAPS: 50000.0000 [IU] | ORAL_CAPSULE | ORAL | 0 refills | Status: DC

## 2022-05-26 ENCOUNTER — Ambulatory Visit (INDEPENDENT_AMBULATORY_CARE_PROVIDER_SITE_OTHER): Payer: Commercial Managed Care - HMO | Admitting: Family Medicine

## 2022-06-03 NOTE — Progress Notes (Signed)
Chief Complaint:   OBESITY Kristina Savage is here to discuss her progress with her obesity treatment plan along with follow-up of her obesity related diagnoses. Kristina Savage is on the Category 2 Plan with breakfast and lunch options and states she is following her eating plan approximately 50% of the time. Kristina Savage states she is walking 30 minutes 3-5 times per week.  Today's visit was #: 7 Starting weight: 282 lbs Starting date: 02/02/2022 Today's weight: 239 lbs Today's date: 05/24/2022 Total lbs lost to date: 43 Total lbs lost since last in-office visit: 13  Interim History: Kristina Savage had a GI bug for a week and just got over it 3 days ago. She couldn't eat much but tried to eat proteins. She gained in muscle mass and lost almost 20 lbs in fat mass.  Subjective:   1. Prediabetes Kristina Savage started Metformin 1/2 tab in morning after last OV. She is tolerating it well without GI side effects. Pt did not take Metformin while she was sick with GI bug. She thinks it helped decrease snacking in the afternoons, but she is snacking after dinner.  2. Vitamin D deficiency Pt last Vit D level was 34.5.  Assessment/Plan:  No orders of the defined types were placed in this encounter.   Medications Discontinued During This Encounter  Medication Reason   Vitamin D, Ergocalciferol, (DRISDOL) 1.25 MG (50000 UNIT) CAPS capsule Reorder   metFORMIN (GLUCOPHAGE) 500 MG tablet Reorder     Meds ordered this encounter  Medications   Vitamin D, Ergocalciferol, (DRISDOL) 1.25 MG (50000 UNIT) CAPS capsule    Sig: Take 1 capsule (50,000 Units total) by mouth every 7 (seven) days.    Dispense:  4 capsule    Refill:  0    30 d supply;  ** OV for RF **   Do not send RF request   metFORMIN (GLUCOPHAGE) 500 MG tablet    Sig: 1/2 po with meals twice daily    Dispense:  30 tablet    Refill:  0    ** OV for RF **   Do not send RF request     1. Prediabetes Jonique will continue to work on weight loss, exercise, prudent  nutritional plan, and decreasing simple carbohydrates to help decrease the risk of diabetes.  Increase Metformin to 250 mg twice a day.  Increase & Refill- metFORMIN (GLUCOPHAGE) 500 MG tablet; 1/2 po with meals twice daily  Dispense: 30 tablet; Refill: 0  2. Vitamin D deficiency Low Vitamin D level contributes to fatigue and are associated with obesity, breast, and colon cancer. She agrees to continue to take prescription Vitamin D '@50'$ ,000 IU every week and will follow-up for routine testing of Vitamin D, at least 2-3 times per year to avoid over-replacement. Needs recheck of labs in the near future.  Refill- Vitamin D, Ergocalciferol, (DRISDOL) 1.25 MG (50000 UNIT) CAPS capsule; Take 1 capsule (50,000 Units total) by mouth every 7 (seven) days.  Dispense: 4 capsule; Refill: 0  3. Obesity,current BMI 41.0 Kristina Savage is currently in the action stage of change. As such, her goal is to continue with weight loss efforts. She has agreed to the Category 2 Plan with breakfast and lunch options.   Handout: Holiday Eating Strategies  Exercise goals: For substantial health benefits, adults should do at least 150 minutes (2 hours and 30 minutes) a week of moderate-intensity, or 75 minutes (1 hour and 15 minutes) a week of vigorous-intensity aerobic physical activity, or an equivalent combination of  moderate- and vigorous-intensity aerobic activity. Aerobic activity should be performed in episodes of at least 10 minutes, and preferably, it should be spread throughout the week.  Behavioral modification strategies: holiday eating strategies .  Kristina Savage has agreed to follow-up with our clinic in 3-4 weeks, fasting for B12, Vit D, A1c, and fasting insulin. She was informed of the importance of frequent follow-up visits to maximize her success with intensive lifestyle modifications for her multiple health conditions.   Objective:   Blood pressure 125/84, pulse (!) 107, temperature 98.4 F (36.9 C), height '5\' 4"'$   (1.626 m), weight 239 lb (108.4 kg), SpO2 95 %. Body mass index is 41.02 kg/m.  General: Cooperative, alert, well developed, in no acute distress. HEENT: Conjunctivae and lids unremarkable. Cardiovascular: Regular rhythm.  Lungs: Normal work of breathing. Neurologic: No focal deficits.   Lab Results  Component Value Date   CREATININE 0.64 03/13/2022   BUN 20 03/13/2022   NA 140 03/13/2022   K 4.2 03/13/2022   CL 109 03/13/2022   CO2 26 03/13/2022   Lab Results  Component Value Date   ALT 5 03/13/2022   AST 17 03/13/2022   ALKPHOS 86 03/13/2022   BILITOT 0.8 03/13/2022   Lab Results  Component Value Date   HGBA1C 6.1 (H) 02/02/2022   HGBA1C 6.2 05/15/2020   HGBA1C 5.8 08/07/2013   Lab Results  Component Value Date   INSULIN 21.2 02/02/2022   Lab Results  Component Value Date   TSH 2.030 02/02/2022   Lab Results  Component Value Date   CHOL 141 02/02/2022   HDL 58 02/02/2022   LDLCALC 69 02/02/2022   TRIG 67 02/02/2022   CHOLHDL 2 03/10/2021   Lab Results  Component Value Date   VD25OH 34.5 02/02/2022   Lab Results  Component Value Date   WBC 5.3 03/13/2022   HGB 14.0 03/13/2022   HCT 44.5 03/13/2022   MCV 87.3 03/13/2022   PLT 202 03/13/2022    Attestation Statements:   Reviewed by clinician on day of visit: allergies, medications, problem list, medical history, surgical history, family history, social history, and previous encounter notes.  I, Kathlene November, BS, CMA, am acting as transcriptionist for [employer], DO.   I have reviewed the above documentation for accuracy and completeness, and I agree with the above. Marjory Sneddon, D.O.  The [place] was signed into law in 2016 which includes the topic of electronic health records.  This provides immediate access to information in MyChart.  This includes consultation notes, operative notes, office notes, lab results and pathology reports.  If you have any questions about  what you read please let us know at your next visit so we can discuss your concerns and take corrective action if need be.  We are right here with you.

## 2022-06-08 ENCOUNTER — Ambulatory Visit (INDEPENDENT_AMBULATORY_CARE_PROVIDER_SITE_OTHER): Payer: Commercial Managed Care - HMO | Admitting: Family

## 2022-06-08 ENCOUNTER — Encounter: Payer: Self-pay | Admitting: Family

## 2022-06-08 VITALS — BP 108/68 | HR 76 | Temp 98.3°F | Resp 16 | Ht 64.0 in | Wt 247.1 lb

## 2022-06-08 DIAGNOSIS — L0201 Cutaneous abscess of face: Secondary | ICD-10-CM | POA: Diagnosis not present

## 2022-06-08 MED ORDER — DOXYCYCLINE HYCLATE 100 MG PO TABS: 100.0000 mg | ORAL_TABLET | Freq: Two times a day (BID) | ORAL | 0 refills | Status: AC

## 2022-06-08 NOTE — Progress Notes (Signed)
Established Patient Office Visit  Subjective:  Patient ID: Kristina Savage, female    DOB: Nov 07, 1960  Age: 61 y.o. MRN: [id number]  CC:  Chief Complaint  Patient presents with   Facial Swelling    Right side since Sunday. Painful     HPI Kristina Savage is here today with concerns.   Right side of face swollen since Sunday, she tends to pick at her face ,and she does pick it and since has been a bit more swollen. She covered her right side of her face with concealer and thinks she irritated it more. Tenderness on right upper temple and right cheek as well.   Past Medical History:  Diagnosis Date   Anxiety    B12 deficiency    Chicken pox    Depression    Diverticulitis    Dizziness    Genital warts    GERD (gastroesophageal reflux disease)    Hay fever    Hyperlipidemia    Ovarian cyst    Parkinson's disease    Dr.Shaw [place] and at [facility].  Functional/neurological condition.   Poor balance    Pre-diabetes    Sleep apnea    Stroke [facility])    UTI (lower urinary tract infection)    Vitamin D deficiency     Past Surgical History:  Procedure Laterality Date   CERVIX REMOVAL     Laser surgery, not complete removal   CESAREAN SECTION     CHONDROPLASTY Right 06/11/2020   Procedure: CHONDROPLASTY;  Surgeon: Netta Cedars, MD;  Location: [facility];  Service: Orthopedics;  Laterality: Right;   KNEE ARTHROSCOPY WITH MEDIAL MENISECTOMY Right 06/11/2020   Procedure: KNEE ARTHROSCOPY WITH MEDIAL MENISECTOMY;  Surgeon: Netta Cedars, MD;  Location: [facility];  Service: Orthopedics;  Laterality: Right;    Family History  Problem Relation Age of Onset   Arthritis Mother    Hypertension Mother    COPD Mother    Heart failure Mother    Heart disease Mother    Depression Mother    Eating disorder Mother    Obesity Mother    Arthritis Father    Thyroid disease Father        thinks cancer   Parkinson's disease Father    Alcohol abuse Sister     Breast cancer Maternal Grandmother    Diabetes Paternal Grandmother    Parkinson's disease Paternal Grandmother    Diabetes Son    Cancer Maternal Aunt        Female cancer - not sure what   Breast cancer Maternal Aunt    Prostate cancer Maternal Uncle    Colon cancer Maternal Uncle 44    Social History   Socioeconomic History   Marital status: Married    Spouse name: [name]   Number of children: 4   Years of education: High school   Highest education level: Not on file  Occupational History   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  Tobacco Use   Smoking status: Every Day    Packs/day: 0.25    Years: 30.00    Total pack years: 7.50    Types: Cigarettes    Passive exposure: Past   Smokeless tobacco: Never  Vaping Use   Vaping Use: Not on file  Substance and Sexual Activity   Alcohol use: Not Currently    Alcohol/week: 0.0 standard drinks of alcohol    Comment: once a year   Drug use: No  Sexual activity: Yes    Birth control/protection: Post-menopausal  Other Topics Concern   Not on file  Social History Narrative   04/24/20   From: [place] - moved to [place] to raise her family   Living: with husband, [name] (1991)         Family: 7 children - including step children - twin boys nearby and daughter nearby - 10 grandchildren      Enjoys: spend time with family      Exercise: knee limiting exercise - tries to walk   Diet: eats whatever      Safety   Seat belts: Yes    Guns: Yes  and secure   Safe in relationships: Yes    Social Determinants of Health   Financial Resource Strain: Not on file  Food Insecurity: Not on file  Transportation Needs: Not on file  Physical Activity: Not on file  Stress: Not on file  Social Connections: Not on file  Intimate Partner Violence: Not on file    Outpatient Medications Prior to Visit  Medication Sig Dispense Refill   aspirin EC 81 MG tablet Take 81 mg by mouth daily. Swallow whole.     atorvastatin (LIPITOR)  10 MG tablet TAKE 1 TABLET BY MOUTH EVERY DAY 90 tablet 3   clonazePAM (KLONOPIN) 0.5 MG tablet Take 0.5-1 tablets (0.25-0.5 mg total) by mouth as directed. Take half to 1 tablet 1-2 times a week for severe anxiety, agitation 7 tablet 1   cyanocobalamin (CYANOCOBALAMIN) 500 MCG tablet 200-548mg QD     DULoxetine (CYMBALTA) 30 MG capsule Take 1 capsule (30 mg total) by mouth 2 (two) times daily. 180 capsule 0   metFORMIN (GLUCOPHAGE) 500 MG tablet 1/2 po with meals twice daily 30 tablet 0   nicotine (NICODERM CQ - DOSED IN MG/24 HOURS) 14 mg/24hr patch Place 1 patch (14 mg total) onto the skin daily. 28 patch 0   propranolol (INDERAL) 10 MG tablet TAKE 1 TABLET (10 MG TOTAL) BY MOUTH 3 (THREE) TIMES DAILY AS NEEDED. FOR SEVERE ANXIETY 270 tablet 0   Vitamin D, Ergocalciferol, (DRISDOL) 1.25 MG (50000 UNIT) CAPS capsule Take 1 capsule (50,000 Units total) by mouth every 7 (seven) days. 4 capsule 0   carbidopa-levodopa (SINEMET IR) 25-100 MG tablet Take 2 tablets by mouth 3 (three) times daily.     No facility-administered medications prior to visit.    Allergies  Allergen Reactions   Oxycodone-Acetaminophen Nausea And Vomiting   Percocet [Oxycodone-Acetaminophen] Nausea And Vomiting   Pollen Extract     Nasal congestion   Prozac [Fluoxetine] Nausea Only        Objective:    Physical Exam Constitutional:      Appearance: Normal appearance. She is obese.  Pulmonary:     Effort: Pulmonary effort is normal.  Skin:    Comments: Raised tender erythematic abscess with dry scaled scab. Dried yellow discharge.   Neurological:     Mental Status: She is alert.     BP 108/68   Pulse 76   Temp 98.3 F (36.8 C)   Resp 16   Ht _0  (1.626 m)   Wt 247 lb 2 oz (112.1 kg)   SpO2 98%   BMI 42.42 kg/m  Wt Readings from Last 3 Encounters:  06/08/22 247 lb 2 oz (112.1 kg)  05/24/22 239 lb (108.4 kg)  05/03/22 252 lb (114.3 kg)     Health Maintenance Due  Topic Date Due   COVID-19  Vaccine (  4 - Moderna risk series) 09/09/2020    There are no preventive care reminders to display for this patient.  Lab Results  Component Value Date   TSH 2.030 02/02/2022   Lab Results  Component Value Date   WBC 5.3 03/13/2022   HGB 14.0 03/13/2022   HCT 44.5 03/13/2022   MCV 87.3 03/13/2022   PLT 202 03/13/2022   Lab Results  Component Value Date   NA 140 03/13/2022   K 4.2 03/13/2022   CO2 26 03/13/2022   GLUCOSE 98 03/13/2022   BUN 20 03/13/2022   CREATININE 0.64 03/13/2022   BILITOT 0.8 03/13/2022   ALKPHOS 86 03/13/2022   AST 17 03/13/2022   ALT 5 03/13/2022   PROT 7.3 03/13/2022   ALBUMIN 4.2 03/13/2022   CALCIUM 9.1 03/13/2022   ANIONGAP 5 03/13/2022   EGFR 100 02/02/2022   GFR 97.60 05/15/2020   Lab Results  Component Value Date   HGBA1C 6.1 (H) 02/02/2022      Assessment & Plan:   Problem List Items Addressed This Visit       Other   Abscess of face - Primary    RX doxycycline 100 mg po bid x 10 days Pt advised of the following:  Please monitor site for worsening signs/symptoms of infection to include: increasing redness, increasing tenderness, increase in size, and or pustulant drainage from site. If this is to occur please let me know immediately.        Relevant Medications   doxycycline (VIBRA-TABS) 100 MG tablet    Meds ordered this encounter  Medications   doxycycline (VIBRA-TABS) 100 MG tablet    Sig: Take 1 tablet (100 mg total) by mouth 2 (two) times daily for 10 days.    Dispense:  20 tablet    Refill:  0    Order Specific Question:   Supervising Provider    Answer:   Diona Browner, AMY E [[id number]]    Follow-up: Return in about 1 week (around 06/15/2022) for prn f/u wound if no improvement and or worsening of symptoms .    Eugenia Pancoast, FNP

## 2022-06-08 NOTE — Assessment & Plan Note (Signed)
RX doxycycline 100 mg po bid x 10 days Pt advised of the following:  Please monitor site for worsening signs/symptoms of infection to include: increasing redness, increasing tenderness, increase in size, and or pustulant drainage from site. If this is to occur please let me know immediately.

## 2022-06-08 NOTE — Patient Instructions (Signed)
  Please monitor site for worsening signs/symptoms of infection to include: increasing redness, increasing tenderness, increase in size, and or pustulant drainage from site. If this is to occur please let me know immediately.    Regards,   Eugenia Pancoast FNP-C

## 2022-06-27 ENCOUNTER — Other Ambulatory Visit (INDEPENDENT_AMBULATORY_CARE_PROVIDER_SITE_OTHER): Payer: Self-pay | Admitting: Family Medicine

## 2022-06-27 DIAGNOSIS — R7303 Prediabetes: Secondary | ICD-10-CM

## 2022-06-30 ENCOUNTER — Ambulatory Visit (INDEPENDENT_AMBULATORY_CARE_PROVIDER_SITE_OTHER): Payer: Commercial Managed Care - HMO | Admitting: Family Medicine

## 2022-06-30 ENCOUNTER — Encounter (INDEPENDENT_AMBULATORY_CARE_PROVIDER_SITE_OTHER): Payer: Self-pay | Admitting: Family Medicine

## 2022-06-30 VITALS — BP 124/83 | HR 71 | Temp 97.5°F | Ht 64.0 in | Wt 232.4 lb

## 2022-06-30 DIAGNOSIS — E669 Obesity, unspecified: Secondary | ICD-10-CM

## 2022-06-30 DIAGNOSIS — E559 Vitamin D deficiency, unspecified: Secondary | ICD-10-CM | POA: Diagnosis not present

## 2022-06-30 DIAGNOSIS — R7303 Prediabetes: Secondary | ICD-10-CM

## 2022-06-30 DIAGNOSIS — F17210 Nicotine dependence, cigarettes, uncomplicated: Secondary | ICD-10-CM

## 2022-06-30 DIAGNOSIS — E538 Deficiency of other specified B group vitamins: Secondary | ICD-10-CM | POA: Insufficient documentation

## 2022-06-30 DIAGNOSIS — Z6839 Body mass index (BMI) 39.0-39.9, adult: Secondary | ICD-10-CM

## 2022-06-30 DIAGNOSIS — G20A1 Parkinson's disease without dyskinesia, without mention of fluctuations: Secondary | ICD-10-CM | POA: Diagnosis not present

## 2022-06-30 DIAGNOSIS — Z720 Tobacco use: Secondary | ICD-10-CM

## 2022-06-30 MED ORDER — VITAMIN D (ERGOCALCIFEROL) 1.25 MG (50000 UNIT) PO CAPS: 50000.0000 [IU] | ORAL_CAPSULE | ORAL | 0 refills | Status: DC

## 2022-07-01 ENCOUNTER — Other Ambulatory Visit: Payer: Self-pay | Admitting: Psychiatry

## 2022-07-01 ENCOUNTER — Other Ambulatory Visit (INDEPENDENT_AMBULATORY_CARE_PROVIDER_SITE_OTHER): Payer: Self-pay | Admitting: Family Medicine

## 2022-07-01 DIAGNOSIS — F411 Generalized anxiety disorder: Secondary | ICD-10-CM

## 2022-07-01 DIAGNOSIS — F331 Major depressive disorder, recurrent, moderate: Secondary | ICD-10-CM

## 2022-07-01 DIAGNOSIS — R7303 Prediabetes: Secondary | ICD-10-CM

## 2022-07-01 DIAGNOSIS — F4010 Social phobia, unspecified: Secondary | ICD-10-CM

## 2022-07-01 LAB — HEMOGLOBIN A1C
Est. average glucose Bld gHb Est-mCnc: 126 mg/dL
Hgb A1c MFr Bld: 6 % — ABNORMAL HIGH (ref 4.8–5.6)

## 2022-07-01 LAB — VITAMIN B12: Vitamin B-12: 443 pg/mL (ref 232–1245)

## 2022-07-01 LAB — VITAMIN D 25 HYDROXY (VIT D DEFICIENCY, FRACTURES): Vit D, 25-Hydroxy: 54.4 ng/mL (ref 30.0–100.0)

## 2022-07-14 ENCOUNTER — Ambulatory Visit: Payer: Commercial Managed Care - HMO | Admitting: Psychiatry

## 2022-07-14 NOTE — Progress Notes (Signed)
Chief Complaint:   OBESITY Kristina Savage is here to discuss her progress with her obesity treatment plan along with follow-up of her obesity related diagnoses. Kristina Savage is on the Category 2 Plan with breakfast and lunch options and states she is following her eating plan approximately 50-60% of the time. Kristina Savage states she is not exercising.  Today's visit was #: 8 Starting weight: 282 lbs Starting date: 02/02/2022 Today's weight: 232 lbs Today's date: 06/30/2022 Total lbs lost to date: 50 lbs Total lbs lost since last in-office visit: 7 lbs  Interim History: Patient did very well over thanksgiving day.  Last office visit 05/24/2022. Had son's wedding.   Subjective:   1. Prediabetes Metformin causing GI upset, positive for nausea and occasional loose stools.  2. B12 deficiency Taking B12 via her multivitamin with at least 200 mcg daily.  3. Vitamin D deficiency Last vitamin D level was 34.5.  Patient not at goal.  4. Parkinson's disease, unspecified whether dyskinesia present, unspecified whether manifestations fluctuate Patient sees Dr. Brigitte Pulse in [place] (Neurology).  She recently had a flare in symptoms, positive for tremors, dizziness and has been unable to exercise.  5. Tobacco abuse She continues to cut her cigarette use in half.  She does not have a quit date yet.  Not ready to commit to that.   Assessment/Plan:   Orders Placed This Encounter  Procedures   VITAMIN D 25 Hydroxy (Vit-D Deficiency, Fractures)   Vitamin B12   Hemoglobin A1c    Medications Discontinued During This Encounter  Medication Reason   metFORMIN (GLUCOPHAGE) 500 MG tablet Side effect (s)   Vitamin D, Ergocalciferol, (DRISDOL) 1.25 MG (50000 UNIT) CAPS capsule Reorder     Meds ordered this encounter  Medications   Vitamin D, Ergocalciferol, (DRISDOL) 1.25 MG (50000 UNIT) CAPS capsule    Sig: Take 1 capsule (50,000 Units total) by mouth every 7 (seven) days.    Dispense:  4 capsule    Refill:   0    30 d supply;  ** OV for RF **   Do not send RF request     1. Prediabetes Discontinue metformin due to intolerance even at lowest doses.  Check labs today.  - Hemoglobin A1c  2. B12 deficiency Check labs today.  Continue B12 supplement.  Energy slowly improving.  Continue PNP and weight loss plan.  - Vitamin B12  3. Vitamin D deficiency Low Vitamin D level contributes to fatigue and are associated with obesity, breast, and colon cancer. She agrees to continue to take prescription Vitamin D '@50'$ ,000 IU every week and will follow-up for routine testing of Vitamin D, at least 2-3 times per year to avoid over-replacement.  Check labs today.  Refill - Vitamin D, Ergocalciferol, (DRISDOL) 1.25 MG (50000 UNIT) CAPS capsule; Take 1 capsule (50,000 Units total) by mouth every 7 (seven) days.  Dispense: 4 capsule; Refill: 0  - VITAMIN D 25 Hydroxy (Vit-D Deficiency, Fractures)  4. Parkinson's disease, unspecified whether dyskinesia present, unspecified whether manifestations fluctuate Patient has upcoming appointment, chronic care per neuro.  Stress management discussed with patient.  Sees counselor and has upcoming appointment with psych doctor.    5. Tobacco abuse Encouraged to make a quit date.   6. Obesity,current BMI 39.9 Holiday eating discussed with patient.  Handouts given.   Kristina Savage is currently in the action stage of change. As such, her goal is to continue with weight loss efforts. She has agreed to the Category 2 Plan.  Exercise goals:  As is.   Behavioral modification strategies: holiday eating strategies , avoiding temptations, and planning for success.  Kristina Savage has agreed to follow-up with our clinic in 4 weeks. She was informed of the importance of frequent follow-up visits to maximize her success with intensive lifestyle modifications for her multiple health conditions.   Objective:   Blood pressure 124/83, pulse 71, temperature (!) 97.5 F (36.4 C), height '5\' 4"'$   (1.626 m), weight 232 lb 6.4 oz (105.4 kg), SpO2 98 %. Body mass index is 39.89 kg/m.  General: Cooperative, alert, well developed, in no acute distress. HEENT: Conjunctivae and lids unremarkable. Cardiovascular: Regular rhythm.  Lungs: Normal work of breathing. Neurologic: No focal deficits.   Lab Results  Component Value Date   CREATININE 0.64 03/13/2022   BUN 20 03/13/2022   NA 140 03/13/2022   K 4.2 03/13/2022   CL 109 03/13/2022   CO2 26 03/13/2022   Lab Results  Component Value Date   ALT 5 03/13/2022   AST 17 03/13/2022   ALKPHOS 86 03/13/2022   BILITOT 0.8 03/13/2022   Lab Results  Component Value Date   HGBA1C 6.0 (H) 06/30/2022   HGBA1C 6.1 (H) 02/02/2022   HGBA1C 6.2 05/15/2020   HGBA1C 5.8 08/07/2013   Lab Results  Component Value Date   INSULIN 21.2 02/02/2022   Lab Results  Component Value Date   TSH 2.030 02/02/2022   Lab Results  Component Value Date   CHOL 141 02/02/2022   HDL 58 02/02/2022   LDLCALC 69 02/02/2022   TRIG 67 02/02/2022   CHOLHDL 2 03/10/2021   Lab Results  Component Value Date   VD25OH 54.4 06/30/2022   VD25OH 34.5 02/02/2022   Lab Results  Component Value Date   WBC 5.3 03/13/2022   HGB 14.0 03/13/2022   HCT 44.5 03/13/2022   MCV 87.3 03/13/2022   PLT 202 03/13/2022   No results found for: "IRON", "TIBC", "FERRITIN"  Attestation Statements:   Reviewed by clinician on day of visit: allergies, medications, problem list, medical history, surgical history, family history, social history, and previous encounter notes.  I, Davy Pique, RMA, am acting as transcriptionist for [employer], DO.'   I have reviewed the above documentation for accuracy and completeness, and I agree with the above. Marjory Sneddon, D.O.  The [place] was signed into law in 2016 which includes the topic of electronic health records.  This provides immediate access to information in MyChart.  This includes consultation  notes, operative notes, office notes, lab results and pathology reports.  If you have any questions about what you read please let us know at your next visit so we can discuss your concerns and take corrective action if need be.  We are right here with you.

## 2022-08-02 ENCOUNTER — Encounter: Payer: Medicaid Other | Admitting: Family

## 2022-08-12 ENCOUNTER — Ambulatory Visit (INDEPENDENT_AMBULATORY_CARE_PROVIDER_SITE_OTHER): Payer: Commercial Managed Care - HMO | Admitting: Family Medicine

## 2022-08-13 ENCOUNTER — Other Ambulatory Visit: Payer: Self-pay | Admitting: Psychiatry

## 2022-08-13 DIAGNOSIS — F331 Major depressive disorder, recurrent, moderate: Secondary | ICD-10-CM

## 2022-08-13 DIAGNOSIS — F411 Generalized anxiety disorder: Secondary | ICD-10-CM

## 2022-08-13 DIAGNOSIS — F4010 Social phobia, unspecified: Secondary | ICD-10-CM

## 2022-08-16 ENCOUNTER — Telehealth: Payer: Self-pay | Admitting: Psychiatry

## 2022-08-16 NOTE — Telephone Encounter (Signed)
Received a prior authorization request for this patient, for duloxetine 30 mg.  Please assist.

## 2022-08-17 NOTE — Telephone Encounter (Signed)
went online to [email or web address] and submitted the prior auth . - pending 

## 2022-08-18 ENCOUNTER — Other Ambulatory Visit: Payer: Self-pay

## 2022-08-18 DIAGNOSIS — I679 Cerebrovascular disease, unspecified: Secondary | ICD-10-CM

## 2022-08-18 DIAGNOSIS — E782 Mixed hyperlipidemia: Secondary | ICD-10-CM

## 2022-08-18 NOTE — Telephone Encounter (Signed)
Please reschedule patient that no-showed the follow up. Please advise once scheduled.

## 2022-08-18 NOTE — Telephone Encounter (Signed)
Duplicate message. 

## 2022-08-19 NOTE — Telephone Encounter (Signed)
Lvtmcb, sent mychart message

## 2022-08-23 ENCOUNTER — Other Ambulatory Visit: Payer: Self-pay

## 2022-08-24 NOTE — Telephone Encounter (Signed)
LVM for patient to call back and schedule

## 2022-08-25 NOTE — Telephone Encounter (Signed)
Appt is scheduled for 08/30/22 with Tabitha.

## 2022-08-26 NOTE — Telephone Encounter (Signed)
This was approved and picked up on 08-23-22

## 2022-08-30 ENCOUNTER — Ambulatory Visit: Payer: Medicaid Other | Admitting: Family

## 2022-08-30 ENCOUNTER — Ambulatory Visit: Payer: Commercial Managed Care - HMO | Admitting: Psychiatry

## 2022-08-30 ENCOUNTER — Encounter: Payer: Self-pay | Admitting: Family

## 2022-08-30 VITALS — BP 118/78 | HR 68 | Temp 98.4°F | Ht 64.0 in | Wt 224.2 lb

## 2022-08-30 DIAGNOSIS — F39 Unspecified mood [affective] disorder: Secondary | ICD-10-CM

## 2022-08-30 DIAGNOSIS — M25531 Pain in right wrist: Secondary | ICD-10-CM | POA: Insufficient documentation

## 2022-08-30 DIAGNOSIS — E66812 Obesity, class 2: Secondary | ICD-10-CM | POA: Insufficient documentation

## 2022-08-30 DIAGNOSIS — Z6838 Body mass index (BMI) 38.0-38.9, adult: Secondary | ICD-10-CM

## 2022-08-30 DIAGNOSIS — Z720 Tobacco use: Secondary | ICD-10-CM | POA: Diagnosis not present

## 2022-08-30 DIAGNOSIS — E782 Mixed hyperlipidemia: Secondary | ICD-10-CM

## 2022-08-30 MED ORDER — NICOTINE 21 MG/24HR TD PT24: 21.0000 mg | MEDICATED_PATCH | Freq: Every day | TRANSDERMAL | 0 refills | Status: DC

## 2022-08-30 NOTE — Assessment & Plan Note (Addendum)
Continue with wearing wrist brace. Suspected carpal tunnel  F/u with neuro next week for eval

## 2022-08-30 NOTE — Assessment & Plan Note (Signed)
Stable. Continue atorvastatin work on low chol diet

## 2022-08-30 NOTE — Assessment & Plan Note (Signed)
Pt advised to work on diet and exercise as tolerated  

## 2022-08-30 NOTE — Patient Instructions (Addendum)
  Sending in nicotine patches 21 mg . Start patch, stop smoking.   Please return fasting at your lab appointment (meaning you can only drink black coffee and or water prior to your appointment).  Due to recent changes in healthcare laws, you may see results of your imaging and/or laboratory studies on MyChart before I have had a chance to review them.  I understand that in some cases there may be results that are confusing or concerning to you. Please understand that not all results are received at the same time and often I may need to interpret multiple results in order to provide you with the best plan of care or course of treatment. Therefore, I ask that you please give me 2 business days to thoroughly review all your results before contacting my office for clarification. Should we see a critical lab result, you will be contacted sooner.   It was a pleasure seeing you today! Please do not hesitate to reach out with any questions and or concerns.  Regards,   Eugenia Pancoast FNP-C

## 2022-08-30 NOTE — Progress Notes (Signed)
Established Patient Office Visit  Subjective:      CC:  Chief Complaint  Patient presents with   Medical Management of Chronic Issues    HPI: Kristina Savage is a 62 y.o. female presenting on 08/30/2022 for Medical Management of Chronic Issues . Seeing medication weight management, doing well.  No exercise routine however very active at home. Was walking a bit, needs to restart.  Wt Readings from Last 3 Encounters:  08/30/22 224 lb 3.2 oz (101.7 kg)  06/30/22 232 lb 6.4 oz (105.4 kg)  06/08/22 247 lb 2 oz (112.1 kg)   HLD: atorvastatin 10 mg nightly. Tolerating well, no myalgias.   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 She has old nictoine patches. Does smoke over 10 cigarettes daily.   Prediabetes:  Lab Results  Component Value Date   HGBA1C 6.0 (H) 06/30/2022   Bil lower ext pain, saw vascular and with negative workup. ABI unremarkable.   Right wrist pain, wearing a wrist brace. She states suspected carpal tunnel, has f/u with her neurologist this coming week.    Social history:  Relevant past medical, surgical, family and social history reviewed and updated as indicated. Interim medical history since our last visit reviewed.  Allergies and medications reviewed and updated.  DATA REVIEWED: CHART IN EPIC     ROS: Negative unless specifically indicated above in HPI.    Current Outpatient Medications:    aspirin EC 81 MG tablet, Take 81 mg by mouth daily. Swallow whole., Disp: , Rfl:    atorvastatin (LIPITOR) 10 MG tablet, TAKE 1 TABLET BY MOUTH EVERY DAY, Disp: 90 tablet, Rfl: 3   carbidopa-levodopa (SINEMET IR) 25-100 MG tablet, Take 2 tablets by mouth 3 (three) times daily., Disp: , Rfl:    clonazePAM (KLONOPIN) 0.5 MG tablet, Take 0.5-1 tablets (0.25-0.5 mg total) by mouth as directed. Take half to 1 tablet 1-2 times a week for severe anxiety, agitation, Disp: 7 tablet, Rfl: 1    cyanocobalamin (CYANOCOBALAMIN) 500 MCG tablet, 200-519mg QD, Disp: , Rfl:    DULoxetine (CYMBALTA) 30 MG capsule, TAKE 1 CAPSULE BY MOUTH 2 TIMES DAILY., Disp: 60 capsule, Rfl: 2   nicotine (NICOTINE STEP 1) 21 mg/24hr patch, Place 1 patch (21 mg total) onto the skin daily., Disp: 28 patch, Rfl: 0   propranolol (INDERAL) 10 MG tablet, TAKE 1 TABLET (10 MG TOTAL) BY MOUTH 3 (THREE) TIMES DAILY AS NEEDED. FOR SEVERE ANXIETY, Disp: 90 tablet, Rfl: 2   Vitamin D, Ergocalciferol, (DRISDOL) 1.25 MG (50000 UNIT) CAPS capsule, Take 50,000 Units by mouth every 7 (seven) days., Disp: , Rfl:       Objective:    BP 118/78   Pulse 68   Temp 98.4 F (36.9 C) (Temporal)   Ht '5\' 4"'$  (1.626 m)   Wt 224 lb 3.2 oz (101.7 kg)   SpO2 98%   BMI 38.48 kg/m   Wt Readings from Last 3 Encounters:  08/30/22 224 lb 3.2 oz (101.7 kg)  06/30/22 232 lb 6.4 oz (105.4 kg)  06/08/22 247 lb 2 oz (112.1 kg)    Physical Exam Constitutional:      General: She is not in acute distress.    Appearance: Normal appearance. She is normal weight. She is not ill-appearing, toxic-appearing or diaphoretic.  HENT:     Head: Normocephalic.  Cardiovascular:     Rate and Rhythm: Normal rate and regular rhythm.  Pulmonary:     Effort: Pulmonary effort is normal.  Musculoskeletal:     Right wrist: Decreased range of motion (pain with extension slight with finger numbness intermittently).  Neurological:     General: No focal deficit present.     Mental Status: She is alert and oriented to person, place, and time. Mental status is at baseline.  Psychiatric:        Mood and Affect: Mood normal.        Behavior: Behavior normal.        Thought Content: Thought content normal.        Judgment: Judgment normal.            Assessment & Plan:  Tobacco abuse Assessment & Plan: Smoking cessation instruction/counseling given:  counseled patient on the dangers of tobacco use, advised patient to stop smoking, and reviewed  strategies to maximize success    Orders: -     Nicotine; Place 1 patch (21 mg total) onto the skin daily.  Dispense: 28 patch; Refill: 0  Class 2 severe obesity due to excess calories with serious comorbidity and body mass index (BMI) of 38.0 to 38.9 in adult [facility]) Assessment & Plan: Pt advised to work on diet and exercise as tolerated    Right wrist pain Assessment & Plan: Continue with wearing wrist brace. Suspected carpal tunnel  F/u with neuro next week for eval   Mood disorder ([place])- emotional eating Assessment & Plan: Continue medications as prescribed as well as f/u with psychiatry as scheduled.    Mixed hyperlipidemia Assessment & Plan: Stable. Continue atorvastatin work on low chol diet      Return in about 6 months (around 02/28/2023) for f/u cholesterol, f/u CPE.  Eugenia Pancoast, MSN, APRN, FNP-C [place]

## 2022-08-30 NOTE — Assessment & Plan Note (Signed)
Smoking cessation instruction/counseling given:  counseled patient on the dangers of tobacco use, advised patient to stop smoking, and reviewed strategies to maximize success 

## 2022-08-30 NOTE — Assessment & Plan Note (Signed)
Continue medications as prescribed as well as f/u with psychiatry as scheduled.

## 2022-09-09 ENCOUNTER — Ambulatory Visit (INDEPENDENT_AMBULATORY_CARE_PROVIDER_SITE_OTHER): Payer: Commercial Managed Care - HMO | Admitting: Family Medicine

## 2022-09-14 ENCOUNTER — Other Ambulatory Visit: Payer: Self-pay | Admitting: Psychiatry

## 2022-09-14 DIAGNOSIS — F411 Generalized anxiety disorder: Secondary | ICD-10-CM

## 2022-09-14 DIAGNOSIS — F4010 Social phobia, unspecified: Secondary | ICD-10-CM

## 2022-09-16 ENCOUNTER — Telehealth: Payer: Self-pay | Admitting: Psychiatry

## 2022-09-16 NOTE — Telephone Encounter (Signed)
I do not see an appointment scheduled for this patient.  Will have staff contact.

## 2022-10-08 ENCOUNTER — Ambulatory Visit: Payer: Medicaid Other | Admitting: Nurse Practitioner

## 2022-10-08 ENCOUNTER — Ambulatory Visit (INDEPENDENT_AMBULATORY_CARE_PROVIDER_SITE_OTHER)
Admission: RE | Admit: 2022-10-08 | Discharge: 2022-10-08 | Disposition: A | Discharge status: Home / Self Care | Payer: Medicaid Other | Source: Ambulatory Visit | Attending: Nurse Practitioner | Admitting: Nurse Practitioner

## 2022-10-08 ENCOUNTER — Telehealth: Payer: Self-pay | Admitting: Family

## 2022-10-08 ENCOUNTER — Encounter: Payer: Self-pay | Admitting: Nurse Practitioner

## 2022-10-08 VITALS — BP 110/78 | HR 60 | Temp 98.3°F | Resp 16 | Ht 64.0 in | Wt 223.2 lb

## 2022-10-08 DIAGNOSIS — Z720 Tobacco use: Secondary | ICD-10-CM | POA: Diagnosis not present

## 2022-10-08 DIAGNOSIS — R071 Chest pain on breathing: Secondary | ICD-10-CM | POA: Diagnosis not present

## 2022-10-08 DIAGNOSIS — E782 Mixed hyperlipidemia: Secondary | ICD-10-CM

## 2022-10-08 LAB — COMPREHENSIVE METABOLIC PANEL
ALT: 19 U/L (ref 0–35)
AST: 15 U/L (ref 0–37)
Albumin: 3.9 g/dL (ref 3.5–5.2)
Alkaline Phosphatase: 118 U/L — ABNORMAL HIGH (ref 39–117)
BUN: 18 mg/dL (ref 6–23)
CO2: 28 mEq/L (ref 19–32)
Calcium: 9.6 mg/dL (ref 8.4–10.5)
Chloride: 101 mEq/L (ref 96–112)
Creatinine, Ser: 0.59 mg/dL (ref 0.40–1.20)
GFR: 98.24 mL/min (ref >60.00)
Glucose, Bld: 81 mg/dL (ref 70–99)
Potassium: 4.2 mEq/L (ref 3.5–5.1)
Sodium: 137 mEq/L (ref 135–145)
Total Bilirubin: 0.6 mg/dL (ref 0.2–1.2)
Total Protein: 7.4 g/dL (ref 6.0–8.3)

## 2022-10-08 LAB — CBC
HCT: 41.1 % (ref 36.0–46.0)
Hemoglobin: 13.4 g/dL (ref 12.0–15.0)
MCHC: 32.7 g/dL (ref 30.0–36.0)
MCV: 84.2 fl (ref 78.0–100.0)
Platelets: 287 10*3/uL (ref 150.0–400.0)
RBC: 4.88 Mil/uL (ref 3.87–5.11)
RDW: 14.6 % (ref 11.5–15.5)
WBC: 5.4 10*3/uL (ref 4.0–10.5)

## 2022-10-08 LAB — SEDIMENTATION RATE: Sed Rate: 50 mm/hr — ABNORMAL HIGH (ref 0–30)

## 2022-10-08 LAB — HIGH SENSITIVITY CRP: CRP, High Sensitivity: 7.25 mg/L — ABNORMAL HIGH (ref 0.000–5.000)

## 2022-10-08 NOTE — Telephone Encounter (Signed)
Patient evaluated in office 

## 2022-10-08 NOTE — Telephone Encounter (Signed)
Pt called requesting an appt for back & chest pain she's experiencing when breathing. Scheduled pt for 12pm today, 3/15 with [facility]. Transferred pt to access nurse. Call back # [phone]

## 2022-10-08 NOTE — Patient Instructions (Signed)
Nice to see you today I will be in touch with the labs once I have them Follow up if you do not improve If you have a different type of chest pain, get really short of breath, cold sweats or throwing up go to the nearest hosptial

## 2022-10-08 NOTE — Telephone Encounter (Signed)
Pt has already been arrived for her appt with Romilda Garret NP 10/08/22 at 12 noon. Sending note to Romilda Garret NP and [place] [facility].I will let Claiborne Billings CMA know as well.

## 2022-10-08 NOTE — Telephone Encounter (Signed)
[facility] - Client TELEPHONE ADVICE RECORD [facility]urse Patient Name: Kristina Savage Gender: Female DOB: [birth year] Age: 62 Y 4 D Return Phone Number: [phone] (Primary) [place]  [address] Client [facility] - Client Client Site [place] - Day Provider AA - PHYSICIAN, NOT LISTED- MD Contact Type Call Who Is Calling Patient / Member / Family / Caregiver Call Type Triage / Clinical Relationship To Patient Self Return Phone Number [phone] (Primary) Chief Complaint CHEST PAIN - pain, pressure, heaviness or tightness Reason for Call Symptomatic / Request for Health Information Initial Comment Office transferred caller. Caller states she has pain in her chest and back when she takes a deep breath. She see's NP Eugenia Pancoast . Not listed. Location verified. Translation No Nurse Assessment Nurse: D'Heur Lucia Gaskins, RN, Adrienne Date/Time (Eastern Time): 10/08/2022 11:37:07 AM Confirm and document reason for call. If symptomatic, describe symptoms. ---Caller states she has pain in her chest and back when she takes a deep breath. It started two weeks ago. No other symptoms. No fever. The pain is intermittent and it feels "heavy". If she is lying flat, it feels like a weight on her chest. It is usually on the right side and sometimes it is across the whole chest. Does the patient have any new or worsening symptoms? ---Yes Will a triage be completed? ---Yes Related visit to physician within the last 2 weeks? ---No Does the PT have any chronic conditions? (i.e. diabetes, asthma, this includes High risk factors for pregnancy, etc.) ---Yes List chronic conditions. ---Parkinsons, Hx of stroke, depression Is this a behavioral health or substance abuse call? ---No Guidelines Guideline Title Affirmed Question Affirmed Notes Nurse Date/Time Eilene Ghazi Time) Chest Pain  Taking a deep breath makes pain worse D'Heur Lucia Gaskins, RN, Vincente Liberty 10/08/2022 11:40:46 AM PLEASE NOTE: All timestamps contained within this report are represented as [place] Standard Time. CONFIDENTIALTY NOTICE: This fax transmission is intended only for the addressee. It contains information that is legally privileged, confidential or otherwise protected from use or disclosure. If you are not the intended recipient, you are strictly prohibited from reviewing, disclosing, copying using or disseminating any of this information or taking any action in reliance on or regarding this information. If you have received this fax in error, please notify us immediately by telephone so that we can arrange for its return to [place]. Phone: [phone], Toll-Free: [phone], Fax: [phone] Page: 2 of 2 Call Id: [id number] [place]. Time Eilene Ghazi Time) Disposition Final User 10/08/2022 11:33:45 AM Send to Urgent Queue Jaynie Crumble 10/08/2022 11:44:52 AM Go to ED Now (or PCP triage) Yes D'Heur Lucia Gaskins, RN, Adrienne Final Disposition 10/08/2022 11:44:52 AM Go to ED Now (or PCP triage) Yes D'Heur Lucia Gaskins, RN, Ebbie Latus Disagree/Comply Comply Caller Understands Yes PreDisposition Call Doctor Care Advice Given Per Guideline GO TO ED NOW (OR PCP TRIAGE): CALL EMS IF: * Severe difficulty breathing occurs * Passes out or becomes too weak to stand * You become worse CARE ADVICE given per Chest Pain (Adult) guideline. Comments User: Vincente Liberty, D'Heur Lucia Gaskins, RN Date/Time Eilene Ghazi Time): 10/08/2022 11:43:06 AM Caller has sleep apnea. User: Vincente Liberty, D'Heur Lucia Gaskins, RN Date/Time Eilene Ghazi Time): 10/08/2022 11:45:32 AM Caller has appointment at the office in 15 minutes. Referrals REFERRED TO PCP OFFIC

## 2022-10-08 NOTE — Progress Notes (Signed)
Acute Office Visit  Subjective:     Patient ID: Kristina Savage, female    DOB: 12/28/1960, 62 y.o.   MRN: [id number]  Chief Complaint  Patient presents with   Chest Pain    When she breaths in X 2 weeks. Sometimes when she get out of bed it hurts, and hurts so bad that she can feel in her back.     Patient is in today for chest pain with a history of tobacco abuse, hyperlipidemia, pre-diabetes, stroke   Symptoms started 2 weeks ago States that she woke up that way. States that's when she gets up it hurts her. States it is a pressure and can feel it in her back. States worse with laying down to getting up and with deep breaths. Activity like being up and walking around does not effect it States heaviness. History of hear attack: no History of PE/DVT: no. No recent travel, surgery or exogenous hormone use  States that family has been dealing with illness to some nausea she experienced that approximately 1 week ago.  Has since resolved no recent illness prior to other symptoms starting.  Review of Systems  Constitutional:  Negative for chills and fever.  Respiratory:  Positive for shortness of breath.   Cardiovascular:  Positive for chest pain. Negative for palpitations.  Gastrointestinal:  Positive for nausea (improved). Negative for abdominal pain, diarrhea and vomiting.  Neurological:  Positive for tremors.        Objective:    BP 110/78   Pulse 60   Temp 98.3 F (36.8 C)   Resp 16   Ht 5\' 4"  (1.626 m)   Wt 223 lb 4 oz (101.3 kg)   SpO2 99%   BMI 38.32 kg/m    Physical Exam Vitals and nursing note reviewed.  Constitutional:      Appearance: Normal appearance.  Cardiovascular:     Rate and Rhythm: Normal rate and regular rhythm.     Heart sounds: Normal heart sounds.  Pulmonary:     Effort: Pulmonary effort is normal.     Breath sounds: Normal breath sounds.  Chest:     Chest wall: Tenderness present.  Abdominal:     General: Bowel sounds are normal.  There is no distension.     Palpations: There is no mass.     Tenderness: There is no abdominal tenderness.     Hernia: No hernia is present.  Musculoskeletal:     Right lower leg: No edema.     Left lower leg: No edema.  Neurological:     Mental Status: She is alert.     No results found for any visits on 10/08/22.      Assessment & Plan:   Problem List Items Addressed This Visit       Other   Mixed hyperlipidemia   Tobacco abuse   Chest pain on breathing - Primary    EKG within normal limits.  Low likelihood of cardiac etiology will do chest x-ray along with basic labs inclusive ESR, CRP, D-dimer.  Patient was given signs and symptoms when to seek emergent healthcare over the weekend pending labs before treatment decision made.      Relevant Orders   EKG 12-Lead (Completed)   CBC   Comprehensive metabolic panel   D-dimer, quantitative   DG Chest 2 View   High sensitivity CRP   Sedimentation rate    No orders of the defined types were placed in this encounter.  Return if symptoms worsen or fail to improve.  Romilda Garret, NP

## 2022-10-08 NOTE — Telephone Encounter (Signed)
Noted  

## 2022-10-08 NOTE — Assessment & Plan Note (Signed)
EKG within normal limits.  Low likelihood of cardiac etiology will do chest x-ray along with basic labs inclusive ESR, CRP, D-dimer.  Patient was given signs and symptoms when to seek emergent healthcare over the weekend pending labs before treatment decision made.

## 2022-10-11 LAB — D-DIMER, QUANTITATIVE

## 2022-10-11 LAB — EXTRA SPECIMEN

## 2022-10-12 ENCOUNTER — Other Ambulatory Visit: Payer: Medicaid Other

## 2022-10-12 DIAGNOSIS — R071 Chest pain on breathing: Secondary | ICD-10-CM

## 2022-10-13 ENCOUNTER — Telehealth: Payer: Self-pay

## 2022-10-13 ENCOUNTER — Other Ambulatory Visit: Payer: Self-pay | Admitting: Psychiatry

## 2022-10-13 ENCOUNTER — Ambulatory Visit
Admission: RE | Admit: 2022-10-13 | Discharge: 2022-10-13 | Disposition: A | Discharge status: Home / Self Care | Payer: Medicaid Other | Source: Ambulatory Visit | Attending: Nurse Practitioner | Admitting: Nurse Practitioner

## 2022-10-13 ENCOUNTER — Other Ambulatory Visit: Payer: Self-pay | Admitting: Nurse Practitioner

## 2022-10-13 ENCOUNTER — Other Ambulatory Visit (INDEPENDENT_AMBULATORY_CARE_PROVIDER_SITE_OTHER): Payer: Self-pay | Admitting: Family Medicine

## 2022-10-13 DIAGNOSIS — R071 Chest pain on breathing: Secondary | ICD-10-CM

## 2022-10-13 DIAGNOSIS — E559 Vitamin D deficiency, unspecified: Secondary | ICD-10-CM

## 2022-10-13 DIAGNOSIS — R7989 Other specified abnormal findings of blood chemistry: Secondary | ICD-10-CM | POA: Insufficient documentation

## 2022-10-13 DIAGNOSIS — F411 Generalized anxiety disorder: Secondary | ICD-10-CM

## 2022-10-13 DIAGNOSIS — F331 Major depressive disorder, recurrent, moderate: Secondary | ICD-10-CM

## 2022-10-13 DIAGNOSIS — F4010 Social phobia, unspecified: Secondary | ICD-10-CM

## 2022-10-13 LAB — D-DIMER, QUANTITATIVE: D-Dimer, Quant: 1 mcg/mL FEU — ABNORMAL HIGH (ref <0.50)

## 2022-10-13 MED ORDER — MELOXICAM 15 MG PO TABS: 15.0000 mg | ORAL_TABLET | Freq: Every day | ORAL | 0 refills | Status: DC

## 2022-10-13 MED ORDER — IOHEXOL 350 MG/ML SOLN
75.0000 mL | Freq: Once | INTRAVENOUS | Status: AC | PRN
  Administered 2022-10-13: 75 mL via INTRAVENOUS

## 2022-10-13 NOTE — Telephone Encounter (Signed)
Pt returned call regarding lab results requesting a call back [phone]

## 2022-10-13 NOTE — Telephone Encounter (Signed)
noted 

## 2022-10-13 NOTE — Telephone Encounter (Signed)
Responded via my chart and MA called and spoke with the patient

## 2022-10-13 NOTE — Telephone Encounter (Signed)
[place] Night - Client TELEPHONE ADVICE RECORD [facility] Patient Name: Kristina Savage DOY LE Gender: Female DOB: 1960-12-31 Age: 62 Y 24 D Return Phone Number: Address: City/ State/ Zip: [place]  [address] Client  [facility] Night - Client Client Site [place] Provider Romilda Garret- NP Contact Type Call Who Is Calling Lab / Radiology Lab Name [facility] Phone Number [phone] Lab Tech Name Jule Economy Lab Reference Number [id number] Y Chief Complaint Lab Result (Critical or Stat) Call Type Lab Send to RN Reason for Call Report lab results Initial Comment Caller states she is calling to report a critical lab result. Translation No Nurse Assessment Nurse: Vallery Sa, RN, [place] Date/Time (Eastern Time): 10/13/2022 7:58:09 AM Is there an on-call provider listed? ---Yes Please list name of person reporting value (Lab Employee) and a contact number. ---[id number], [id number] Y, [facility], [place] Please document the following items: Lab name Lab value (read back to lab to verify) Reference range for lab value Date and time blood was drawn Collect time of birth for bilirubin results ---D-Dimer high 1.0 (normal: less than 0.50) Drawn 10/12/2022 @ 3:15pm Please collect the patient contact information from the lab. (name, phone number and address) ---[phone], [address] [place] List any special notes provided by lab. ---No special notes Disp. Time Eilene Ghazi Time) Disposition Final User 10/13/2022 8:12:01 AM Called On-Call Provider [place], RN, [facility] 10/13/2022 8:12:58 AM Clinical Call Yes Vallery Sa, RN, [facility] 10/13/2022 8:13:12 AM Lab Call Vallery Sa, RN, [place] Reason: Lab 10/13/2022 8:13:19 AM Call Completed Vallery Sa, RN, [place] Final Disposition 10/13/2022 8:12:58 AM Clinical Call Yes Vallery Sa, RN, [place] PLEASE NOTE: All timestamps contained within this report are  represented as [place] Standard Time. CONFIDENTIALTY NOTICE: This fax transmission is intended only for the addressee. It contains information that is legally privileged, confidential or otherwise protected from use or disclosure. If you are not the intended recipient, you are strictly prohibited from reviewing, disclosing, copying using or disseminating any of this information or taking any action in reliance on or regarding this information. If you have received this fax in error, please notify us immediately by telephone so that we can arrange for its return to [place]. Phone: [phone], Toll-Free: [phone], Fax: [phone] Page: 2 of 2 Call Id: [id number] Paging DoctorName Phone DateTime Result/ Outcome Message Type Notes Reena for MD [id number] 10/13/2022 8:12:01 AM Called On Call Provider - Reached Doctor Paged Reena for MD 10/13/2022 8:12:42 AM Spoke with On Call - Outcome Notification Message Result MD went off call at [place] of the abnormal lab via office backline. Lab stated they would also fax report to the office

## 2022-10-13 NOTE — Telephone Encounter (Signed)
Cathy RN with access nurse just took critical lab D Dimer 1.0; specimen was OK with no problem with specimen. Sending note to Romilda Garret NP, [place] [facility] and will teams [employer]. Logged in lab notebook also. Report is in [place].

## 2022-10-14 ENCOUNTER — Telehealth: Payer: Self-pay | Admitting: Psychiatry

## 2022-10-14 NOTE — Progress Notes (Signed)
Noted and thank you for caring for this patient.

## 2022-10-14 NOTE — Telephone Encounter (Signed)
Received a prior authorization request for duloxetine.  Will have staff assist with the same.

## 2022-10-19 ENCOUNTER — Other Ambulatory Visit: Payer: Self-pay

## 2022-10-19 DIAGNOSIS — I679 Cerebrovascular disease, unspecified: Secondary | ICD-10-CM

## 2022-10-19 DIAGNOSIS — E782 Mixed hyperlipidemia: Secondary | ICD-10-CM

## 2022-10-19 MED ORDER — ATORVASTATIN CALCIUM 10 MG PO TABS: 10.0000 mg | ORAL_TABLET | Freq: Every day | ORAL | 3 refills | Status: DC

## 2022-10-25 ENCOUNTER — Other Ambulatory Visit: Payer: Self-pay | Admitting: Psychiatry

## 2022-10-25 DIAGNOSIS — F4010 Social phobia, unspecified: Secondary | ICD-10-CM

## 2022-10-25 DIAGNOSIS — F411 Generalized anxiety disorder: Secondary | ICD-10-CM

## 2022-10-26 ENCOUNTER — Telehealth: Payer: Self-pay | Admitting: Adult Health

## 2022-10-29 ENCOUNTER — Telehealth: Payer: Self-pay

## 2022-10-29 NOTE — Telephone Encounter (Signed)
pt was notified of the medication and cost.

## 2022-10-29 NOTE — Telephone Encounter (Signed)
spoke with the pharmacy and the cymbalta duloxetine 30mg  was ready and is $4.00.

## 2022-10-29 NOTE — Telephone Encounter (Signed)
Patient already has  script ready for duloxetine.

## 2022-11-03 ENCOUNTER — Ambulatory Visit: Payer: Managed Care, Other (non HMO) | Admitting: Adult Health

## 2022-11-12 ENCOUNTER — Ambulatory Visit: Payer: Medicaid Other | Admitting: Primary Care

## 2022-11-12 ENCOUNTER — Encounter: Payer: Self-pay | Admitting: Primary Care

## 2022-11-12 VITALS — BP 118/64 | HR 80 | Temp 97.5°F | Ht 64.0 in | Wt 222.0 lb

## 2022-11-12 DIAGNOSIS — L02419 Cutaneous abscess of limb, unspecified: Secondary | ICD-10-CM | POA: Insufficient documentation

## 2022-11-12 DIAGNOSIS — L03119 Cellulitis of unspecified part of limb: Secondary | ICD-10-CM

## 2022-11-12 MED ORDER — SULFAMETHOXAZOLE-TRIMETHOPRIM 800-160 MG PO TABS: 1.0000 | ORAL_TABLET | Freq: Two times a day (BID) | ORAL | 0 refills | Status: DC

## 2022-11-12 NOTE — Progress Notes (Signed)
Subjective:    Patient ID: KIAN GAMARRA, female    DOB: 06-Jun-1961, 62 y.o.   MRN: [id number]  HPI  LURETTA EVERLY is a very pleasant 62 y.o. female patient of Tabitha, NP with a history of Parkinson's disease, cerebral vascular disease, prediabetes, tobacco use who presents today to discuss insect bite.  Symptom onset two to three days ago with erythema, an open wound, mild swelling to the right anterior forearm. The erythema began to extend outward with warmth. The site became tender.  She also noticed the same type of wound to her right groin about 2-3 days ago. She believes the wound to the right groin has popped as the pain has improved.   She's applied neosporin and Bactine spray to the site on her right forearm with improvement in the erythema. She denies fevers, chills[place].    Review of Systems  Constitutional:  Negative for fever.  Skin:  Positive for color change and wound.         Past Medical History:  Diagnosis Date   Anxiety    B12 deficiency    Chicken pox    Depression    Diverticulitis    Dizziness    Genital warts    GERD (gastroesophageal reflux disease)    Hay fever    Hyperlipidemia    Ovarian cyst    Parkinson's disease    Dr.Shaw Frederic [facility] and at [facility].  Functional/neurological condition.   Poor balance    Pre-diabetes    Sleep apnea    Stroke    UTI (lower urinary tract infection)    Vitamin D deficiency     Social History   Socioeconomic History   Marital status: Married    Spouse name: Theodoro Grist   Number of children: 4   Years of education: High school   Highest education level: Not on file  Occupational History   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  Tobacco Use   Smoking status: Every Day    Packs/day: 0.25    Years: 30.00    Additional pack years: 0.00    Total pack years: 7.50    Types: Cigarettes    Passive exposure: Past   Smokeless tobacco: Never  Vaping Use    Vaping Use: Not on file  Substance and Sexual Activity   Alcohol use: Not Currently    Alcohol/week: 0.0 standard drinks of alcohol    Comment: once a year   Drug use: No   Sexual activity: Yes    Birth control/protection: Post-menopausal  Other Topics Concern   Not on file  Social History Narrative   04/24/20   From: [place] - moved to [place] to raise her family   Living: with husband, Theodoro Grist (1991)         Family: 7 children - including step children - twin boys nearby and daughter nearby - 10 grandchildren      Enjoys: spend time with family      Exercise: knee limiting exercise - tries to walk   Diet: eats whatever      Safety   Seat belts: Yes    Guns: Yes  and secure   Safe in relationships: Yes    Social Determinants of Health   Financial Resource Strain: Not on file  Food Insecurity: Not on file  Transportation Needs: Not on file  Physical Activity: Not on file  Stress: Not on file  Social Connections: Not on file  Intimate Partner Violence: Not on file    Past Surgical History:  Procedure Laterality Date   CERVIX REMOVAL     Laser surgery, not complete removal   CESAREAN SECTION     CHONDROPLASTY Right 06/11/2020   Procedure: CHONDROPLASTY;  Surgeon: Beverely Low, MD;  Location: [facility];  Service: Orthopedics;  Laterality: Right;   KNEE ARTHROSCOPY WITH MEDIAL MENISECTOMY Right 06/11/2020   Procedure: KNEE ARTHROSCOPY WITH MEDIAL MENISECTOMY;  Surgeon: Beverely Low, MD;  Location: [facility];  Service: Orthopedics;  Laterality: Right;    Family History  Problem Relation Age of Onset   Arthritis Mother    Hypertension Mother    COPD Mother    Heart failure Mother    Heart disease Mother    Depression Mother    Eating disorder Mother    Obesity Mother    Arthritis Father    Thyroid disease Father        thinks cancer   Parkinson's disease Father    Alcohol abuse Sister    Breast cancer Maternal Grandmother     Diabetes Paternal Grandmother    Parkinson's disease Paternal Grandmother    Diabetes Son    Cancer Maternal Aunt        Female cancer - not sure what   Breast cancer Maternal Aunt    Prostate cancer Maternal Uncle    Colon cancer Maternal Uncle 45    Allergies  Allergen Reactions   Oxycodone-Acetaminophen Nausea And Vomiting   Percocet [Oxycodone-Acetaminophen] Nausea And Vomiting   Pollen Extract     Nasal congestion   Prozac [Fluoxetine] Nausea Only    Current Outpatient Medications on File Prior to Visit  Medication Sig Dispense Refill   aspirin EC 81 MG tablet Take 81 mg by mouth daily. Swallow whole.     atorvastatin (LIPITOR) 10 MG tablet Take 1 tablet (10 mg total) by mouth daily. 90 tablet 3   clonazePAM (KLONOPIN) 0.5 MG tablet Take 0.5-1 tablets (0.25-0.5 mg total) by mouth as directed. Take half to 1 tablet 1-2 times a week for severe anxiety, agitation 7 tablet 1   cyanocobalamin (CYANOCOBALAMIN) 500 MCG tablet 200-581mcg QD     DULoxetine (CYMBALTA) 30 MG capsule TAKE 1 CAPSULE BY MOUTH TWICE A DAY 60 capsule 2   meloxicam (MOBIC) 15 MG tablet Take 1 tablet (15 mg total) by mouth daily. Take with food. Avoid other NSAIDs like: ibuprofen, motrin, aleve, naproxen, BC/Goody powders (Patient not taking: Reported on 11/12/2022) 15 tablet 0   nicotine (NICOTINE STEP 1) 21 mg/24hr patch Place 1 patch (21 mg total) onto the skin daily. 28 patch 0   propranolol (INDERAL) 10 MG tablet TAKE 1 TABLET (10 MG TOTAL) BY MOUTH 3 (THREE) TIMES DAILY AS NEEDED. FOR SEVERE ANXIETY 90 tablet 0   carbidopa-levodopa (SINEMET IR) 25-100 MG tablet Take 2 tablets by mouth 3 (three) times daily.     Vitamin D, Ergocalciferol, (DRISDOL) 1.25 MG (50000 UNIT) CAPS capsule Take 50,000 Units by mouth every 7 (seven) days. (Patient not taking: Reported on 11/12/2022)     [DISCONTINUED] PROVENTIL HFA 108 (90 BASE) MCG/ACT inhaler Inhale 2 puffs into the lungs daily as needed for shortness of breath.   0    No current facility-administered medications on file prior to visit.    BP 118/64   Pulse 80   Temp (!) 97.5 F (36.4 C) (Temporal)   Ht  (1.626 m)  Wt 222 lb (100.7 kg)   SpO2 99%   BMI 38.11 kg/m  Objective:   Physical Exam Genitourinary:      Comments: 1 cm light pink open wound in between right labia majora and groin. No drainage.   Skin:    General: Skin is warm and dry.     Findings: Erythema present.     Comments: 1 cm wound to right anterior forearm with an intact puss like filled head.  Tender. Warmth. Surrounding erythema.    Neurological:     Mental Status: She is alert.           Assessment & Plan:  Cellulitis and abscess of upper extremity Assessment & Plan: Appears to be spider bite to the right forearm. Right groin appears mostly healed.  Start Bactrim DS (sulfamethoxazole/trimethoprim) tablets. Take 1 tablet by mouth twice daily for 7 days.  Discussed home care instructions. Return precautions provided.   Orders: -     Sulfamethoxazole-Trimethoprim; Take 1 tablet by mouth 2 (two) times daily.  Dispense: 14 tablet; Refill: 0        Doreene Nest, NP

## 2022-11-12 NOTE — Patient Instructions (Signed)
Start Bactrim DS (sulfamethoxazole/trimethoprim) tablets. Take 1 tablet by mouth twice daily for 7 days.  Keep the site covered as discussed.   Please return if no improvement within 2-3 days and/or if the wound does not completely heal.   It was a pleasure meeting you!

## 2022-11-12 NOTE — Assessment & Plan Note (Signed)
Appears to be spider bite to the right forearm. Right groin appears mostly healed.  Start Bactrim DS (sulfamethoxazole/trimethoprim) tablets. Take 1 tablet by mouth twice daily for 7 days.  Discussed home care instructions. Return precautions provided.

## 2022-11-22 ENCOUNTER — Telehealth: Payer: Self-pay

## 2022-11-22 NOTE — Telephone Encounter (Signed)
Stats that she had knot above right eye around temple. She noticed Saturday evening and it got the size of golf ball. Denies any injury. No changes in vision or issues with breathing. She was advised by access nurse to take benadryl. Shortly after it went away. She had something like this in the past and thinks that it is coming from C pap head gear. Reviewed proper cleaning for all C pap gear with patient. She declined appointment at this time. Will call if any questions.

## 2022-11-22 NOTE — Telephone Encounter (Signed)
With that kind of growth in a small period of time I do feel like she would require an assessment, more urgently especially if still increasing in size and itchy.

## 2022-11-22 NOTE — Telephone Encounter (Signed)
Called left message to call office

## 2022-11-22 NOTE — Telephone Encounter (Signed)
Patient states that area has gone away. Was never itchy. She declines to make appointment at this time.

## 2022-11-28 ENCOUNTER — Other Ambulatory Visit: Payer: Self-pay | Admitting: Psychiatry

## 2022-11-28 DIAGNOSIS — F4010 Social phobia, unspecified: Secondary | ICD-10-CM

## 2022-11-28 DIAGNOSIS — F411 Generalized anxiety disorder: Secondary | ICD-10-CM

## 2022-12-06 ENCOUNTER — Other Ambulatory Visit: Payer: Self-pay | Admitting: Psychiatry

## 2022-12-06 DIAGNOSIS — F331 Major depressive disorder, recurrent, moderate: Secondary | ICD-10-CM

## 2022-12-06 DIAGNOSIS — F4010 Social phobia, unspecified: Secondary | ICD-10-CM

## 2022-12-06 DIAGNOSIS — F411 Generalized anxiety disorder: Secondary | ICD-10-CM

## 2022-12-07 ENCOUNTER — Telehealth: Payer: Self-pay

## 2022-12-07 DIAGNOSIS — F411 Generalized anxiety disorder: Secondary | ICD-10-CM

## 2022-12-07 DIAGNOSIS — F331 Major depressive disorder, recurrent, moderate: Secondary | ICD-10-CM

## 2022-12-07 DIAGNOSIS — F4010 Social phobia, unspecified: Secondary | ICD-10-CM

## 2022-12-07 MED ORDER — DULOXETINE HCL 30 MG PO CPEP: 30.0000 mg | ORAL_CAPSULE | Freq: Two times a day (BID) | ORAL | 2 refills | Status: DC

## 2022-12-07 NOTE — Progress Notes (Unsigned)
PATIENT: Kristina Savage DOB: 09-Apr-1961  REASON FOR VISIT: follow up HISTORY FROM: patient  No chief complaint on file.    HISTORY OF PRESENT ILLNESS:  12/07/22 ALL:  Kristina Savage is a 62 y.o. female here today for follow up for OSA on CPAP.    She is followed by Dr Sherryll Burger for functional movement disorder/functional tremor.   HISTORY: (copied from Dr Teofilo Pod previous note)  Kristina Savage is a 62 year old left-handed woman with an underlying medical history of reflux disease, prediabetes, diverticulitis, dizziness, anxiety, depression, smoking, and severe obesity with a BMI of over 40, who presents for follow-up consultation of her obstructive sleep apnea after starting AutoPap therapy.  The patient is accompanied by her husband today.  I first met her at the request of her primary care physician on 08/04/2020, at which time we talked about her tremor of approximately 1 years duration.  She had no signs of parkinsonism.  We also talked about her sleepiness during the day as well as snoring.  She was advised to proceed with a brain MRI with and without contrast as well as a sleep study.  She had a brain MRI with and without contrast on 08/17/2020 and I reviewed the results:IMPRESSION: This MRI of the brain with and without contrast shows the following: 1.   No acute findings.  Normal enhancement pattern. 2.   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 3.   Some scattered T2/FLAIR hyperintense foci in the hemispheres and pons consistent with mild chronic microvascular ischemic change. She was notified by phone call.  She had a home sleep test on 08/27/2020, which indicated severe obstructive sleep apnea with an AHI of 36.1/h, O2 nadir 81%.  She was advised to start AutoPap therapy.  Her set up date was 09/09/2021.  She has a ResMed air sense 10 AutoSet machine.  Today, 11/02/2021: I reviewed her AutoPap compliance data from 09/09/2021 through  10/08/2021, which is a total of 30 days, during which time she used her machine every night with percent use days greater than 4 hours at 100%, indicating superb compliance with an average usage of 7 hours and 21 minutes, residual AHI at goal but on the higher side, at 4.6/h, average pressure for the 95th percentile at 11.6 cm with a range of 6 to 12 cm with EPR, leak on the higher side with the 95th percentile at 28.1 L/min.  Leak has increased in the past few weeks.  She reports that she has loosened the headgear a little bit, she can tighten it again.  She has been using an under the nose style fullface mask with good success overall.  She has changed the filter already once.  She is mindful of the cleanliness with the machine and uses distilled water in the humidifier, overall, she feels much improved in terms of her sleep quality and sleep consolidation.  Her husband endorses that she sleeps quieter, no longer snores when she is on the machine but starts snoring if she takes the mask off.  She has been working on weight loss but would like to talk to her primary care about a referral to a weight management clinic.  She does have some mouth dryness.  She has better daytime energy and less daytime sleepiness.  She is very motivated to continue with treatment with her AutoPap machine.  Sometimes when she wakes up in the middle of the night and the pressure feels too  high she turns the machine off and on again.  She has seen neurology through [facility] clinic.  She had evaluation through a movement disorders clinic as well for concern for parkinsonism.  She had a DaTscan last year which was reported as normal.  She recently presented to the emergency room with intermittent difficulty getting her words out.  She had an MR angiogram of the head and neck as well as an MRI of the brain and I reviewed the results: IMPRESSION: 1. No acute intracranial pathology. 2. Unchanged remote infarct in the left cerebellar  hemisphere. Scattered small foci of FLAIR signal abnormality in the subcortical and periventricular white matter are nonspecific but most likely reflects sequela of mild chronic white matter microangiopathy. 3. Patent vasculature of the head and neck with no significant stenosis or occlusion. She has an appointment pending with Dr. Sherryll Burger.   REVIEW OF SYSTEMS: Out of a complete 14 system review of symptoms, the patient complains only of the following symptoms, and all other reviewed systems are negative.  ESS:  ALLERGIES: Allergies  Allergen Reactions   Oxycodone-Acetaminophen Nausea And Vomiting   Percocet [Oxycodone-Acetaminophen] Nausea And Vomiting   Pollen Extract     Nasal congestion   Prozac [Fluoxetine] Nausea Only    HOME MEDICATIONS: Outpatient Medications Prior to Visit  Medication Sig Dispense Refill   meloxicam (MOBIC) 15 MG tablet Take 1 tablet (15 mg total) by mouth daily. Take with food. Avoid other NSAIDs like: ibuprofen, motrin, aleve, naproxen, BC/Goody powders (Patient not taking: Reported on 11/12/2022) 15 tablet 0   aspirin EC 81 MG tablet Take 81 mg by mouth daily. Swallow whole.     atorvastatin (LIPITOR) 10 MG tablet Take 1 tablet (10 mg total) by mouth daily. 90 tablet 3   carbidopa-levodopa (SINEMET IR) 25-100 MG tablet Take 2 tablets by mouth 3 (three) times daily.     clonazePAM (KLONOPIN) 0.5 MG tablet Take 0.5-1 tablets (0.25-0.5 mg total) by mouth as directed. Take half to 1 tablet 1-2 times a week for severe anxiety, agitation 7 tablet 1   cyanocobalamin (CYANOCOBALAMIN) 500 MCG tablet 200-589mcg QD     DULoxetine (CYMBALTA) 30 MG capsule Take 1 capsule (30 mg total) by mouth 2 (two) times daily. 60 capsule 2   nicotine (NICOTINE STEP 1) 21 mg/24hr patch Place 1 patch (21 mg total) onto the skin daily. 28 patch 0   propranolol (INDERAL) 10 MG tablet TAKE 1 TABLET (10 MG TOTAL) BY MOUTH 3 (THREE) TIMES DAILY AS NEEDED. FOR SEVERE ANXIETY 90 tablet 0    sulfamethoxazole-trimethoprim (BACTRIM DS) 800-160 MG tablet Take 1 tablet by mouth 2 (two) times daily. 14 tablet 0   Vitamin D, Ergocalciferol, (DRISDOL) 1.25 MG (50000 UNIT) CAPS capsule Take 50,000 Units by mouth every 7 (seven) days. (Patient not taking: Reported on 11/12/2022)     No facility-administered medications prior to visit.    PAST MEDICAL HISTORY: Past Medical History:  Diagnosis Date   Anxiety    B12 deficiency    Chicken pox    Depression    Diverticulitis    Dizziness    Genital warts    GERD (gastroesophageal reflux disease)    Hay fever    Hyperlipidemia    Ovarian cyst    Parkinson's disease    Dr.Shaw Lajas [place] and at [facility].  Functional/neurological condition.   Poor balance    Pre-diabetes    Sleep apnea    Stroke [facility])    UTI (lower urinary tract  infection)    Vitamin D deficiency     PAST SURGICAL HISTORY: Past Surgical History:  Procedure Laterality Date   CERVIX REMOVAL     Laser surgery, not complete removal   CESAREAN SECTION     CHONDROPLASTY Right 06/11/2020   Procedure: CHONDROPLASTY;  Surgeon: Beverely Low, MD;  Location: [facility];  Service: Orthopedics;  Laterality: Right;   KNEE ARTHROSCOPY WITH MEDIAL MENISECTOMY Right 06/11/2020   Procedure: KNEE ARTHROSCOPY WITH MEDIAL MENISECTOMY;  Surgeon: Beverely Low, MD;  Location: [facility];  Service: Orthopedics;  Laterality: Right;    FAMILY HISTORY: Family History  Problem Relation Age of Onset   Arthritis Mother    Hypertension Mother    COPD Mother    Heart failure Mother    Heart disease Mother    Depression Mother    Eating disorder Mother    Obesity Mother    Arthritis Father    Thyroid disease Father        thinks cancer   Parkinson's disease Father    Alcohol abuse Sister    Breast cancer Maternal Grandmother    Diabetes Paternal Grandmother    Parkinson's disease Paternal Grandmother    Diabetes Son    Cancer Maternal Aunt         Female cancer - not sure what   Breast cancer Maternal Aunt    Prostate cancer Maternal Uncle    Colon cancer Maternal Uncle 45    SOCIAL HISTORY: Social History   Socioeconomic History   Marital status: Married    Spouse name: Theodoro Grist   Number of children: 4   Years of education: High school   Highest education level: Not on file  Occupational History   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  Tobacco Use   Smoking status: Every Day    Packs/day: 0.25    Years: 30.00    Additional pack years: 0.00    Total pack years: 7.50    Types: Cigarettes    Passive exposure: Past   Smokeless tobacco: Never  Vaping Use   Vaping Use: Not on file  Substance and Sexual Activity   Alcohol use: Not Currently    Alcohol/week: 0.0 standard drinks of alcohol    Comment: once a year   Drug use: No   Sexual activity: Yes    Birth control/protection: Post-menopausal  Other Topics Concern   Not on file  Social History Narrative   04/24/20   From: [place] - moved to [place] to raise her family   Living: with husband, Theodoro Grist (1991)         Family: 7 children - including step children - twin boys nearby and daughter nearby - 10 grandchildren      Enjoys: spend time with family      Exercise: knee limiting exercise - tries to walk   Diet: eats whatever      Safety   Seat belts: Yes    Guns: Yes  and secure   Safe in relationships: Yes    Social Determinants of Health   Financial Resource Strain: Not on file  Food Insecurity: Not on file  Transportation Needs: Not on file  Physical Activity: Not on file  Stress: Not on file  Social Connections: Not on file  Intimate Partner Violence: Not on file     PHYSICAL EXAM  There were no vitals filed for this visit. There is no height or weight on file to calculate BMI.  Generalized: Well developed, in no acute distress  Cardiology: normal rate and rhythm, no murmur noted Respiratory: clear to auscultation bilaterally  Neurological  examination  Mentation: Alert oriented to time, place, history taking. Follows all commands speech and language fluent Cranial nerve II-XII: Pupils were equal round reactive to light. Extraocular movements were full, visual field were full  Motor: The motor testing reveals 5 over 5 strength of all 4 extremities. Good symmetric motor tone is noted throughout.  Gait and station: Gait is normal.    DIAGNOSTIC DATA (LABS, IMAGING, TESTING) - I reviewed patient records, labs, notes, testing and imaging myself where available.      No data to display           Lab Results  Component Value Date   WBC 5.4 10/08/2022   HGB 13.4 10/08/2022   HCT 41.1 10/08/2022   MCV 84.2 10/08/2022   PLT 287.0 10/08/2022      Component Value Date/Time   NA 137 10/08/2022 1240   NA 144 02/02/2022 1105   K 4.2 10/08/2022 1240   CL 101 10/08/2022 1240   CO2 28 10/08/2022 1240   GLUCOSE 81 10/08/2022 1240   BUN 18 10/08/2022 1240   BUN 16 02/02/2022 1105   CREATININE 0.59 10/08/2022 1240   CALCIUM 9.6 10/08/2022 1240   PROT 7.4 10/08/2022 1240   PROT 7.0 02/02/2022 1105   ALBUMIN 3.9 10/08/2022 1240   ALBUMIN 4.6 02/02/2022 1105   AST 15 10/08/2022 1240   ALT 19 10/08/2022 1240   ALKPHOS 118 (H) 10/08/2022 1240   BILITOT 0.6 10/08/2022 1240   BILITOT 0.5 02/02/2022 1105   GFRNONAA >60 03/13/2022 0845   GFRAA >90 07/18/2014 0738   Lab Results  Component Value Date   CHOL 141 02/02/2022   HDL 58 02/02/2022   LDLCALC 69 02/02/2022   TRIG 67 02/02/2022   CHOLHDL 2 03/10/2021   Lab Results  Component Value Date   HGBA1C 6.0 (H) 06/30/2022   Lab Results  Component Value Date   VITAMINB12 443 06/30/2022   Lab Results  Component Value Date   TSH 2.030 02/02/2022     ASSESSMENT AND PLAN 62 y.o. year old female  has a past medical history of Anxiety, B12 deficiency, Chicken pox, Depression, Diverticulitis, Dizziness, Genital warts, GERD (gastroesophageal reflux disease), Hay fever,  Hyperlipidemia, Ovarian cyst, Parkinson's disease, Poor balance, Pre-diabetes, Sleep apnea, Stroke (HCC), UTI (lower urinary tract infection), and Vitamin D deficiency. here with   No diagnosis found.    SHAIA TASSONE is doing well on CPAP therapy. Compliance report reveals ***. *** was encouraged to continue using CPAP nightly and for greater than 4 hours each night. We will update supply orders as indicated. Risks of untreated sleep apnea review and education materials provided. Healthy lifestyle habits encouraged. *** will follow up in ***, sooner if needed. *** verbalizes understanding and agreement with this plan.    No orders of the defined types were placed in this encounter.    No orders of the defined types were placed in this encounter.     Shawnie Dapper, FNP-C 12/07/2022, 5:28 PM [facility] [address] [place], [place] [address] [phone]

## 2022-12-07 NOTE — Telephone Encounter (Signed)
I have sent duloxetine 30 mg to [facility] in [place].

## 2022-12-07 NOTE — Telephone Encounter (Signed)
pt called states she needs refill on the duloxetine. (appears that the transmission to the pharmacy failed) pt was last seen on 9-19 and next appt 6-6    E-Prescribing Status: Transmission to pharmacy failed (10/28/2022  6:35 PM EDT)   Message completed by Jomarie Longs, MD (10/28/2022 6:35 PM).

## 2022-12-07 NOTE — Patient Instructions (Incomplete)
Please continue using your CPAP regularly. While your insurance requires that you use CPAP at least 4 hours each night on 70% of the nights, I recommend, that you not skip any nights and use it throughout the night if you can. Getting used to CPAP and staying with the treatment long term does take time and patience and discipline. Untreated obstructive sleep apnea when it is moderate to severe can have an adverse impact on cardiovascular health and raise her risk for heart disease, arrhythmias, hypertension, congestive heart failure, stroke and diabetes. Untreated obstructive sleep apnea causes sleep disruption, nonrestorative sleep, and sleep deprivation. This can have an impact on your day to day functioning and cause daytime sleepiness and impairment of cognitive function, memory loss, mood disturbance, and problems focussing. Using CPAP regularly can improve these symptoms.  We will update supply orders, today. Your CPAP report look good. Please continue close follow up with your medical team. Consider discussion with your psychiatrist regarding the anxiety.   Follow up in 1 year

## 2022-12-07 NOTE — Telephone Encounter (Signed)
Pt.notified

## 2022-12-09 ENCOUNTER — Ambulatory Visit: Payer: Medicaid Other | Admitting: Family Medicine

## 2022-12-09 ENCOUNTER — Encounter: Payer: Self-pay | Admitting: Family Medicine

## 2022-12-09 VITALS — BP 134/70 | HR 70 | Ht 64.0 in | Wt 235.0 lb

## 2022-12-09 DIAGNOSIS — G4733 Obstructive sleep apnea (adult) (pediatric): Secondary | ICD-10-CM | POA: Diagnosis not present

## 2022-12-30 ENCOUNTER — Encounter: Payer: Self-pay | Admitting: Psychiatry

## 2022-12-30 ENCOUNTER — Ambulatory Visit (INDEPENDENT_AMBULATORY_CARE_PROVIDER_SITE_OTHER): Payer: Medicaid Other | Admitting: Psychiatry

## 2022-12-30 VITALS — BP 133/84 | HR 109 | Temp 97.4°F | Ht 64.0 in | Wt 227.0 lb

## 2022-12-30 DIAGNOSIS — F4010 Social phobia, unspecified: Secondary | ICD-10-CM | POA: Diagnosis not present

## 2022-12-30 DIAGNOSIS — F411 Generalized anxiety disorder: Secondary | ICD-10-CM | POA: Diagnosis not present

## 2022-12-30 DIAGNOSIS — F331 Major depressive disorder, recurrent, moderate: Secondary | ICD-10-CM

## 2022-12-30 DIAGNOSIS — F17200 Nicotine dependence, unspecified, uncomplicated: Secondary | ICD-10-CM

## 2022-12-30 MED ORDER — PROPRANOLOL HCL 10 MG PO TABS: 10.0000 mg | ORAL_TABLET | Freq: Three times a day (TID) | ORAL | 1 refills | Status: DC | PRN

## 2022-12-30 MED ORDER — CLONAZEPAM 0.5 MG PO TABS: 0.2500 mg | ORAL_TABLET | ORAL | 1 refills | Status: AC

## 2022-12-30 MED ORDER — BUPROPION HCL 100 MG PO TABS: 100.0000 mg | ORAL_TABLET | Freq: Every day | ORAL | 1 refills | Status: DC

## 2022-12-30 NOTE — Progress Notes (Signed)
BH MD OP Progress Note  12/30/2022 5:21 PM Kristina Savage  MRN:  [id number]  Chief Complaint:  Chief Complaint  Patient presents with   Follow-up   Depression   Anxiety   Medication Refill   HPI: Kristina Savage is a 62 year old Caucasian female, married, on SSI, lives in [place], has a history of MDD, GAD, social anxiety disorder, Parkinsonian symptoms, prediabetes, hyperlipidemia, history of left cerebellar stroke, history of hypovitaminosis, history of obstructive sleep apnea on CPAP was evaluated in office today.  Patient today reports she is currently struggling with multiple depression symptoms including sadness, low motivation, excessive sleepiness.  She continues to have multiple physical symptoms likely due to Parkinson's disease, currently on carbidopa levodopa.  She reports she has significant problems with her balance when she tries to walk.  She does have tremors.  All this does have an impact on her mood and makes her more depressed.  She is also worried about her health in general.  She does struggle with physical symptoms of anxiety including chest tightness on and off.  She however has not been using the clonazepam much.  Patient is currently compliant on the duloxetine.  Does have propranolol available which she uses as needed.  Patient denies any suicidality, homicidality or perceptual disturbances.  Patient denies any other concerns today.  Visit Diagnosis:    ICD-10-CM   1. MDD (major depressive disorder), recurrent episode, moderate (HCC)  F33.1 buPROPion (WELLBUTRIN) 100 MG tablet    clonazePAM (KLONOPIN) 0.5 MG tablet    2. GAD (generalized anxiety disorder)  F41.1 propranolol (INDERAL) 10 MG tablet    clonazePAM (KLONOPIN) 0.5 MG tablet    3. Social anxiety disorder  F40.10 propranolol (INDERAL) 10 MG tablet    4. Tobacco use disorder  F17.200       Past Psychiatric History: I have reviewed past psychiatric history from progress note on 09/01/2021.  Past  Medical History:  Past Medical History:  Diagnosis Date   Anxiety    B12 deficiency    Chicken pox    Depression    Diverticulitis    Dizziness    Genital warts    GERD (gastroesophageal reflux disease)    Hay fever    Hyperlipidemia    Ovarian cyst    Parkinson's disease    Dr.Shaw [place] and at [facility].  Functional/neurological condition.   Poor balance    Pre-diabetes    Sleep apnea    Stroke [facility])    UTI (lower urinary tract infection)    Vitamin D deficiency     Past Surgical History:  Procedure Laterality Date   CERVIX REMOVAL     Laser surgery, not complete removal   CESAREAN SECTION     CHONDROPLASTY Right 06/11/2020   Procedure: CHONDROPLASTY;  Surgeon: Beverely Low, MD;  Location: [facility];  Service: Orthopedics;  Laterality: Right;   KNEE ARTHROSCOPY WITH MEDIAL MENISECTOMY Right 06/11/2020   Procedure: KNEE ARTHROSCOPY WITH MEDIAL MENISECTOMY;  Surgeon: Beverely Low, MD;  Location:  SURGERY CENTER;  Service: Orthopedics;  Laterality: Right;    Family Psychiatric History: I have reviewed family psychiatric history from my progress note on 09/01/2021.  Family History:  Family History  Problem Relation Age of Onset   Arthritis Mother    Hypertension Mother    COPD Mother    Heart failure Mother    Heart disease Mother    Depression Mother    Eating disorder Mother    Obesity  Mother    Arthritis Father    Thyroid disease Father        thinks cancer   Parkinson's disease Father    Alcohol abuse Sister    Breast cancer Maternal Grandmother    Diabetes Paternal Grandmother    Parkinson's disease Paternal Grandmother    Diabetes Son    Cancer Maternal Aunt        Female cancer - not sure what   Breast cancer Maternal Aunt    Prostate cancer Maternal Uncle    Colon cancer Maternal Uncle 72    Social History: I have reviewed social history from progress note on 09/01/2021. Social History   Socioeconomic History    Marital status: Married    Spouse name: Kristina Savage   Number of children: 4   Years of education: High school   Highest education level: Not on file  Occupational History   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  Tobacco Use   Smoking status: Every Day    Packs/day: 0.25    Years: 30.00    Additional pack years: 0.00    Total pack years: 7.50    Types: Cigarettes    Passive exposure: Past   Smokeless tobacco: Never  Vaping Use   Vaping Use: Not on file  Substance and Sexual Activity   Alcohol use: Not Currently    Alcohol/week: 0.0 standard drinks of alcohol    Comment: once a year   Drug use: No   Sexual activity: Yes    Birth control/protection: Post-menopausal  Other Topics Concern   Not on file  Social History Narrative   04/24/20   From: [place] - moved to [place] to raise her family   Living: with husband, Kristina Savage (1991)         Family: 7 children - including step children - twin boys nearby and daughter nearby - 10 grandchildren      Enjoys: spend time with family      Exercise: knee limiting exercise - tries to walk   Diet: eats whatever      Safety   Seat belts: Yes    Guns: Yes  and secure   Safe in relationships: Yes    Social Determinants of Health   Financial Resource Strain: Not on file  Food Insecurity: Not on file  Transportation Needs: Not on file  Physical Activity: Not on file  Stress: Not on file  Social Connections: Not on file    Allergies:  Allergies  Allergen Reactions   Oxycodone-Acetaminophen Nausea And Vomiting   Percocet [Oxycodone-Acetaminophen] Nausea And Vomiting   Pollen Extract     Nasal congestion   Prozac [Fluoxetine] Nausea Only    Metabolic Disorder Labs: Lab Results  Component Value Date   HGBA1C 6.0 (H) 06/30/2022   No results found for: "PROLACTIN" Lab Results  Component Value Date   CHOL 141 02/02/2022   TRIG 67 02/02/2022   HDL 58 02/02/2022   CHOLHDL 2 03/10/2021   VLDL 16.4 03/10/2021   LDLCALC 69  02/02/2022   LDLCALC 73 03/10/2021   Lab Results  Component Value Date   TSH 2.030 02/02/2022   TSH 2.11 05/15/2020    Therapeutic Level Labs: No results found for: "LITHIUM" No results found for: "VALPROATE" No results found for: "CBMZ"  Current Medications: Current Outpatient Medications  Medication Sig Dispense Refill   aspirin EC 81 MG tablet Take 81 mg by mouth daily. Swallow whole.     atorvastatin (LIPITOR) 10  MG tablet Take 1 tablet (10 mg total) by mouth daily. 90 tablet 3   buPROPion (WELLBUTRIN) 100 MG tablet Take 1 tablet (100 mg total) by mouth daily with breakfast. 30 tablet 1   carbidopa-levodopa (SINEMET IR) 25-100 MG tablet Take 2 tablets by mouth 3 (three) times daily.     cyanocobalamin (CYANOCOBALAMIN) 500 MCG tablet 200-541mcg QD     DULoxetine (CYMBALTA) 30 MG capsule Take 1 capsule (30 mg total) by mouth 2 (two) times daily. 60 capsule 2   meloxicam (MOBIC) 15 MG tablet Take 1 tablet (15 mg total) by mouth daily. Take with food. Avoid other NSAIDs like: ibuprofen, motrin, aleve, naproxen, BC/Goody powders 15 tablet 0   Multiple Vitamin (MULTIVITAMIN) tablet Take 1 tablet by mouth daily.     clonazePAM (KLONOPIN) 0.5 MG tablet Take 0.5-1 tablets (0.25-0.5 mg total) by mouth as directed. Take half to 1 tablet 2-3 times a week for severe anxiety, agitation 15 tablet 1   propranolol (INDERAL) 10 MG tablet Take 1 tablet (10 mg total) by mouth 3 (three) times daily as needed. For severe anxiety 90 tablet 1   No current facility-administered medications for this visit.     Musculoskeletal: Strength & Muscle Tone: within normal limits Gait & Station: normal Patient leans: N/A  Psychiatric Specialty Exam: Review of Systems  Constitutional:  Positive for fatigue.  Neurological:        Balance problem  Psychiatric/Behavioral:  Positive for dysphoric mood and sleep disturbance. The patient is nervous/anxious.     Blood pressure 133/84, pulse (!) 109, temperature  (!) 97.4 F (36.3 C), temperature source Skin, height 5\' 4"  (1.626 m), weight 227 lb (103 kg).Body mass index is 38.96 kg/m.  General Appearance: Casual  Eye Contact:  Fair  Speech:  Clear and Coherent  Volume:  Normal  Mood:  Anxious and Depressed  Affect:  Congruent  Thought Process:  Goal Directed and Descriptions of Associations: Intact  Orientation:  Full (Time, Place, and Person)  Thought Content: Logical   Suicidal Thoughts:  No  Homicidal Thoughts:  No  Memory:  Immediate;   Fair Recent;   Fair Remote;   Fair  Judgement:  Fair  Insight:  Fair  Psychomotor Activity:  Normal  Concentration:  Concentration: Fair and Attention Span: Fair  Recall:  [employer] of Knowledge: Fair  Language: Fair  Akathisia:  No  Handed:  Right  AIMS (if indicated): not done  Assets:  Communication Skills Desire for Improvement Social Support  ADL's:  Intact  Cognition: WNL  Sleep:   excessive   Screenings: Geneticist, molecular Office Visit from 04/13/2022 in [facility] Office Visit from 11/24/2021 in [facility] Office Visit from 09/01/2021 in [facility]  AIMS Total Score 8 0 14      GAD-7    Flowsheet Row Office Visit from 12/30/2022 in [facility] Office Visit from 08/30/2022 in [facility] [place] Office Visit from 04/13/2022 in [facility] Office Visit from 12/23/2021 in [facility] Office Visit from 11/24/2021 in [facility]  Total GAD-7 Score 16 5 0 6 8      PHQ2-9    Flowsheet Row Office Visit from 12/30/2022 in [facility] Office Visit from 08/30/2022 in [facility] [place]  [place] [employer] from 06/08/2022 in [facility] [address] [facility] Office Visit from 04/13/2022 in [facility] Office Visit from 02/03/2022 in [facility]  PHQ-2 Total Score 6 2 0 0 3  PHQ-9 Total Score 20 6 0 -- 4      Flowsheet Row Office Visit from 12/30/2022 in [facility] Office Visit from 04/13/2022 in [facility] Office Visit from 02/03/2022 in [facility]  C-SSRS RISK CATEGORY No Risk No Risk No Risk        Assessment and Plan: RENITTA HEMRICK is a 62 year old Caucasian female with history of MDD, GAD, social anxiety disorder, 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  Patient with worsening depression and anxiety symptoms, will benefit from the following plan.  Plan  MDD-unstable Cymbalta 30 mg p.o. twice daily Start Wellbutrin 100 mg p.o. daily in the morning. Referral for CBT-provided resources.  GAD-unstable Continue Cymbalta 30 mg p.o. twice daily Referral for CBT Propranolol 10 mg p.o. 3 times daily as needed for anxiety attacks. Clonazepam 0.25 to 0.5 mg as needed for anxiety attacks-patient encouraged to use it as prescribed.  Social anxiety disorder-unstable Patient to establish care with therapist. Propranolol 10 mg p.o. 3 times daily as needed for anxiety attacks  Tobacco use disorder-unstable Patient to stop smoking.  Patient advised to follow-up with neurology for tremors, balance problems.  Follow-up in clinic in 4 weeks or sooner if needed.  Collaboration of Care: Collaboration of Care: Referral or follow-up with counselor/therapist AEB encouraged to establish care with therapist, encouraged to follow up with neurology.  Patient/Guardian was advised Release of  Information must be obtained prior to any record release in order to collaborate their care with an outside provider. Patient/Guardian was advised if they have not already done so to contact the registration department to sign all necessary forms in order for [place] to release information regarding their care.   Consent: Patient/Guardian gives verbal consent for treatment and assignment of benefits for services provided during this visit. Patient/Guardian expressed understanding and agreed to proceed.   This note was generated in part or whole with voice recognition software. Voice recognition is usually quite accurate but there are transcription errors that can and very often do occur. I apologize for any typographical errors that were not detected and corrected.      Jomarie Longs, MD 12/31/2022, 8:03 AM

## 2022-12-30 NOTE — Patient Instructions (Signed)
[email or web address]  www.psychologytoday  [employer] [email or web address] [address], [place], [place] [address]   [phone]  [facility], [facility] [email or web address] [address] [place], [place] [address]  [phone]   [facility] - [phone]  [facility] - [phone]  [facility] - [phone] counseling [phone]  [facility] [phone]   [email or web address] this clinician can offer telehealth and has a sliding scale option  [email or web address] this group also offers sliding scale rates and is based out of [place]   [employer] [facility] has interns who offer sliding scale rates and some of the full time clinicians do, as well. You complete their contact form on their website and the referrals coordinator will help to get connected to someone   Bupropion Tablets (Depression/Mood Disorders) What is this medication? BUPROPION (byoo PROE pee on) treats depression. It increases norepinephrine and dopamine in the brain, hormones that help regulate mood. It belongs to a group of medications called NDRIs. This medicine may be used for other purposes; ask your health care provider or pharmacist if you have questions. COMMON BRAND NAME(S): Wellbutrin What should I tell my care team before I take this medication? They need to know if you have any of these conditions: An eating disorder, such as anorexia or bulimia Bipolar disorder or psychosis Diabetes or high blood sugar, treated with medication Glaucoma Head injury or brain tumor Heart disease, previous heart attack, or irregular heart beat High blood pressure Kidney disease Liver disease Seizures Suicidal thoughts, plans, or attempt by you or a family member Tourette syndrome Weight loss An unusual or  allergic reaction to bupropion, other medications, foods, dyes, or preservatives Pregnant or trying to become pregnant Breastfeeding How should I use this medication? Take this medication by mouth with a glass of water. Follow the directions on the prescription label. You can take it with or without food. If it upsets your stomach, take it with food. Take your medication at regular intervals. Do not take your medication more often than directed. Do not stop taking this medication suddenly except upon the advice of your care team. Stopping this medication too quickly may cause serious side effects or your condition may worsen. A special MedGuide will be given to you by the pharmacist with each prescription and refill. Be sure to read this information carefully each time. Talk to your care team regarding the use of this medication in children. Special care may be needed. Overdosage: If you think you have taken too much of this medicine contact a poison control center or emergency room at once. NOTE: This medicine is only for you. Do not share this medicine with others. What if I miss a dose? If you miss a dose, take it as soon as you can. If it is less than four hours to your next dose, take only that dose and skip the missed dose. Do not take double or extra doses. What may interact with this medication? Do not take this medication with any of the following: Linezolid MAOIs, such as Azilect, Carbex, Eldepryl, Marplan, Nardil, and Parnate Methylene blue (injected into a vein) Other medications that contain bupropion, such as Zyban This medication may also interact with the following: Alcohol Certain medications for anxiety or sleep Certain medications for blood pressure, such as metoprolol, propranolol Certain medications for HIV or AIDS, such as efavirenz, lopinavir, nelfinavir, ritonavir Certain medications for irregular heartbeat,  such as propafenone, flecainide Certain medications for mental  health conditions Certain medications for Parkinson disease, such as amantadine, levodopa Certain medications for seizures, such as carbamazepine, phenytoin, phenobarbital Cimetidine Clopidogrel Cyclophosphamide Digoxin Furazolidone Isoniazid Nicotine Orphenadrine Procarbazine Steroid medications, such as prednisone or cortisone Stimulant medications for attention disorders, weight loss, or to stay awake Tamoxifen Theophylline Thiotepa Ticlopidine Tramadol Warfarin This list may not describe all possible interactions. Give your health care provider a list of all the medicines, herbs, non-prescription drugs, or dietary supplements you use. Also tell them if you smoke, drink alcohol, or use illegal drugs. Some items may interact with your medicine. What should I watch for while using this medication? Tell your care team if your symptoms do not get better or if they get worse. Visit your care team for regular checks on your progress. Because it may take several weeks to see the full effects of this medication, it is important to continue your treatment as prescribed. This medication may cause thoughts of suicide or depression. This includes sudden changes in mood, behaviors, or thoughts. These changes can happen at any time but are more common in the beginning of treatment or after a change in dose. Call your care team right away if you experience these thoughts or worsening depression. This medication may cause mood and behavior changes, such as anxiety, nervousness, irritability, hostility, restlessness, excitability, hyperactivity, or trouble sleeping. These changes can happen at any time but are more common in the beginning of treatment or after a change in dose. Call your care team right away if you notice any of these symptoms. This medication may cause serious skin reactions. They can happen weeks to months after starting the medication. Contact your care team right away if you notice  fevers or flu-like symptoms with a rash. The rash may be red or purple and then turn into blisters or peeling of the skin. You may also notice a red rash with swelling of the face, lips, or lymph nodes in your neck or under your arms. Avoid drinks that contain alcohol while taking this medication. Drinking large amounts of alcohol, using sleeping or anxiety medications, or quickly stopping the use of these agents while taking this medication may increase your risk for a seizure. This medication may affect your coordination, reaction time, or judgment. Do not drive or operate machinery until you know how this medication affects you. Do not take this medication close to bedtime. It may prevent you from sleeping. Your mouth may get dry. Chewing sugarless gum or sucking hard candy and drinking plenty of water may help. Contact your care team if the problem does not go away or is severe. What side effects may I notice from receiving this medication? Side effects that you should report to your care team as soon as possible: Allergic reactions--skin rash, itching, hives, swelling of the face, lips, tongue, or throat Increase in blood pressure Mood and behavior changes--anxiety, nervousness, confusion, hallucinations, irritability, hostility, thoughts of suicide or self-harm, worsening mood, feelings of depression Redness, blistering, peeling, or loosening of the skin, including inside the mouth Seizures Sudden eye pain or change in vision such as blurry vision, seeing halos around lights, vision loss Side effects that usually do not require medical attention (report to your care team if they continue or are bothersome): Constipation Dizziness Dry mouth Loss of appetite Nausea Tremors or shaking Trouble sleeping This list may not describe all possible side effects. Call your doctor for medical advice about side effects. You  may report side effects to FDA at 1-800-FDA-1088. Where should I keep my  medication? Keep out of the reach of children and pets. Store at room temperature between 20 and 25 degrees C (68 and 77 degrees F), away from direct sunlight and moisture. Keep tightly closed. Throw away any unused medication after the expiration date. NOTE: This sheet is a summary. It may not cover all possible information. If you have questions about this medicine, talk to your doctor, pharmacist, or health care provider.  2024 Elsevier/Gold Standard (2022-04-04 00:00:00)

## 2023-01-05 ENCOUNTER — Telehealth: Payer: Self-pay

## 2023-01-05 DIAGNOSIS — F331 Major depressive disorder, recurrent, moderate: Secondary | ICD-10-CM

## 2023-01-05 DIAGNOSIS — F4010 Social phobia, unspecified: Secondary | ICD-10-CM

## 2023-01-05 DIAGNOSIS — F411 Generalized anxiety disorder: Secondary | ICD-10-CM

## 2023-01-05 MED ORDER — DULOXETINE HCL 30 MG PO CPEP: 90.0000 mg | ORAL_CAPSULE | ORAL | 2 refills | Status: DC

## 2023-01-05 NOTE — Telephone Encounter (Signed)
Spoke to patient she is interesting in trying the Duloxetine I did advise her to stop the Wellbutrin she voiced understanding

## 2023-01-05 NOTE — Telephone Encounter (Signed)
Please advise patient to stop the wellbutrin. If she is interested in trial of higher dose of Duloxetine , we can try that . Please let her know .

## 2023-01-05 NOTE — Telephone Encounter (Signed)
Patient called stating that the Bupropion which she has been taking for about a week now is causing her to be irritable she is snapping and yelling at everyone she stated that her body feels tensed she feels that each day has been getting worse but today is really bad she did take the bupropion this morning she noticed that she did not have these issues before she took  the medication she actually felt good but after she took it she could tell that the agitation and irritability was coming back please advise

## 2023-01-05 NOTE — Telephone Encounter (Signed)
I have sent a higher dosage of Cymbalta-90 mg daily to [facility].

## 2023-01-13 ENCOUNTER — Ambulatory Visit: Payer: Medicaid Other | Admitting: Internal Medicine

## 2023-01-13 ENCOUNTER — Encounter: Payer: Self-pay | Admitting: Internal Medicine

## 2023-01-13 VITALS — BP 112/70 | HR 80 | Temp 96.8°F | Ht 64.0 in | Wt 236.0 lb

## 2023-01-13 DIAGNOSIS — B372 Candidiasis of skin and nail: Secondary | ICD-10-CM | POA: Diagnosis not present

## 2023-01-13 DIAGNOSIS — L821 Other seborrheic keratosis: Secondary | ICD-10-CM

## 2023-01-13 MED ORDER — KETOCONAZOLE 2 % EX CREA: 1.0000 | TOPICAL_CREAM | Freq: Two times a day (BID) | CUTANEOUS | 0 refills | Status: DC | PRN

## 2023-01-13 NOTE — Progress Notes (Signed)
Subjective:    Patient ID: Kristina Savage, female    DOB: 1960/10/19, 62 y.o.   MRN: [id number]  HPI Here with husband due to discoloration under her breasts  Has brown spots under her breasts Fairly long--but looks like a mole otherwise She is not sure how long they have been there---asked husband to check due to rash (and he saw them and felt it should be checked) Rash today--thinks it is due to breasts hanging down. Uses deodorant there at times  No itching or pains  Current Outpatient Medications on File Prior to Visit  Medication Sig Dispense Refill   aspirin EC 81 MG tablet Take 81 mg by mouth daily. Swallow whole.     atorvastatin (LIPITOR) 10 MG tablet Take 1 tablet (10 mg total) by mouth daily. 90 tablet 3   clonazePAM (KLONOPIN) 0.5 MG tablet Take 0.5-1 tablets (0.25-0.5 mg total) by mouth as directed. Take half to 1 tablet 2-3 times a week for severe anxiety, agitation 15 tablet 1   cyanocobalamin (CYANOCOBALAMIN) 500 MCG tablet 200-515mcg QD     DULoxetine (CYMBALTA) 30 MG capsule Take 3 capsules (90 mg total) by mouth as directed. Take 1 capsule daily morning and 2 capsules daily at bedtime 90 capsule 2   Multiple Vitamin (MULTIVITAMIN) tablet Take 1 tablet by mouth daily.     propranolol (INDERAL) 10 MG tablet Take 1 tablet (10 mg total) by mouth 3 (three) times daily as needed. For severe anxiety 90 tablet 1   carbidopa-levodopa (SINEMET IR) 25-100 MG tablet Take 2 tablets by mouth 3 (three) times daily.     [DISCONTINUED] PROVENTIL HFA 108 (90 BASE) MCG/ACT inhaler Inhale 2 puffs into the lungs daily as needed for shortness of breath.   0   No current facility-administered medications on file prior to visit.    Allergies  Allergen Reactions   Oxycodone-Acetaminophen Nausea And Vomiting   Percocet [Oxycodone-Acetaminophen] Nausea And Vomiting   Pollen Extract     Nasal congestion   Prozac [Fluoxetine] Nausea Only    Past Medical History:  Diagnosis Date    Anxiety    B12 deficiency    Chicken pox    Depression    Diverticulitis    Dizziness    Genital warts    GERD (gastroesophageal reflux disease)    Hay fever    Hyperlipidemia    Ovarian cyst    Parkinson's disease    Dr.Shaw [place] and at [facility].  Functional/neurological condition.   Poor balance    Pre-diabetes    Sleep apnea    Stroke [facility])    UTI (lower urinary tract infection)    Vitamin D deficiency     Past Surgical History:  Procedure Laterality Date   CERVIX REMOVAL     Laser surgery, not complete removal   CESAREAN SECTION     CHONDROPLASTY Right 06/11/2020   Procedure: CHONDROPLASTY;  Surgeon: Beverely Low, MD;  Location: [facility];  Service: Orthopedics;  Laterality: Right;   KNEE ARTHROSCOPY WITH MEDIAL MENISECTOMY Right 06/11/2020   Procedure: KNEE ARTHROSCOPY WITH MEDIAL MENISECTOMY;  Surgeon: Beverely Low, MD;  Location: [facility];  Service: Orthopedics;  Laterality: Right;    Family History  Problem Relation Age of Onset   Arthritis Mother    Hypertension Mother    COPD Mother    Heart failure Mother    Heart disease Mother    Depression Mother    Eating disorder Mother  Obesity Mother    Arthritis Father    Thyroid disease Father        thinks cancer   Parkinson's disease Father    Alcohol abuse Sister    Breast cancer Maternal Grandmother    Diabetes Paternal Grandmother    Parkinson's disease Paternal Grandmother    Diabetes Son    Cancer Maternal Aunt        Female cancer - not sure what   Breast cancer Maternal Aunt    Prostate cancer Maternal Uncle    Colon cancer Maternal Uncle 21    Social History   Socioeconomic History   Marital status: Married    Spouse name: Theodoro Grist   Number of children: 4   Years of education: High school   Highest education level: Not on file  Occupational History   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  Tobacco Use   Smoking status: Every Day    Packs/day:  0.25    Years: 30.00    Additional pack years: 0.00    Total pack years: 7.50    Types: Cigarettes    Passive exposure: Past   Smokeless tobacco: Never  Vaping Use   Vaping Use: Not on file  Substance and Sexual Activity   Alcohol use: Not Currently    Alcohol/week: 0.0 standard drinks of alcohol    Comment: once a year   Drug use: No   Sexual activity: Yes    Birth control/protection: Post-menopausal  Other Topics Concern   Not on file  Social History Narrative   04/24/20   From: [place] - moved to [place] to raise her family   Living: with husband, Theodoro Grist (1991)         Family: 7 children - including step children - twin boys nearby and daughter nearby - 10 grandchildren      Enjoys: spend time with family      Exercise: knee limiting exercise - tries to walk   Diet: eats whatever      Safety   Seat belts: Yes    Guns: Yes  and secure   Safe in relationships: Yes    Social Determinants of Health   Financial Resource Strain: Not on file  Food Insecurity: Not on file  Transportation Needs: Not on file  Physical Activity: Not on file  Stress: Not on file  Social Connections: Not on file  Intimate Partner Violence: Not on file     Review of Systems No rash under arms or in groin    Objective:   Physical Exam Genitourinary:    Comments: Mild redness under left breast Skin:    Comments: Scattered seborrheic keratoses on the lower part of breasts            Assessment & Plan:

## 2023-01-13 NOTE — Assessment & Plan Note (Signed)
Mild under breast Will give ketoconazole cream for prn use

## 2023-01-13 NOTE — Assessment & Plan Note (Signed)
Reassured No action needed 

## 2023-01-30 ENCOUNTER — Other Ambulatory Visit: Payer: Self-pay | Admitting: Psychiatry

## 2023-01-30 DIAGNOSIS — F4010 Social phobia, unspecified: Secondary | ICD-10-CM

## 2023-01-30 DIAGNOSIS — F331 Major depressive disorder, recurrent, moderate: Secondary | ICD-10-CM

## 2023-01-30 DIAGNOSIS — F411 Generalized anxiety disorder: Secondary | ICD-10-CM

## 2023-02-17 ENCOUNTER — Ambulatory Visit: Payer: Medicaid Other | Admitting: Psychiatry

## 2023-02-17 ENCOUNTER — Encounter: Payer: Self-pay | Admitting: Internal Medicine

## 2023-02-17 ENCOUNTER — Ambulatory Visit: Payer: Medicaid Other | Admitting: Internal Medicine

## 2023-02-17 VITALS — BP 110/80 | HR 75 | Temp 97.1°F | Ht 64.0 in | Wt 233.0 lb

## 2023-02-17 DIAGNOSIS — S8000XA Contusion of unspecified knee, initial encounter: Secondary | ICD-10-CM | POA: Insufficient documentation

## 2023-02-17 DIAGNOSIS — M25462 Effusion, left knee: Secondary | ICD-10-CM | POA: Diagnosis not present

## 2023-02-17 MED ORDER — TRIAMCINOLONE ACETONIDE 40 MG/ML IJ SUSP
40.0000 mg | Freq: Once | INTRAMUSCULAR | Status: AC
  Administered 2023-02-17: 40 mg via INTRA_ARTICULAR

## 2023-02-17 MED ORDER — TRIAMCINOLONE ACETONIDE 40 MG/ML IJ SUSP: 40.0000 mg | Freq: Once | INTRAMUSCULAR | Status: DC

## 2023-02-17 NOTE — Assessment & Plan Note (Signed)
Left much more than right Most of the pain seems to be from the effusion though Nothing to really suggest fracture

## 2023-02-17 NOTE — Addendum Note (Signed)
Addended by: Eual Fines on: 02/17/2023 04:46 PM   Modules accepted: Orders

## 2023-02-17 NOTE — Progress Notes (Signed)
Subjective:    Patient ID: Kristina Savage, female    DOB: [birth year], 62 y.o.   MRN: [id number]  HPI Here due to left knee pain  Larey Seat about 2 weeks ago---cleaning a carpet and moved onto tile Legs went out and landed both knees Needed help to get up Had to sit for a while--then able to get around  Was bruised and able to walk on it Milder pain in right knee But it is really painful and tight---even when off it at the end of the day Has had increased swelling all the way to your foot Pain can be bad first thing in the morning also  Elevation/heat/ice--not helping Using ibuprofen 400mg  bid   Review of Systems     Current Outpatient Medications on File Prior to Visit  Medication Sig Dispense Refill   aspirin EC 81 MG tablet Take 81 mg by mouth daily. Swallow whole.     atorvastatin (LIPITOR) 10 MG tablet Take 1 tablet (10 mg total) by mouth daily. 90 tablet 3   clonazePAM (KLONOPIN) 0.5 MG tablet Take 0.5-1 tablets (0.25-0.5 mg total) by mouth as directed. Take half to 1 tablet 2-3 times a week for severe anxiety, agitation 15 tablet 1   cyanocobalamin (CYANOCOBALAMIN) 500 MCG tablet 200-536mcg QD     DULoxetine (CYMBALTA) 30 MG capsule TAKE 1 CAPSULE BY MOUTH DAILY IN THE MORNING AND 2 CAPSULES DAILY AT BEDTIME AS DIRECTED 270 capsule 0   ketoconazole (NIZORAL) 2 % cream Apply 1 Application topically 2 (two) times daily as needed for irritation. 60 g 0   Multiple Vitamin (MULTIVITAMIN) tablet Take 1 tablet by mouth daily.     propranolol (INDERAL) 10 MG tablet Take 1 tablet (10 mg total) by mouth 3 (three) times daily as needed. For severe anxiety 90 tablet 1   carbidopa-levodopa (SINEMET IR) 25-100 MG tablet Take 2 tablets by mouth 3 (three) times daily.     [DISCONTINUED] PROVENTIL HFA 108 (90 BASE) MCG/ACT inhaler Inhale 2 puffs into the lungs daily as needed for shortness of breath.   0   No current facility-administered medications on file prior to visit.    Allergies   Allergen Reactions   Oxycodone-Acetaminophen Nausea And Vomiting   Percocet [Oxycodone-Acetaminophen] Nausea And Vomiting   Pollen Extract     Nasal congestion   Prozac [Fluoxetine] Nausea Only    Past Medical History:  Diagnosis Date   Anxiety    B12 deficiency    Chicken pox    Depression    Diverticulitis    Dizziness    Genital warts    GERD (gastroesophageal reflux disease)    Hay fever    Hyperlipidemia    Ovarian cyst    Parkinson's disease    Dr.Shaw [place]  and at [facility].  Functional/neurological condition.   Poor balance    Pre-diabetes    Sleep apnea    Stroke [facility])    UTI (lower urinary tract infection)    Vitamin D deficiency     Past Surgical History:  Procedure Laterality Date   CERVIX REMOVAL     Laser surgery, not complete removal   CESAREAN SECTION     CHONDROPLASTY Right 06/11/2020   Procedure: CHONDROPLASTY;  Surgeon: Beverely Low, MD;  Location: [facility];  Service: Orthopedics;  Laterality: Right;   KNEE ARTHROSCOPY WITH MEDIAL MENISECTOMY Right 06/11/2020   Procedure: KNEE ARTHROSCOPY WITH MEDIAL MENISECTOMY;  Surgeon: Beverely Low, MD;  Location: [facility];  Service: Orthopedics;  Laterality: Right;    Family History  Problem Relation Age of Onset   Arthritis Mother    Hypertension Mother    COPD Mother    Heart failure Mother    Heart disease Mother    Depression Mother    Eating disorder Mother    Obesity Mother    Arthritis Father    Thyroid disease Father        thinks cancer   Parkinson's disease Father    Alcohol abuse Sister    Breast cancer Maternal Grandmother    Diabetes Paternal Grandmother    Parkinson's disease Paternal Grandmother    Diabetes Son    Cancer Maternal Aunt        Female cancer - not sure what   Breast cancer Maternal Aunt    Prostate cancer Maternal Uncle    Colon cancer Maternal Uncle 57    Social History   Socioeconomic History   Marital status: Married     Spouse name: Theodoro Grist   Number of children: 4   Years of education: High school   Highest education level: Not on file  Occupational History   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  Tobacco Use   Smoking status: Every Day    Current packs/day: 0.25    Average packs/day: 0.3 packs/day for 30.0 years (7.5 ttl pk-yrs)    Types: Cigarettes    Passive exposure: Past   Smokeless tobacco: Never  Vaping Use   Vaping status: Not on file  Substance and Sexual Activity   Alcohol use: Not Currently    Alcohol/week: 0.0 standard drinks of alcohol    Comment: once a year   Drug use: No   Sexual activity: Yes    Birth control/protection: Post-menopausal  Other Topics Concern   Not on file  Social History Narrative   04/24/20   From: [place] - moved to [place] to raise her family   Living: with husband, Theodoro Grist (1991)         Family: 7 children - including step children - twin boys nearby and daughter nearby - 10 grandchildren      Enjoys: spend time with family      Exercise: knee limiting exercise - tries to walk   Diet: eats whatever      Safety   Seat belts: Yes    Guns: Yes  and secure   Safe in relationships: Yes    Social Determinants of Health   Financial Resource Strain: Not on file  Food Insecurity: Not on file  Transportation Needs: Not on file  Physical Activity: Not on file  Stress: Not on file  Social Connections: Not on file  Intimate Partner Violence: Not on file    Physical Exam Musculoskeletal:     Comments: Significant effusion left knee No striking patellar tenderness Passive ROM okay No ligament or meniscus findings  Neurological:     Comments: Mildly antalgic gait No leg weakness     No fever No N/V Able to eat fine        Assessment & Plan:

## 2023-02-17 NOTE — Assessment & Plan Note (Addendum)
Seems to have moderate effusion Biggest pain seems to be tightness, etc---so could be from this Discussed options and she gave verbal consent for athrocentesis  Sterile prep Ethyl chloride and 2cc 1% lidocaine local 30cc normal appearing synovial fluid removed from 22 G in lateral space 5cc !% lidocaine/40mg  triamcinolone instilled Sig improvement in ROM and resolution of tightness Discussed home care

## 2023-02-28 ENCOUNTER — Encounter: Payer: Medicaid Other | Admitting: Family

## 2023-03-22 ENCOUNTER — Other Ambulatory Visit: Payer: Self-pay | Admitting: Nurse Practitioner

## 2023-03-22 ENCOUNTER — Other Ambulatory Visit: Payer: Self-pay | Admitting: Internal Medicine

## 2023-03-22 DIAGNOSIS — R071 Chest pain on breathing: Secondary | ICD-10-CM

## 2023-04-01 ENCOUNTER — Ambulatory Visit: Payer: Medicaid Other | Admitting: Psychiatry

## 2023-04-06 ENCOUNTER — Encounter: Payer: Self-pay | Admitting: Family

## 2023-04-06 ENCOUNTER — Ambulatory Visit (INDEPENDENT_AMBULATORY_CARE_PROVIDER_SITE_OTHER): Payer: Self-pay | Admitting: Family

## 2023-04-06 VITALS — HR 92 | Temp 97.9°F | Ht 64.0 in | Wt 232.2 lb

## 2023-04-06 DIAGNOSIS — Z20822 Contact with and (suspected) exposure to covid-19: Secondary | ICD-10-CM

## 2023-04-06 DIAGNOSIS — R52 Pain, unspecified: Secondary | ICD-10-CM

## 2023-04-06 DIAGNOSIS — L0291 Cutaneous abscess, unspecified: Secondary | ICD-10-CM

## 2023-04-06 LAB — CBC WITH DIFFERENTIAL/PLATELET
Basophils Absolute: 0 10*3/uL (ref 0.0–0.1)
Basophils Relative: 0.7 % (ref 0.0–3.0)
Eosinophils Absolute: 0.4 10*3/uL (ref 0.0–0.7)
Eosinophils Relative: 5.8 % — ABNORMAL HIGH (ref 0.0–5.0)
HCT: 42.5 % (ref 36.0–46.0)
Hemoglobin: 13.5 g/dL (ref 12.0–15.0)
Lymphocytes Relative: 21.4 % (ref 12.0–46.0)
Lymphs Abs: 1.5 10*3/uL (ref 0.7–4.0)
MCHC: 31.7 g/dL (ref 30.0–36.0)
MCV: 85.5 fl (ref 78.0–100.0)
Monocytes Absolute: 0.5 10*3/uL (ref 0.1–1.0)
Monocytes Relative: 6.5 % (ref 3.0–12.0)
Neutro Abs: 4.8 10*3/uL (ref 1.4–7.7)
Neutrophils Relative %: 65.6 % (ref 43.0–77.0)
Platelets: 272 10*3/uL (ref 150.0–400.0)
RBC: 4.97 Mil/uL (ref 3.87–5.11)
RDW: 14.8 % (ref 11.5–15.5)
WBC: 7.2 10*3/uL (ref 4.0–10.5)

## 2023-04-06 LAB — POC COVID19 BINAXNOW: SARS Coronavirus 2 Ag: NEGATIVE

## 2023-04-06 MED ORDER — DOXYCYCLINE HYCLATE 100 MG PO TABS: 100.0000 mg | ORAL_TABLET | Freq: Two times a day (BID) | ORAL | 0 refills | Status: AC

## 2023-04-06 NOTE — Assessment & Plan Note (Signed)
Pt advised to:  Please monitor site for worsening signs/symptoms of infection to include: increasing redness, increasing tenderness, increase in size, and or pustulant drainage from site. If this is to occur please let me know immediately.  Wound culture swab today pending results.  RX doxycycline 100 mg po bid x 10 days Warm compresses advised repeatedly throughout the day

## 2023-04-06 NOTE — Progress Notes (Signed)
Established Patient Office Visit  Subjective:   Patient ID: Kristina Savage, female    DOB: 1960-12-06  Age: 62 y.o. MRN: [id number]  CC:  Chief Complaint  Patient presents with   Hyperlipidemia    HPI: Kristina Savage is a 62 y.o. female presenting on 04/06/2023 for Hyperlipidemia  Over the last few days started with bil abscesses. She had little papular spots but started to mess and pick at it it due to being increasingly stressed . They are very sore, putting her glasses on hurts, and trouble sleeping due to difficulty lying on that side. No fever but she is not feeing well, very achy. She feels as though she has a fever that started last night but thermometer was fine.   Has some slight sinus pressure under the eyes, right eye more than left. Felt as though her right eye was 'shut' this am. No change in vision. No discharge from the eyes but does feel she is tearing up out of the eye.      ROS: Negative unless specifically indicated above in HPI.   Relevant past medical history reviewed and updated as indicated.   Allergies and medications reviewed and updated.   Current Outpatient Medications:    aspirin EC 81 MG tablet, Take 81 mg by mouth daily. Swallow whole., Disp: , Rfl:    atorvastatin (LIPITOR) 10 MG tablet, Take 1 tablet (10 mg total) by mouth daily., Disp: 90 tablet, Rfl: 3   clonazePAM (KLONOPIN) 0.5 MG tablet, Take 0.5-1 tablets (0.25-0.5 mg total) by mouth as directed. Take half to 1 tablet 2-3 times a week for severe anxiety, agitation, Disp: 15 tablet, Rfl: 1   cyanocobalamin (CYANOCOBALAMIN) 500 MCG tablet, 200-596mcg QD, Disp: , Rfl:    doxycycline (VIBRA-TABS) 100 MG tablet, Take 1 tablet (100 mg total) by mouth 2 (two) times daily for 10 days., Disp: 20 tablet, Rfl: 0   DULoxetine (CYMBALTA) 30 MG capsule, TAKE 1 CAPSULE BY MOUTH DAILY IN THE MORNING AND 2 CAPSULES DAILY AT BEDTIME AS DIRECTED, Disp: 270 capsule, Rfl: 0   ketoconazole (NIZORAL) 2 % cream, APPLY  TOPICALLY TWICE A DAY AS NEEDED FOR IRRITATION, Disp: 60 g, Rfl: 0   Multiple Vitamin (MULTIVITAMIN) tablet, Take 1 tablet by mouth daily., Disp: , Rfl:    propranolol (INDERAL) 10 MG tablet, Take 1 tablet (10 mg total) by mouth 3 (three) times daily as needed. For severe anxiety, Disp: 90 tablet, Rfl: 1   carbidopa-levodopa (SINEMET IR) 25-100 MG tablet, Take 2 tablets by mouth 3 (three) times daily., Disp: , Rfl:   Allergies  Allergen Reactions   Oxycodone-Acetaminophen Nausea And Vomiting   Percocet [Oxycodone-Acetaminophen] Nausea And Vomiting   Pollen Extract     Nasal congestion   Prozac [Fluoxetine] Nausea Only    Objective:   Pulse 92   Temp 97.9 F (36.6 C) (Temporal)   Ht 5\' 4"  (1.626 m)   Wt 232 lb 3.2 oz (105.3 kg)   SpO2 97%   BMI 39.86 kg/m    Physical Exam Constitutional:      General: She is not in acute distress.    Appearance: Normal appearance. She is normal weight. She is not ill-appearing, toxic-appearing or diaphoretic.  HENT:     Head: Normocephalic.     Right Ear: Tympanic membrane normal.     Left Ear: Tympanic membrane normal.     Nose: Nose normal.     Mouth/Throat:     Mouth: Mucous membranes  are dry.     Pharynx: No oropharyngeal exudate or posterior oropharyngeal erythema.  Eyes:     Extraocular Movements: Extraocular movements intact.     Pupils: Pupils are equal, round, and reactive to light.  Cardiovascular:     Rate and Rhythm: Normal rate and regular rhythm.     Pulses: Normal pulses.     Heart sounds: Normal heart sounds.  Pulmonary:     Effort: Pulmonary effort is normal.     Breath sounds: Normal breath sounds.  Musculoskeletal:     Cervical back: Normal range of motion.  Skin:    Comments: Abscess right anterior forehead with raised erythematic lesion abscess with slight yellow pustular discharge very tender to touch.  Right temporal aspect with similar findings, no yellow discharge.   Neurological:     General: No focal  deficit present.     Mental Status: She is alert and oriented to person, place, and time. Mental status is at baseline.  Psychiatric:        Mood and Affect: Mood normal.        Behavior: Behavior normal.        Thought Content: Thought content normal.        Judgment: Judgment normal.      Assessment & Plan:  Abscess Assessment & Plan: Pt advised to:  Please monitor site for worsening signs/symptoms of infection to include: increasing redness, increasing tenderness, increase in size, and or pustulant drainage from site. If this is to occur please let me know immediately.  Wound culture swab today pending results.  RX doxycycline 100 mg po bid x 10 days Warm compresses advised repeatedly throughout the day   Orders: -     CBC with Differential/Platelet -     Doxycycline Hyclate; Take 1 tablet (100 mg total) by mouth 2 (two) times daily for 10 days.  Dispense: 20 tablet; Refill: 0 -     WOUND CULTURE  Body aches -     CBC with Differential/Platelet  Generalized body aches  Contact with and (suspected) exposure to covid-19 -     POC COVID-19 [employer]     Follow up plan: Return in about 1 week (around 04/13/2023) for f/u abscess.  Mort Sawyers, FNP

## 2023-04-09 LAB — WOUND CULTURE
MICRO NUMBER:: 15452118
SPECIMEN QUALITY:: ADEQUATE

## 2023-04-22 ENCOUNTER — Encounter (HOSPITAL_COMMUNITY): Payer: Self-pay

## 2023-04-22 ENCOUNTER — Ambulatory Visit (INDEPENDENT_AMBULATORY_CARE_PROVIDER_SITE_OTHER): Payer: Self-pay

## 2023-04-22 ENCOUNTER — Ambulatory Visit (HOSPITAL_COMMUNITY)
Admission: EM | Admit: 2023-04-22 | Discharge: 2023-04-22 | Disposition: A | Discharge status: Home / Self Care | Payer: Self-pay | Attending: Family Medicine | Admitting: Family Medicine

## 2023-04-22 DIAGNOSIS — M25511 Pain in right shoulder: Secondary | ICD-10-CM

## 2023-04-22 MED ORDER — HYDROCODONE-ACETAMINOPHEN 5-325 MG PO TABS
ORAL_TABLET | ORAL | Status: AC
  Filled 2023-04-22: qty 1

## 2023-04-22 MED ORDER — HYDROCODONE-ACETAMINOPHEN 5-325 MG PO TABS
1.0000 | ORAL_TABLET | Freq: Once | ORAL | Status: AC
  Administered 2023-04-22: 1 via ORAL

## 2023-04-22 MED ORDER — PREDNISONE 20 MG PO TABS: 40.0000 mg | ORAL_TABLET | Freq: Every day | ORAL | 0 refills | Status: AC

## 2023-04-22 MED ORDER — HYDROCODONE-ACETAMINOPHEN 5-325 MG PO TABS: 1.0000 | ORAL_TABLET | Freq: Four times a day (QID) | ORAL | 0 refills | Status: DC | PRN

## 2023-04-22 NOTE — ED Provider Notes (Signed)
[facility]    CSN: [id number] Arrival date & time: 04/22/23  1820      History   Chief Complaint Chief Complaint  Patient presents with   Shoulder Pain    Right shoulder pain. X1 month    HPI EMMELY BITTINGER is a 62 y.o. female.    Shoulder Pain Here for right shoulder pain. Has been constantly aching for 1 month. Began right after some repetitive motion when steam cleaning a carpet.  Has not been using her right arm much.    Past Medical History:  Diagnosis Date   Anxiety    B12 deficiency    Chicken pox    Depression    Diverticulitis    Dizziness    Genital warts    GERD (gastroesophageal reflux disease)    Hay fever    Hyperlipidemia    Ovarian cyst    Parkinson's disease    Dr.Shaw  [place] and at [facility] [place].  Functional/neurological condition.   Poor balance    Pre-diabetes    Sleep apnea    Stroke [facility])    UTI (lower urinary tract infection)    Vitamin D deficiency     Patient Active Problem List   Diagnosis Date Noted   Contact with and (suspected) exposure to covid-19 04/06/2023   Effusion of left knee joint 02/17/2023   Seborrheic keratoses 01/13/2023   Class 2 severe obesity due to excess calories with serious comorbidity and body mass index (BMI) of 38.0 to 38.9 in adult (HCC) 08/30/2022   B12 deficiency 06/30/2022   Parkinson's disease 06/30/2022   Tobacco abuse 06/30/2022   Vitamin D deficiency 02/02/2022   Mood disorder (HCC)- emotional eating 02/02/2022   MDD (major depressive disorder), recurrent episode, moderate (HCC) 09/23/2021   GAD (generalized anxiety disorder) 09/23/2021   Social anxiety disorder 09/23/2021   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   Cerebral vascular disease 08/21/2020   Mixed hyperlipidemia 08/21/2020   Balance problem 08/21/2020   Benign paroxysmal positional vertigo 08/21/2020   Postural dizziness 08/21/2020   Tobacco use disorder 06/02/2020   Functional tremor 05/15/2020   Memory change 05/15/2020    Prediabetes 04/24/2020   Diverticulitis of colon 08/13/2014   Ovarian cyst 08/13/2014    Past Surgical History:  Procedure Laterality Date   CERVIX REMOVAL     Laser surgery, not complete removal   CESAREAN SECTION     CHONDROPLASTY Right 06/11/2020   Procedure: CHONDROPLASTY;  Surgeon: Beverely Low, MD;  Location: [facility];  Service: Orthopedics;  Laterality: Right;   KNEE ARTHROSCOPY WITH MEDIAL MENISECTOMY Right 06/11/2020   Procedure: KNEE ARTHROSCOPY WITH MEDIAL MENISECTOMY;  Surgeon: Beverely Low, MD;  Location: [facility];  Service: Orthopedics;  Laterality: Right;    OB History     Gravida  4   Para  4   Term  4   Preterm      AB      Living  4      SAB      IAB      Ectopic      Multiple      Live Births  4            Home Medications    Prior to Admission medications   Medication Sig Start Date End Date Taking? Authorizing Provider  aspirin EC 81 MG tablet Take 81 mg by mouth daily. Swallow whole.   Yes [provider]  atorvastatin (LIPITOR) 10 MG tablet Take  1 tablet (10 mg total) by mouth daily. 10/19/22  Yes Dugal, Wyatt Mage, FNP  clonazePAM (KLONOPIN) 0.5 MG tablet Take 0.5-1 tablets (0.25-0.5 mg total) by mouth as directed. Take half to 1 tablet 2-3 times a week for severe anxiety, agitation 12/30/22  Yes Eappen, Levin Bacon, MD  cyanocobalamin (CYANOCOBALAMIN) 500 MCG tablet 200-566mcg QD 02/16/22  Yes Opalski, Deborah, DO  DULoxetine (CYMBALTA) 30 MG capsule TAKE 1 CAPSULE BY MOUTH DAILY IN THE MORNING AND 2 CAPSULES DAILY AT BEDTIME AS DIRECTED 01/30/23  Yes Eappen, Levin Bacon, MD  HYDROcodone-acetaminophen (NORCO/VICODIN) 5-325 MG tablet Take 1 tablet by mouth every 6 (six) hours as needed (pain). 04/22/23  Yes Zenia Resides, MD  ketoconazole (NIZORAL) 2 % cream APPLY TOPICALLY TWICE A DAY AS NEEDED FOR IRRITATION 03/23/23  Yes Dugal, Tabitha, FNP  Multiple Vitamin (MULTIVITAMIN) tablet Take 1 tablet by  mouth daily.   Yes [provider]  predniSONE (DELTASONE) 20 MG tablet Take 2 tablets (40 mg total) by mouth daily with breakfast for 5 days. 04/22/23 04/27/23 Yes Zenia Resides, MD  propranolol (INDERAL) 10 MG tablet Take 1 tablet (10 mg total) by mouth 3 (three) times daily as needed. For severe anxiety 12/30/22  Yes Eappen, Levin Bacon, MD  carbidopa-levodopa (SINEMET IR) 25-100 MG tablet Take 2 tablets by mouth 3 (three) times daily. 10/13/21 12/30/22  [provider]  PROVENTIL HFA 108 (90 BASE) MCG/ACT inhaler Inhale 2 puffs into the lungs daily as needed for shortness of breath.  05/17/14 05/01/19  [provider]    Family History Family History  Problem Relation Age of Onset   Arthritis Mother    Hypertension Mother    COPD Mother    Heart failure Mother    Heart disease Mother    Depression Mother    Eating disorder Mother    Obesity Mother    Arthritis Father    Thyroid disease Father        thinks cancer   Parkinson's disease Father    Alcohol abuse Sister    Breast cancer Maternal Grandmother    Diabetes Paternal Grandmother    Parkinson's disease Paternal Grandmother    Diabetes Son    Cancer Maternal Aunt        Female cancer - not sure what   Breast cancer Maternal Aunt    Prostate cancer Maternal Uncle    Colon cancer Maternal Uncle 85    Social History Social History   Tobacco Use   Smoking status: Every Day    Current packs/day: 0.25    Average packs/day: 0.3 packs/day for 30.0 years (7.5 ttl pk-yrs)    Types: Cigarettes    Passive exposure: Past   Smokeless tobacco: Never  Substance Use Topics   Alcohol use: Not Currently    Alcohol/week: 0.0 standard drinks of alcohol    Comment: once a year   Drug use: No     Allergies   Oxycodone-acetaminophen, Percocet [oxycodone-acetaminophen], Pollen extract, and Prozac [fluoxetine]   Review of Systems Review of Systems   Physical Exam Triage Vital Signs ED Triage Vitals   Encounter Vitals Group     BP 04/22/23 1908 122/81     Systolic BP Percentile --      Diastolic BP Percentile --      Pulse Rate 04/22/23 1908 74     Resp 04/22/23 1908 16     Temp 04/22/23 1908 98.4 F (36.9 C)     Temp Source 04/22/23 1908 Oral  SpO2 04/22/23 1908 97 %     Weight 04/22/23 1906 232 lb (105.2 kg)     Height 04/22/23 1906 5\' 4"  (1.626 m)     Head Circumference --      Peak Flow --      Pain Score 04/22/23 1905 0     Pain Loc --      Pain Education --      Exclude from Growth Chart --    No data found.  Updated Vital Signs BP 122/81 (BP Location: Left Arm)   Pulse 74   Temp 98.4 F (36.9 C) (Oral)   Resp 16   Ht 5\' 4"  (1.626 m)   Wt 105.2 kg   SpO2 97%   BMI 39.82 kg/m   Visual Acuity Right Eye Distance:   Left Eye Distance:   Bilateral Distance:    Right Eye Near:   Left Eye Near:    Bilateral Near:     Physical Exam   UC Treatments / Results  Labs (all labs ordered are listed, but only abnormal results are displayed) Labs Reviewed - No data to display  EKG   Radiology No results found.  Procedures Procedures (including critical care time)  Medications Ordered in UC Medications  HYDROcodone-acetaminophen (NORCO/VICODIN) 5-325 MG per tablet 1 tablet (has no administration in time range)    Initial Impression / Assessment and Plan / UC Course  I have reviewed the triage vital signs and the nursing notes.  Pertinent labs & imaging results that were available during my care of the patient were reviewed by me and considered in my medical decision making (see chart for details).        X-rays by my review do not show any acute bony abnormalities.  Patient is advised of radiology over read.  She is given 1 dose of hydrocodone here in the office.  5-day burst of prednisone is sent in to the pharmacy and I have printed her hydrocodone prescription since often recently pharmacies have had that on backorder. She is also given  contact information for orthopedics.  Final Clinical Impressions(s) / UC Diagnoses   Final diagnoses:  Acute pain of right shoulder     Discharge Instructions      Your x-ray showed some degenerative changes but no fractures  Take prednisone 20 mg--2 daily for 5 days  Hydrocodone 5 mg--1 tablet every 6 hours as needed for pain.  This is best taken with food.  It can cause sleepiness or dizziness You have been given 1 dose of the hydrocodone tonight.     ED Prescriptions     Medication Sig Dispense Auth. Provider   HYDROcodone-acetaminophen (NORCO/VICODIN) 5-325 MG tablet Take 1 tablet by mouth every 6 (six) hours as needed (pain). 12 tablet Tiea Manninen, Janace Aris, MD   predniSONE (DELTASONE) 20 MG tablet Take 2 tablets (40 mg total) by mouth daily with breakfast for 5 days. 10 tablet Marlinda Mike Janace Aris, MD      I have reviewed the PDMP during this encounter.   Zenia Resides, MD 04/22/23 2005

## 2023-04-22 NOTE — ED Triage Notes (Signed)
Pt states that she has right shoulder pain. X1 month.  Pt denies any injury

## 2023-04-22 NOTE — Discharge Instructions (Signed)
Your x-ray showed some degenerative changes but no fractures  Take prednisone 20 mg--2 daily for 5 days  Hydrocodone 5 mg--1 tablet every 6 hours as needed for pain.  This is best taken with food.  It can cause sleepiness or dizziness You have been given 1 dose of the hydrocodone tonight.

## 2023-05-05 ENCOUNTER — Other Ambulatory Visit: Payer: Self-pay | Admitting: Psychiatry

## 2023-05-05 DIAGNOSIS — F4010 Social phobia, unspecified: Secondary | ICD-10-CM

## 2023-05-05 DIAGNOSIS — F411 Generalized anxiety disorder: Secondary | ICD-10-CM

## 2023-05-05 DIAGNOSIS — F331 Major depressive disorder, recurrent, moderate: Secondary | ICD-10-CM

## 2023-05-26 IMAGING — MR MR MRA NECK W/O CM
5 series · 38 of 48 positions shown · non-contrast
Comparison: Brain MRI 08/17/2020

CLINICAL DATA: Word finding difficulty



[Series 10: tof_fl3d_tra_iso · axial · 0.6mm · 0.52mm/px · z∈[-211,-132]mm · 11 of 133 slices shown (1 of 2)]
[im 1/133]
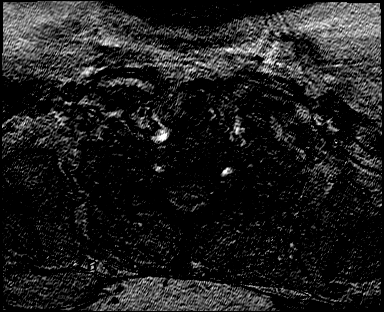
[im 14/133]
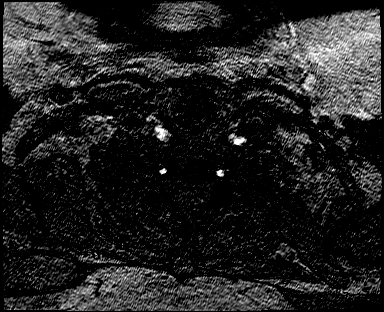
[im 27/133]
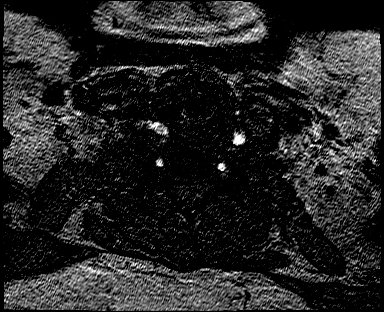
[im 40/133]
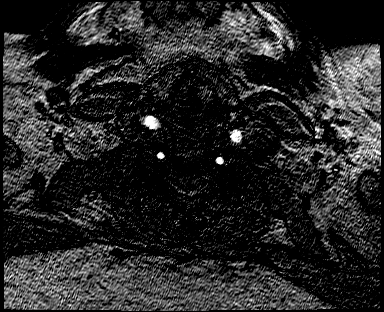
[im 53/133]
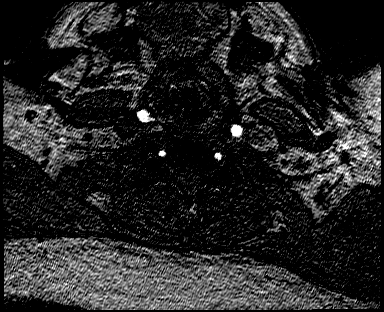
[im 67/133]
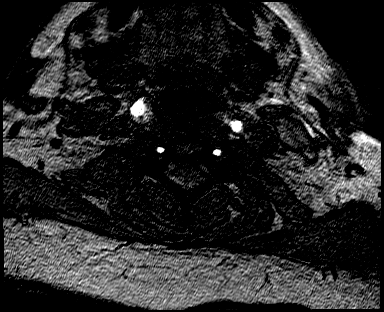
[im 80/133]
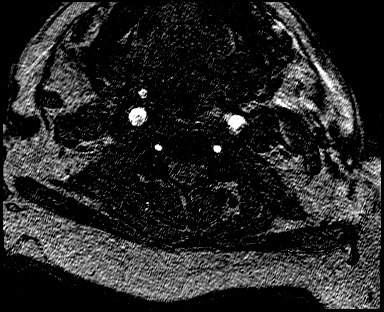
[im 93/133]
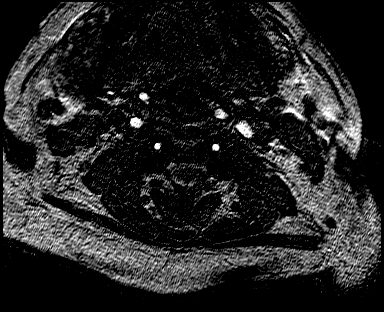
[im 106/133]
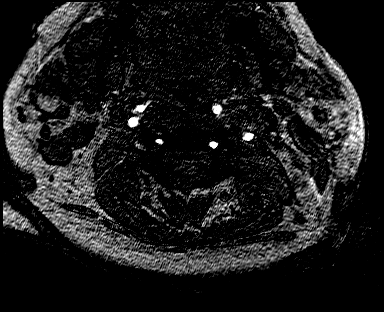
[im 119/133]
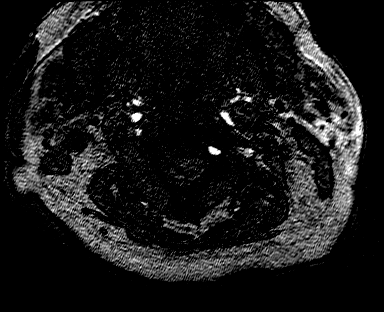
[im 133/133]
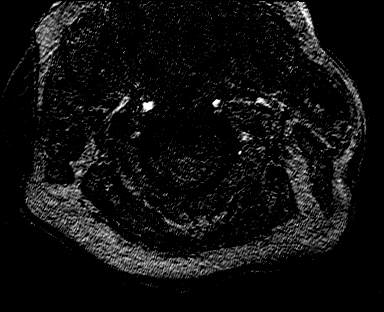

[Series 13: tof_fl3d_tra_iso · axial · 0.6mm · 0.52mm/px · z∈[-281,-98]mm · 17 of 316 slices shown (2 of 2)]
[im 1/316]
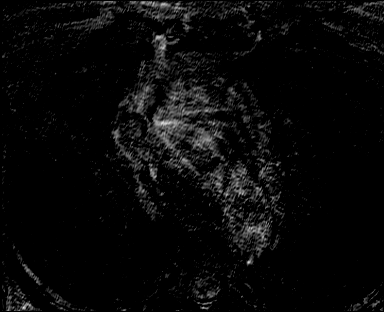
[im 13/316]
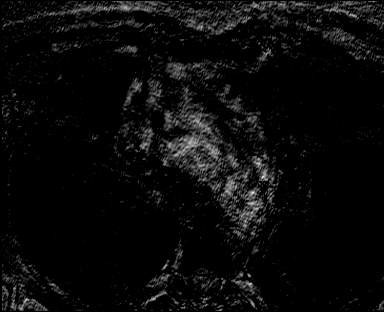
[im 25/316]
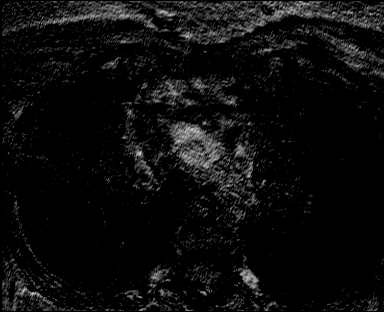
[im 37/316]
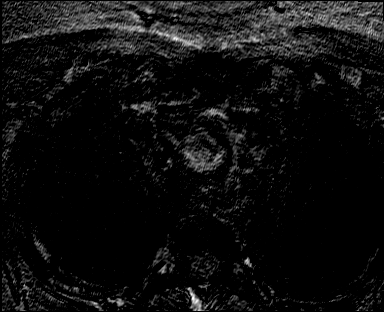
[im 49/316]
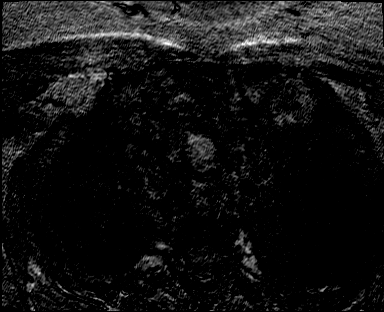
[im 61/316]
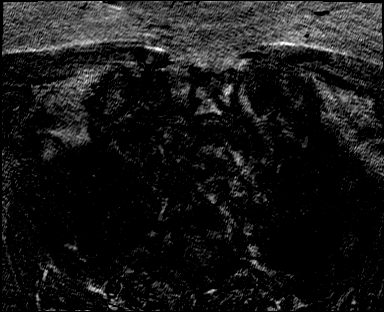
[im 73/316]
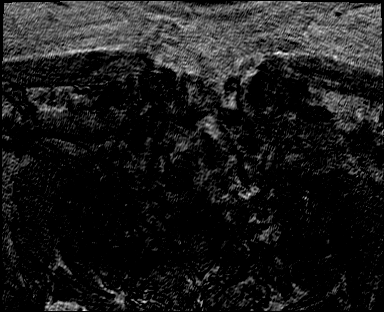
[im 85/316]
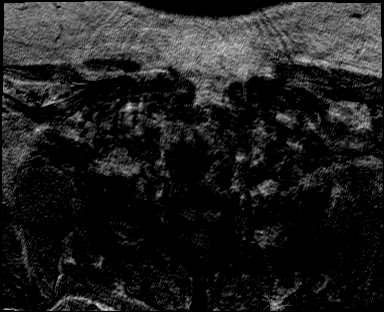
[im 97/316]
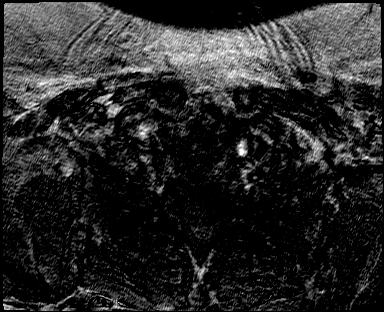
[im 110/316]
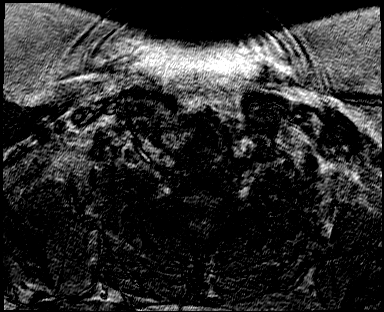
[im 134/316]
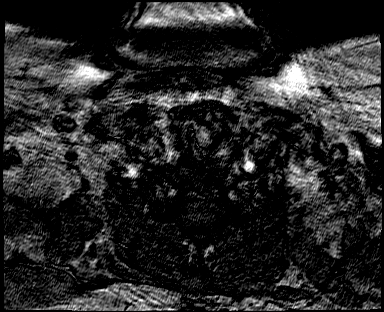
[im 158/316]
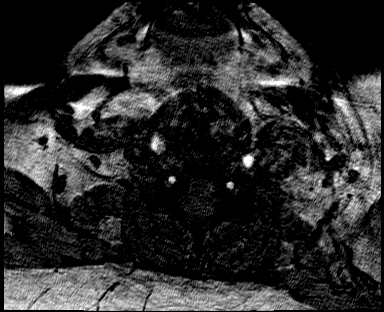
[im 182/316]
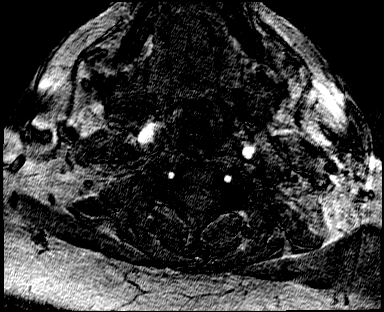
[im 219/316]
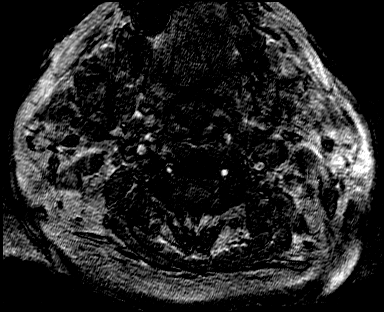
[im 255/316]
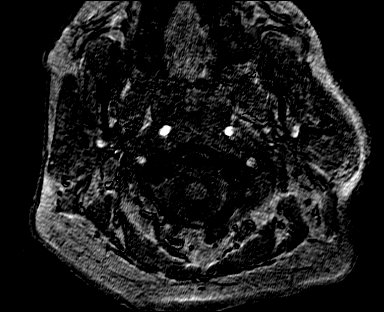
[im 267/316]
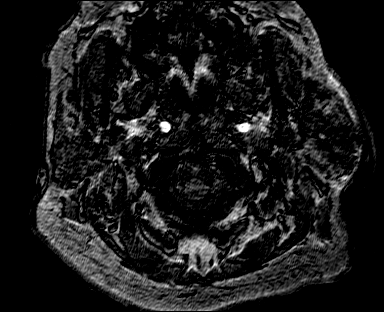
[im 303/316]
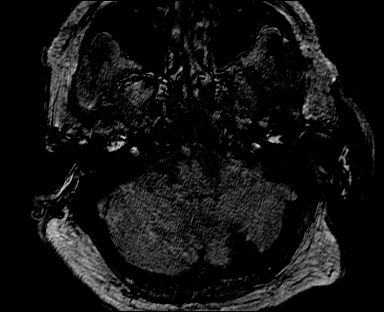

[Series 17: tof_fl2d_tra · axial · 3.0mm · 0.78mm/px · z∈[-286,-100]mm · 8 of 90 slices shown]
[im 1/90]
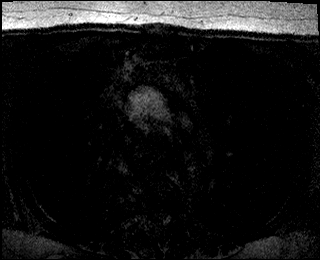
[im 13/90]
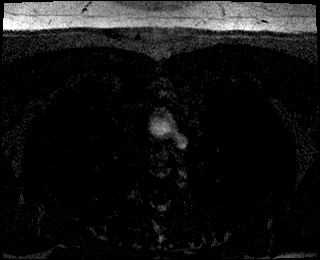
[im 26/90]
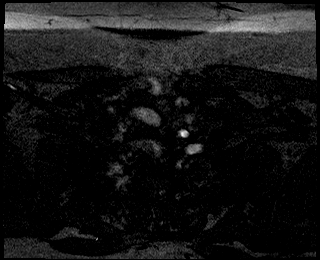
[im 39/90]
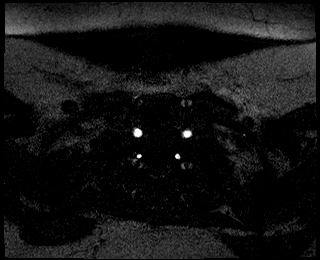
[im 51/90]
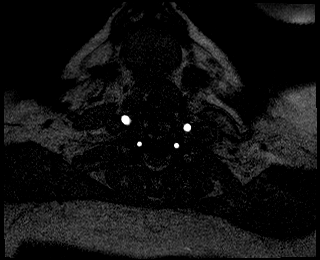
[im 64/90]
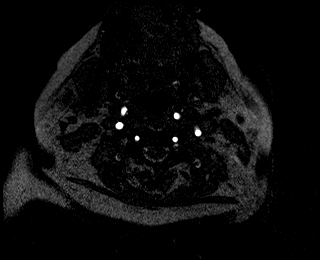
[im 77/90]
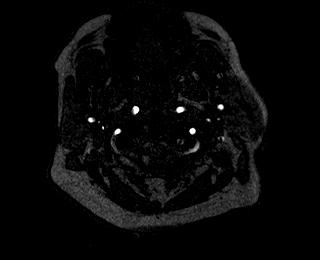
[im 90/90]
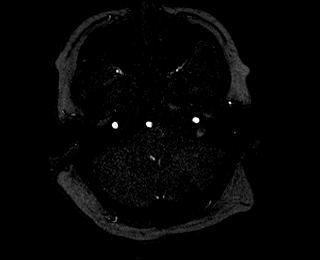

[Series 20: tof_fl2d_tra_mip_tra · axial · 189.9mm · 0.78mm/px · 1 of 1 slices shown]
[im 1/1]
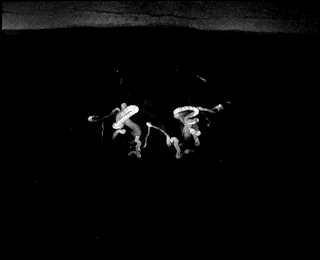

[Series 21: tof_fl2d_tra_mip_radials · axial · 0.78mm/px · 1 of 7 slices shown]
[im 1/7]
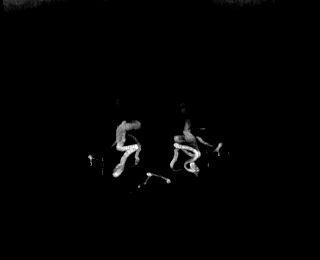

[38 of 48 positions shown; findings below may reference images not displayed]

FINDINGS: MRI HEAD FINDINGS

Brain: There is no evidence of acute intracranial hemorrhage,
extra-axial fluid collection, or acute infarct.

Background parenchymal volume is normal. The ventricles are normal
in size. There is a remote infarct in the left cerebellar
hemisphere, unchanged since 7577. Scattered small foci of FLAIR
signal abnormality in the subcortical and periventricular white
matter are nonspecific but likely reflects sequela of mild chronic
white matter microangiopathy, unchanged.

There is no suspicious parenchymal signal abnormality. There is no
mass lesion. There is no mass effect or midline shift.

Vascular: The major intracranial flow voids are normal. The
vasculature is assessed in full below.

Skull and upper cervical spine: Normal marrow signal.

Sinuses/Orbits: There is mild mucosal thickening in the paranasal
sinuses. The globes and orbits are unremarkable.

Other: None.

MRA HEAD FINDINGS

Anterior circulation: The intracranial ICAs are patent

The bilateral MCAs are patent.

The bilateral ACAs are patent. The anterior communicating artery is
normal.

There is no aneurysm or AVM.

Posterior circulation: The bilateral V4 segments are patent. There
is a fenestration in the right V4 segment. PICA is identified
bilaterally. The basilar artery is patent.

The bilateral PCAs are patent. The right posterior communicating
artery is identified. The left posterior communicating artery is not
definitely seen.

There is no aneurysm or AVM.

Anatomic variants: None.

MRA NECK FINDINGS

Aortic arch: The imaged aortic arch is unremarkable. The origins of
the major branch vessels appear patent. There is a common origin of
the brachiocephalic and left common carotid arteries, a normal
variant.

Right carotid system: Right common, internal, and external carotid
arteries are patent, without evidence of hemodynamically significant
stenosis or occlusion. There is no evidence of dissection or
aneurysm.

Left carotid system: The left common, internal, and external carotid
arteries are patent, without evidence of hemodynamically significant
stenosis or occlusion. There is no evidence of dissection or
aneurysm.

Vertebral arteries: The vertebral artery origins are not well
assessed. Otherwise, the vertebral arteries are patent with
antegrade flow. There is no evidence of hemodynamically significant
stenosis or occlusion. There is no evidence of dissection or
aneurysm.

Other: None
IMPRESSION: 1. No acute intracranial pathology.
2. Unchanged remote infarct in the left cerebellar hemisphere.
Scattered small foci of FLAIR signal abnormality in the subcortical
and periventricular white matter are nonspecific but most likely
reflects sequela of mild chronic white matter microangiopathy.
3. Patent vasculature of the head and neck with no significant
stenosis or occlusion.

## 2023-07-01 ENCOUNTER — Other Ambulatory Visit (HOSPITAL_COMMUNITY): Payer: Self-pay | Admitting: Medical

## 2023-07-01 ENCOUNTER — Ambulatory Visit (HOSPITAL_COMMUNITY)
Admission: RE | Admit: 2023-07-01 | Discharge: 2023-07-01 | Disposition: A | Discharge status: Home / Self Care | Payer: Self-pay | Source: Ambulatory Visit | Attending: Cardiology | Admitting: Cardiology

## 2023-07-01 DIAGNOSIS — M25562 Pain in left knee: Secondary | ICD-10-CM

## 2023-07-21 ENCOUNTER — Ambulatory Visit (HOSPITAL_COMMUNITY)
Admission: EM | Admit: 2023-07-21 | Discharge: 2023-07-21 | Disposition: A | Discharge status: Home / Self Care | Payer: Medicaid Other | Attending: Internal Medicine | Admitting: Internal Medicine

## 2023-07-21 ENCOUNTER — Encounter (HOSPITAL_COMMUNITY): Payer: Self-pay | Admitting: Emergency Medicine

## 2023-07-21 DIAGNOSIS — L03221 Cellulitis of neck: Secondary | ICD-10-CM

## 2023-07-21 DIAGNOSIS — L0211 Cutaneous abscess of neck: Secondary | ICD-10-CM

## 2023-07-21 MED ORDER — DOXYCYCLINE HYCLATE 100 MG PO CAPS: 100.0000 mg | ORAL_CAPSULE | Freq: Two times a day (BID) | ORAL | 0 refills | Status: AC

## 2023-07-21 MED ORDER — IBUPROFEN 800 MG PO TABS: 800.0000 mg | ORAL_TABLET | Freq: Three times a day (TID) | ORAL | 0 refills | Status: DC | PRN

## 2023-07-21 NOTE — Discharge Instructions (Signed)
Please take antibiotics as prescribed Please apply warm compresses to the area involved Please take ibuprofen as needed for pain There is no drainable abscess at this time. If you have worsening pain, swelling, increasing drainage or fluctuance please return to the urgent care to be reevaluated.

## 2023-07-21 NOTE — ED Triage Notes (Signed)
Pt reports abscess on posterior neck that is painful for a couple days. Denies drainage. Reports tried heat, ice, potato skins.

## 2023-07-25 NOTE — ED Provider Notes (Signed)
[facility]    CSN: [id number] Arrival date & time: 07/21/23  4098      History   Chief Complaint Chief Complaint  Patient presents with   Abscess    HPI Kristina Savage is a 62 y.o. female comes to the urgent care with 2-day history of painful swelling on the posterior aspect of the neck.  Patient says symptoms started 2 days ago and has been persistent.  Swelling is red, throbbing and aggravated by touching.  No known relieving factors.  No fever or chills.  No discharge from the area.  Patient comes to the urgent care to be reevaluated.  She has been applying heat, ice and potato skins with no improvement in her symptoms.  No trauma to the neck.   HPI  Past Medical History:  Diagnosis Date   Anxiety    B12 deficiency    Chicken pox    Depression    Diverticulitis    Dizziness    Genital warts    GERD (gastroesophageal reflux disease)    Hay fever    Hyperlipidemia    Ovarian cyst    Parkinson's disease (HCC)    Dr.Shaw [place] and at [facility]  Functional/neurological condition.   Poor balance    Pre-diabetes    Sleep apnea    Stroke [facility])    UTI (lower urinary tract infection)    Vitamin D deficiency     Patient Active Problem List   Diagnosis Date Noted   Contact with and (suspected) exposure to covid-19 04/06/2023   Effusion of left knee joint 02/17/2023   Seborrheic keratoses 01/13/2023   Class 2 severe obesity due to excess calories with serious comorbidity and body mass index (BMI) of 38.0 to 38.9 in adult (HCC) 08/30/2022   B12 deficiency 06/30/2022   Parkinson's disease (HCC) 06/30/2022   Tobacco abuse 06/30/2022   Vitamin D deficiency 02/02/2022   Mood disorder (HCC)- emotional eating 02/02/2022   MDD (major depressive disorder), recurrent episode, moderate (HCC) 09/23/2021   GAD (generalized anxiety disorder) 09/23/2021   Social anxiety disorder 09/23/2021   Primary parkinsonism (HCC) 01/02/2021   Cerebral vascular disease 08/21/2020    Mixed hyperlipidemia 08/21/2020   Balance problem 08/21/2020   Benign paroxysmal positional vertigo 08/21/2020   Postural dizziness 08/21/2020   Tobacco use disorder 06/02/2020   Functional tremor 05/15/2020   Memory change 05/15/2020   Prediabetes 04/24/2020   Diverticulitis of colon 08/13/2014   Ovarian cyst 08/13/2014    Past Surgical History:  Procedure Laterality Date   CERVIX REMOVAL     Laser surgery, not complete removal   CESAREAN SECTION     CHONDROPLASTY Right 06/11/2020   Procedure: CHONDROPLASTY;  Surgeon: Beverely Low, MD;  Location: [facility];  Service: Orthopedics;  Laterality: Right;   KNEE ARTHROSCOPY WITH MEDIAL MENISECTOMY Right 06/11/2020   Procedure: KNEE ARTHROSCOPY WITH MEDIAL MENISECTOMY;  Surgeon: Beverely Low, MD;  Location: [facility];  Service: Orthopedics;  Laterality: Right;    OB History     Gravida  4   Para  4   Term  4   Preterm      AB      Living  4      SAB      IAB      Ectopic      Multiple      Live Births  4            Home Medications  Prior to Admission medications   Medication Sig Start Date End Date Taking? Authorizing Provider  doxycycline (VIBRAMYCIN) 100 MG capsule Take 1 capsule (100 mg total) by mouth 2 (two) times daily for 7 days. 07/21/23 07/28/23 Yes Antwaun Buth, Britta Mccreedy, MD  ibuprofen (ADVIL) 800 MG tablet Take 1 tablet (800 mg total) by mouth every 8 (eight) hours as needed. 07/21/23  Yes LampteyBritta Mccreedy, MD  aspirin EC 81 MG tablet Take 81 mg by mouth daily. Swallow whole.    [provider]  atorvastatin (LIPITOR) 10 MG tablet Take 1 tablet (10 mg total) by mouth daily. 10/19/22   Mort Sawyers, FNP  carbidopa-levodopa (SINEMET IR) 25-100 MG tablet Take 2 tablets by mouth 3 (three) times daily. 10/13/21 12/30/22  [provider]  clonazePAM (KLONOPIN) 0.5 MG tablet Take 0.5-1 tablets (0.25-0.5 mg total) by mouth as directed. Take half to 1 tablet  2-3 times a week for severe anxiety, agitation 12/30/22   Jomarie Longs, MD  cyanocobalamin (CYANOCOBALAMIN) 500 MCG tablet 200-552mcg QD 02/16/22   Opalski, Deborah, DO  DULoxetine (CYMBALTA) 30 MG capsule TAKE 1 CAPSULE BY MOUTH DAILY IN THE MORNING AND 2 CAPSULES DAILY AT BEDTIME AS DIRECTED 05/06/23   Jomarie Longs, MD  HYDROcodone-acetaminophen (NORCO/VICODIN) 5-325 MG tablet Take 1 tablet by mouth every 6 (six) hours as needed (pain). 04/22/23   Zenia Resides, MD  ketoconazole (NIZORAL) 2 % cream APPLY TOPICALLY TWICE A DAY AS NEEDED FOR IRRITATION 03/23/23   Mort Sawyers, FNP  Multiple Vitamin (MULTIVITAMIN) tablet Take 1 tablet by mouth daily.    [provider]  propranolol (INDERAL) 10 MG tablet Take 1 tablet (10 mg total) by mouth 3 (three) times daily as needed. For severe anxiety 12/30/22   Jomarie Longs, MD  PROVENTIL HFA 108 (90 BASE) MCG/ACT inhaler Inhale 2 puffs into the lungs daily as needed for shortness of breath.  05/17/14 05/01/19  [provider]    Family History Family History  Problem Relation Age of Onset   Arthritis Mother    Hypertension Mother    COPD Mother    Heart failure Mother    Heart disease Mother    Depression Mother    Eating disorder Mother    Obesity Mother    Arthritis Father    Thyroid disease Father        thinks cancer   Parkinson's disease Father    Alcohol abuse Sister    Breast cancer Maternal Grandmother    Diabetes Paternal Grandmother    Parkinson's disease Paternal Grandmother    Diabetes Son    Cancer Maternal Aunt        Female cancer - not sure what   Breast cancer Maternal Aunt    Prostate cancer Maternal Uncle    Colon cancer Maternal Uncle 55    Social History Social History   Tobacco Use   Smoking status: Every Day    Current packs/day: 0.25    Average packs/day: 0.3 packs/day for 30.0 years (7.5 ttl pk-yrs)    Types: Cigarettes    Passive exposure: Past   Smokeless tobacco: Never   Substance Use Topics   Alcohol use: Not Currently    Alcohol/week: 0.0 standard drinks of alcohol    Comment: once a year   Drug use: No     Allergies   Oxycodone-acetaminophen, Percocet [oxycodone-acetaminophen], Pollen extract, and Prozac [fluoxetine]   Review of Systems Review of Systems As per HPI  Physical Exam Triage Vital Signs ED  Triage Vitals  Encounter Vitals Group     BP 07/21/23 1945 (!) 132/91     Systolic BP Percentile --      Diastolic BP Percentile --      Pulse Rate 07/21/23 1945 83     Resp 07/21/23 1945 18     Temp 07/21/23 1945 98 F (36.7 C)     Temp Source 07/21/23 1945 Oral     SpO2 07/21/23 1945 98 %     Weight --      Height --      Head Circumference --      Peak Flow --      Pain Score 07/21/23 1944 8     Pain Loc --      Pain Education --      Exclude from Growth Chart --    No data found.  Updated Vital Signs BP (!) 132/91 (BP Location: Right Arm)   Pulse 83   Temp 98 F (36.7 C) (Oral)   Resp 18   SpO2 98%   Visual Acuity Right Eye Distance:   Left Eye Distance:   Bilateral Distance:    Right Eye Near:   Left Eye Near:    Bilateral Near:     Physical Exam Vitals and nursing note reviewed.  Constitutional:      General: She is in acute distress.  Cardiovascular:     Rate and Rhythm: Normal rate and regular rhythm.     Pulses: Normal pulses.     Heart sounds: Normal heart sounds.  Pulmonary:     Effort: Pulmonary effort is normal.     Breath sounds: Normal breath sounds.  Musculoskeletal:        General: Normal range of motion.     Cervical back: Normal range of motion and neck supple.  Skin:    Comments: Tender erythematous area on the posterior aspect of the neck.  Erythematous area measures about 2 inches in the longest diameter.  It is indurated.  No fluctuance.  No drainable abscess.  There is a scab which has been the removed.  The area involved is on the hairline in the posterior aspect of the neck.   Neurological:     Mental Status: She is alert.      UC Treatments / Results  Labs (all labs ordered are listed, but only abnormal results are displayed) Labs Reviewed - No data to display  EKG   Radiology No results found.  Procedures Procedures (including critical care time)  Medications Ordered in UC Medications - No data to display  Initial Impression / Assessment and Plan / UC Course  I have reviewed the triage vital signs and the nursing notes.  Pertinent labs & imaging results that were available during my care of the patient were reviewed by me and considered in my medical decision making (see chart for details).     1.  Cellulitis of the posterior aspect of the neck: No drainable abscess Doxycycline 100 mg twice daily for 7 days Ibuprofen as needed for pain Patient is advised to continue using warm compresses Return precautions given. Final Clinical Impressions(s) / UC Diagnoses   Final diagnoses:  Cellulitis and abscess of neck     Discharge Instructions      Please take antibiotics as prescribed Please apply warm compresses to the area involved Please take ibuprofen as needed for pain There is no drainable abscess at this time. If you have worsening pain, swelling, increasing drainage or  fluctuance please return to the urgent care to be reevaluated.   ED Prescriptions     Medication Sig Dispense Auth. Provider   doxycycline (VIBRAMYCIN) 100 MG capsule Take 1 capsule (100 mg total) by mouth 2 (two) times daily for 7 days. 14 capsule Kieryn Burtis, Britta Mccreedy, MD   ibuprofen (ADVIL) 800 MG tablet Take 1 tablet (800 mg total) by mouth every 8 (eight) hours as needed. 21 tablet Scot Shiraishi, Britta Mccreedy, MD      PDMP not reviewed this encounter.   Merrilee Jansky, MD 07/25/23 1344

## 2023-08-03 ENCOUNTER — Telehealth: Payer: Self-pay

## 2023-08-03 NOTE — Telephone Encounter (Signed)
 Spoke with Kristina Savage and she has been scheduled to see Tabitha tomorrow at 0900 to be evaluated.

## 2023-08-03 NOTE — Telephone Encounter (Signed)
 Copied from CRM [id number]. Topic: Clinical - Medication Question >> Aug 03, 2023  9:04 AM Robinson H wrote: Reason for CRM: Patient states she went to [place] urgent care 12/26 for spot on back of neck cellulitis finished antibiotics but still there and wants to know if she can have more antibiotics Doxycycline  100mg  is what patient was taking  . Please reach out to patient  Kristina Savage [phone]

## 2023-08-04 ENCOUNTER — Encounter: Payer: Self-pay | Admitting: General Surgery

## 2023-08-04 ENCOUNTER — Ambulatory Visit: Payer: No Typology Code available for payment source | Admitting: Family

## 2023-08-04 ENCOUNTER — Encounter: Payer: Self-pay | Admitting: Family

## 2023-08-04 ENCOUNTER — Ambulatory Visit (INDEPENDENT_AMBULATORY_CARE_PROVIDER_SITE_OTHER): Payer: No Typology Code available for payment source | Admitting: General Surgery

## 2023-08-04 VITALS — BP 147/86 | HR 80 | Temp 98.1°F | Ht 64.0 in | Wt 243.8 lb

## 2023-08-04 VITALS — BP 122/86 | HR 72 | Temp 98.3°F | Ht 64.0 in | Wt 245.8 lb

## 2023-08-04 DIAGNOSIS — M25562 Pain in left knee: Secondary | ICD-10-CM | POA: Insufficient documentation

## 2023-08-04 DIAGNOSIS — R29898 Other symptoms and signs involving the musculoskeletal system: Secondary | ICD-10-CM | POA: Diagnosis not present

## 2023-08-04 DIAGNOSIS — R221 Localized swelling, mass and lump, neck: Secondary | ICD-10-CM

## 2023-08-04 DIAGNOSIS — M7122 Synovial cyst of popliteal space [Baker], left knee: Secondary | ICD-10-CM

## 2023-08-04 DIAGNOSIS — J00 Acute nasopharyngitis [common cold]: Secondary | ICD-10-CM | POA: Diagnosis not present

## 2023-08-04 DIAGNOSIS — L0211 Cutaneous abscess of neck: Secondary | ICD-10-CM | POA: Diagnosis not present

## 2023-08-04 MED ORDER — SULFAMETHOXAZOLE-TRIMETHOPRIM 800-160 MG PO TABS: 1.0000 | ORAL_TABLET | Freq: Two times a day (BID) | ORAL | 0 refills | Status: AC

## 2023-08-04 NOTE — Assessment & Plan Note (Signed)
 Same, still symptomatic.  Completed doxycycline , will start bactrim  800/160 mg po bid x 7 days.  Pt advised to:  Please monitor site for worsening signs/symptoms of infection to include: increasing redness, increasing tenderness, increase in size, and or pustulant drainage from site. If this is to occur please let me know immediately.   Referral placed for general surgeon, likely I&D needed Advised warm compresses often throughout the day.  Tylenol  prn pain

## 2023-08-04 NOTE — Assessment & Plan Note (Signed)
 Acute on chronic, not improving.  With weakness, could consider physical therapy however for now suspect arthritis and bakers cyst worsening gait and increasing risk for falls so will refer to orthopedist for possible drainage, evaluation, and if necessary MRI to examine ligaments.  Use cane with walking, reduce fall risks in home, tylenol  for pain prn. Can use voltaren gel on knee in light topical dosing. Compression sleeve for knee while walking.

## 2023-08-04 NOTE — Patient Instructions (Signed)
Epidermoid Cyst  An epidermoid cyst, also called an epidermal cyst, is a small lump under your skin. The cyst contains a substance called keratin. Do not try to pop or open the cyst yourself. What are the causes? A blocked hair follicle. A hair that curls and re-enters the skin instead of growing straight out of the skin. A blocked pore. Irritated skin. An injury to the skin. Certain conditions that are passed along from parent to child. Human papillomavirus (HPV). This happens rarely when cysts occur on the bottom of the feet. Long-term sun damage to the skin. What increases the risk? Having acne. Being female. Having an injury to the skin. Being past puberty. Having certain conditions caused by genes (genetic disorder) What are the signs or symptoms? These cysts are usually harmless, but they can get infected. Symptoms of infection may include: Redness. Inflammation. Tenderness. Warmth. Fever. A bad-smelling substance that drains from the cyst. Pus that drains from the cyst. How is this treated? In many cases, epidermoid cysts go away on their own without treatment. If a cyst becomes infected, treatment may include: Opening and draining the cyst, done by a doctor. After draining, you may need minor surgery to remove the rest of the cyst. Antibiotic medicine. Shots of medicines (steroids) that help to reduce inflammation. Surgery to remove the cyst. Surgery may be done if the cyst: Becomes large. Bothers you. Has a chance of turning into cancer. Do not try to open a cyst yourself. Follow these instructions at home: Medicines Take over-the-counter and prescription medicines as told by your doctor. If you were prescribed an antibiotic medicine, take it as told by your doctor. Do not stop taking it even if you start to feel better. General instructions Keep the area around your cyst clean and dry. Wear loose, dry clothing. Avoid touching your cyst. Check your cyst every day  for signs of infection. Check for: Redness, swelling, or pain. Fluid or blood. Warmth. Pus or a bad smell. Keep all follow-up visits. How is this prevented? Wear clean, dry, clothing. Avoid wearing tight clothing. Keep your skin clean and dry. Take showers or baths every day. Contact a doctor if: Your cyst has symptoms of infection. Your condition does not improve or gets worse. You have a cyst that looks different from other cysts you have had. You have a fever. Get help right away if: Redness spreads from the cyst into the area close by. Summary An epidermoid cyst is a small lump under your skin. If a cyst becomes infected, treatment may include surgery to open and drain the cyst, or to remove it. Take over-the-counter and prescription medicines only as told by your doctor. Contact a doctor if your condition is not improving or is getting worse. Keep all follow-up visits. This information is not intended to replace advice given to you by your health care provider. Make sure you discuss any questions you have with your health care provider. Document Revised: 10/16/2019 Document Reviewed: 10/17/2019 Elsevier Patient Education  2024 [employer].

## 2023-08-04 NOTE — Progress Notes (Signed)
 Established Patient Office Visit  Subjective:   Patient ID: Kristina Savage, female    DOB: 1961/03/20  Age: 63 y.o. MRN: [id number]  CC:  Chief Complaint  Patient presents with   Acute Visit    Has a knot on the back of her neck that is not improving and feels like it's not getting better. Was seen in urgent care for this on 07/21/23.    HPI: Kristina Savage is a 63 y.o. female presenting on 08/04/2023 for Acute Visit (Has a knot on the back of her neck that is not improving and feels like it's not getting better. Was seen in urgent care for this on 07/21/23.)  Went to [facility] urgent care 12/26 for 2 day h/o painful swelling posterior neck. Ws started on doxycycline  100 mg po bid x 7 days, ibuprofen  prn. Dx with cellulitis neck.   She is here today stating that doxycycline  did help but still palpable. Nontender. She feels knots in her posterior neck and 'hard spots'   C/o sinus pressure and productive cough. No sore throat or ear pain.   Left knee bakers cyst: she states that she was seen with [facility] and they said they were hesitant to treat this due to it 'being on the main artery' she went to get an u/s to rule out DVT. U/s was fine but she had a 'bakers cyst' on her knee. Often with swelling right below the patella. She has had mutiple fall injuries with trauma to left lower extremity. She does note left lower extremity weakness. She is now walking with a cane and falls have decreased. If she walks on leg for 'any length of time' has pain. She does note she had an xray done in the fall but we do not have the report. She would like a referral to an orthopedist.             ROS: Negative unless specifically indicated above in HPI.   Relevant past medical history reviewed and updated as indicated.   Allergies and medications reviewed and updated.   Current Outpatient Medications:    aspirin  EC 81 MG tablet, Take 81 mg by mouth daily. Swallow whole., Disp: , Rfl:     atorvastatin  (LIPITOR) 10 MG tablet, Take 1 tablet (10 mg total) by mouth daily., Disp: 90 tablet, Rfl: 3   clonazePAM  (KLONOPIN ) 0.5 MG tablet, Take 0.5-1 tablets (0.25-0.5 mg total) by mouth as directed. Take half to 1 tablet 2-3 times a week for severe anxiety, agitation, Disp: 15 tablet, Rfl: 1   DULoxetine  (CYMBALTA ) 30 MG capsule, TAKE 1 CAPSULE BY MOUTH DAILY IN THE MORNING AND 2 CAPSULES DAILY AT BEDTIME AS DIRECTED, Disp: 270 capsule, Rfl: 0   sulfamethoxazole -trimethoprim  (BACTRIM  DS) 800-160 MG tablet, Take 1 tablet by mouth 2 (two) times daily for 7 days., Disp: 14 tablet, Rfl: 0  Allergies  Allergen Reactions   Oxycodone -Acetaminophen  Nausea And Vomiting   Percocet [Oxycodone -Acetaminophen ] Nausea And Vomiting   Pollen Extract     Nasal congestion   Prozac  [Fluoxetine ] Nausea Only    Objective:   BP 122/86 (BP Location: Left Arm, Patient Position: Sitting, Cuff Size: Large)   Pulse 72   Temp 98.3 F (36.8 C) (Temporal)   Ht 5' 4 (1.626 m)   Wt 245 lb 12.8 oz (111.5 kg)   SpO2 98%   BMI 42.19 kg/m    Physical Exam Constitutional:      General: She is not in acute distress.  Appearance: Normal appearance. She is normal weight. She is not ill-appearing, toxic-appearing or diaphoretic.  HENT:     Head: Normocephalic.     Right Ear: Tympanic membrane normal.     Left Ear: Tympanic membrane normal.     Nose: Nose normal.     Mouth/Throat:     Mouth: Mucous membranes are dry.     Pharynx: No oropharyngeal exudate or posterior oropharyngeal erythema.  Eyes:     Extraocular Movements: Extraocular movements intact.     Pupils: Pupils are equal, round, and reactive to light.  Cardiovascular:     Rate and Rhythm: Normal rate and regular rhythm.     Pulses: Normal pulses.     Heart sounds: Normal heart sounds.  Pulmonary:     Effort: Pulmonary effort is normal.     Breath sounds: Normal breath sounds.  Musculoskeletal:     Cervical back: Normal range of motion.      Left knee: Swelling and effusion present. No erythema. Decreased range of motion (due to pain).  Skin:    Findings: Abscess (posterior neck with erythema induration approx 2.5 cm with slight tenderness on palpation, scab but not head, no drainage) present.  Neurological:     General: No focal deficit present.     Mental Status: She is alert and oriented to person, place, and time. Mental status is at baseline.  Psychiatric:        Mood and Affect: Mood normal.        Behavior: Behavior normal.        Thought Content: Thought content normal.        Judgment: Judgment normal.      Assessment & Plan:  Abscess, neck Assessment & Plan: Same, still symptomatic.  Completed doxycycline , will start bactrim  800/160 mg po bid x 7 days.  Pt advised to:  Please monitor site for worsening signs/symptoms of infection to include: increasing redness, increasing tenderness, increase in size, and or pustulant drainage from site. If this is to occur please let me know immediately.   Referral placed for general surgeon, likely I&D needed Advised warm compresses often throughout the day.  Tylenol  prn pain  Orders: -     Ambulatory referral to General Surgery -     Sulfamethoxazole -Trimethoprim ; Take 1 tablet by mouth 2 (two) times daily for 7 days.  Dispense: 14 tablet; Refill: 0  Acute pain of left knee Assessment & Plan: Acute on chronic, not improving.  With weakness, could consider physical therapy however for now suspect arthritis and bakers cyst worsening gait and increasing risk for falls so will refer to orthopedist for possible drainage, evaluation, and if necessary MRI to examine ligaments.  Use cane with walking, reduce fall risks in home, tylenol  for pain prn. Can use voltaren gel on knee in light topical dosing. Compression sleeve for knee while walking.    Orders: -     Ambulatory referral to Orthopedic Surgery  Weakness of left lower extremity -     Ambulatory referral to  Orthopedic Surgery  Acute nasopharyngitis Assessment & Plan: Resolving Did complete doxycycline  while treating abscess which likely helps.  Advised patient on supportive measures:  Be sure to rest, drink plenty of fluids, and use tylenol  or ibuprofen  as needed for pain. Follow up if fever >101, if symptoms worsen or if symptoms are not improved in 3 days. Patient verbalizes understanding.     Baker's cyst of knee, left -     Ambulatory referral to Orthopedic  Surgery     Follow up plan: Return if symptoms worsen or fail to improve.  Ginger Patrick, FNP

## 2023-08-04 NOTE — Patient Instructions (Signed)
A referral was placed today for an orthopedist.  Please let us know if you have not heard back within 2 weeks about the referral.

## 2023-08-04 NOTE — Assessment & Plan Note (Signed)
 Resolving Did complete doxycycline  while treating abscess which likely helps.  Advised patient on supportive measures:  Be sure to rest, drink plenty of fluids, and use tylenol  or ibuprofen  as needed for pain. Follow up if fever >101, if symptoms worsen or if symptoms are not improved in 3 days. Patient verbalizes understanding.

## 2023-08-15 ENCOUNTER — Other Ambulatory Visit (INDEPENDENT_AMBULATORY_CARE_PROVIDER_SITE_OTHER): Payer: No Typology Code available for payment source

## 2023-08-15 ENCOUNTER — Ambulatory Visit (INDEPENDENT_AMBULATORY_CARE_PROVIDER_SITE_OTHER): Payer: No Typology Code available for payment source | Admitting: Physician Assistant

## 2023-08-15 DIAGNOSIS — M1712 Unilateral primary osteoarthritis, left knee: Secondary | ICD-10-CM | POA: Diagnosis not present

## 2023-08-15 DIAGNOSIS — M25562 Pain in left knee: Secondary | ICD-10-CM

## 2023-08-15 DIAGNOSIS — G8929 Other chronic pain: Secondary | ICD-10-CM | POA: Diagnosis not present

## 2023-08-15 MED ORDER — METHYLPREDNISOLONE ACETATE 40 MG/ML IJ SUSP
40.0000 mg | INTRAMUSCULAR | Status: AC | PRN
  Administered 2023-08-15: 40 mg via INTRA_ARTICULAR

## 2023-08-15 MED ORDER — LIDOCAINE HCL 1 % IJ SOLN
4.0000 mL | INTRAMUSCULAR | Status: AC | PRN
  Administered 2023-08-15: 4 mL

## 2023-08-15 NOTE — Progress Notes (Signed)
Patient ID: Kristina Savage, female   DOB: 22-Jun-1961, 63 y.o.   MRN: [id number] CC: Posterior Neck Abscess History of Present Illness Kristina Savage is a 63 y.o. female with .  Past medical history as below who presents in consultation for posterior neck mass.  The patient reports that on around Christmas she developed swelling and pain and a lump on her neck.  She also said that this was red.  She went to urgent care on 26 December and at that time was prescribed doxycycline and was diagnosed with cellulitis.  She says that it did help a little bit but she continues to have the mass so she presented to her primary care doctor.  Her primary care doctor prescribed antibiotics and referred her to our office.  She denies any drainage from the wound.  She says that the pain is improving and the mass seems to be getting smaller.  She also says that the redness around it has improved.  She denies any fevers or chills.  She denies any nausea or vomiting.  Past Medical History Past Medical History:  Diagnosis Date   Anxiety    B12 deficiency    Chicken pox    Depression    Diverticulitis    Dizziness    Genital warts    GERD (gastroesophageal reflux disease)    Hay fever    Hyperlipidemia    Ovarian cyst    Parkinson's disease (HCC)    Dr.Shaw [place] and at [facility]  Functional/neurological condition.   Poor balance    Pre-diabetes    Sleep apnea    Stroke [facility])    UTI (lower urinary tract infection)    Vitamin D deficiency        Past Surgical History:  Procedure Laterality Date   CERVIX REMOVAL     Laser surgery, not complete removal   CESAREAN SECTION     CHONDROPLASTY Right 06/11/2020   Procedure: CHONDROPLASTY;  Surgeon: Beverely Low, MD;  Location: [facility];  Service: Orthopedics;  Laterality: Right;   KNEE ARTHROSCOPY WITH MEDIAL MENISECTOMY Right 06/11/2020   Procedure: KNEE ARTHROSCOPY WITH MEDIAL MENISECTOMY;  Surgeon: Beverely Low, MD;  Location: [facility];  Service: Orthopedics;  Laterality: Right;    Allergies  Allergen Reactions   Oxycodone-Acetaminophen Nausea And Vomiting   Percocet [Oxycodone-Acetaminophen] Nausea And Vomiting   Pollen Extract     Nasal congestion   Prozac [Fluoxetine] Nausea Only    Current Outpatient Medications  Medication Sig Dispense Refill   aspirin EC 81 MG tablet Take 81 mg by mouth daily. Swallow whole.     atorvastatin (LIPITOR) 10 MG tablet Take 1 tablet (10 mg total) by mouth daily. 90 tablet 3   clonazePAM (KLONOPIN) 0.5 MG tablet Take 0.5-1 tablets (0.25-0.5 mg total) by mouth as directed. Take half to 1 tablet 2-3 times a week for severe anxiety, agitation 15 tablet 1   DULoxetine (CYMBALTA) 30 MG capsule TAKE 1 CAPSULE BY MOUTH DAILY IN THE MORNING AND 2 CAPSULES DAILY AT BEDTIME AS DIRECTED 270 capsule 0   No current facility-administered medications for this visit.    Family History Family History  Problem Relation Age of Onset   Arthritis Mother    Hypertension Mother    COPD Mother    Heart failure Mother    Heart disease Mother    Depression Mother    Eating disorder Mother    Obesity Mother    Arthritis Father  Thyroid disease Father        thinks cancer   Parkinson's disease Father    Alcohol abuse Sister    Breast cancer Maternal Grandmother    Diabetes Paternal Grandmother    Parkinson's disease Paternal Grandmother    Diabetes Son    Cancer Maternal Aunt        Female cancer - not sure what   Breast cancer Maternal Aunt    Prostate cancer Maternal Uncle    Colon cancer Maternal Uncle 39       Social History Social History   Tobacco Use   Smoking status: Every Day    Current packs/day: 0.25    Average packs/day: 0.3 packs/day for 30.0 years (7.5 ttl pk-yrs)    Types: Cigarettes    Passive exposure: Past   Smokeless tobacco: Never  Substance Use Topics   Alcohol use: Not Currently    Alcohol/week: 0.0 standard drinks of alcohol    Comment:  once a year   Drug use: No        ROS Full ROS of systems performed and is otherwise negative there than what is stated in the HPI  Physical Exam Blood pressure (!) 147/86, pulse 80, temperature 98.1 F (36.7 C), temperature source Oral, height 5\' 4"  (1.626 m), weight 243 lb 12.8 oz (110.6 kg), SpO2 98%.  No acute distress, alert and oriented x 3, PERRLA, moving extremities spontaneously, normal work of breathing on room air, regular rate and rhythm, on her posterior neck there is an area of erythema and underlying this there is a marble sized mass.  This is nontender to palpation and there is no areas of fluctuance.  I was unable to express any purulence from the wound.  Data Reviewed Labs reviewed and last hemoglobin A1c was 6  I have personally reviewed the patient's imaging and medical records.    Assessment    Patient has a posterior neck mass that was likely infected.  This was probably a cyst either an epidermal inclusion cyst or sebaceous cyst.  There is no fluctuance of the wound and the erythema is improving with antibiotics.  Her pain is also improving.  I discussed with her that we can remove this if this bothers her when she is not actively infected.  She should complete her course of antibiotics that she was prescribed.  If she wants the mass removed wound is not actively infected she will call our office to schedule an appointment.   Kandis Cocking 08/15/2023, 9:04 AM

## 2023-08-15 NOTE — Progress Notes (Addendum)
Office Visit Note   Patient: Kristina Savage Savage           Date of Birth: 08/11/1960           MRN: [id number] Visit Date: 08/15/2023              Requested by: Kristina Sawyers, FNP [address] [place],  [place] [address] PCP: Kristina Sawyers, FNP   Assessment & Plan: Visit Diagnoses:  1. Chronic pain of left knee   2. Primary osteoarthritis of left knee     Plan: Will see her back in just 2 weeks to see what type of response she had to the aspiration injection left knee.  Questions were encouraged and answered at length.  She is to be mindful of any mechanical symptoms which were reviewed with her.  Follow-Up Instructions: Return in about 2 weeks (around 08/29/2023).   Orders:  Orders Placed This Encounter  Procedures   Large Joint Inj   XR Knee 1-2 Views Left   No orders of the defined types were placed in this encounter.     Procedures: Large Joint Inj: L knee on 08/15/2023 12:08 PM Indications: pain Details: 22 G 1.5 in needle, anterolateral approach  Arthrogram: No  Medications: 4 mL lidocaine 1 %; 40 mg methylPREDNISolone acetate 40 MG/ML Aspirate: 17 mL yellow Outcome: tolerated well, no immediate complications Procedure, treatment alternatives, risks and benefits explained, specific risks discussed. Consent was given by the patient. Immediately prior to procedure a time out was called to verify the correct patient, procedure, equipment, support staff and site/side marked as required. Patient was prepped and draped in the usual sterile fashion.       Clinical Data: No additional findings.   Subjective: Chief Complaint  Patient presents with   Left Knee - Pain    06/11/20 RT KNEE SCOPE WITH DR. Ranell Savage    HPI Kristina Savage is a 63 year old female were seen for the first time for left knee pain.  She states she has had some left knee pain for over the past few months.  Primary care physician aspirated the knee and gave her cortisone injection back in October  states that this helped.  Over the last 2 months she has had achiness within the knee and has had several falls due to the knee giving way.  She has a history of low right knee arthroscopy by Dr. Devonne Savage 06/11/2020 for meniscal tear she also had arthritic changes involving the patellofemoral joint and medial compartment per his note.  She at this point has no pain in the right knee.  She does take Tylenol for the left knee pain with no real relief.  She also notes that she is being worked up for possible Parkinson's disease.    Review of Systems Negative for fevers chills.  Objective: Vital Signs: There were no vitals taken for this visit.  Physical Exam Constitutional:      Appearance: She is not ill-appearing or diaphoretic.  Pulmonary:     Effort: Pulmonary effort is normal.  Neurological:     Mental Status: She is alert and oriented to person, place, and time.  Psychiatric:        Mood and Affect: Mood normal.     Ortho Exam Bilateral knees good range of motion of both knees.  Tenderness at the left knee lateral aspect of the patella.  Significant patellofemoral crepitus with range of motion left knee no instability valgus varus stressing of either knee.  Nontender throughout the right knee with palpation.  McMurray's on the left is negative.  No abnormal warmth erythema of either knee.  Positive effusion left knee. Specialty Comments:  No specialty comments available.  Imaging: XR Knee 1-2 Views Left Result Date: 08/15/2023 Left knee 2 views: Knee is well located.  No acute fractures acute findings.  Moderate narrowing medial joint line mild patellofemoral changes.  Lateral compartment overall well-preserved.  No bony abnormalities.    PMFS History: Patient Active Problem List   Diagnosis Date Noted   Contact with and (suspected) exposure to covid-19 04/06/2023   Effusion of left knee joint 02/17/2023   Seborrheic keratoses 01/13/2023   Class 2 severe obesity due to excess  calories with serious comorbidity and body mass index (BMI) of 38.0 to 38.9 in adult (HCC) 08/30/2022   B12 deficiency 06/30/2022   Parkinson's disease (HCC) 06/30/2022   Tobacco abuse 06/30/2022   Vitamin D deficiency 02/02/2022   Mood disorder (HCC)- emotional eating 02/02/2022   MDD (major depressive disorder), recurrent episode, moderate (HCC) 09/23/2021   GAD (generalized anxiety disorder) 09/23/2021   Social anxiety disorder 09/23/2021   Primary parkinsonism (HCC) 01/02/2021   Cerebral vascular disease 08/21/2020   Mixed hyperlipidemia 08/21/2020   Balance problem 08/21/2020   Benign paroxysmal positional vertigo 08/21/2020   Postural dizziness 08/21/2020   Tobacco use disorder 06/02/2020   Functional tremor 05/15/2020   Memory change 05/15/2020   Prediabetes 04/24/2020   Diverticulitis of colon 08/13/2014   Ovarian cyst 08/13/2014   Past Medical History:  Diagnosis Date   Anxiety    B12 deficiency    Chicken pox    Depression    Diverticulitis    Dizziness    Genital warts    GERD (gastroesophageal reflux disease)    Hay fever    Hyperlipidemia    Ovarian cyst    Parkinson's disease (HCC)    Dr.Shaw [place] and at [facility].  Functional/neurological condition.   Poor balance    Pre-diabetes    Sleep apnea    Stroke (HCC)    UTI (lower urinary tract infection)    Vitamin D deficiency     Family History  Problem Relation Age of Onset   Arthritis Mother    Hypertension Mother    COPD Mother    Heart failure Mother    Heart disease Mother    Depression Mother    Eating disorder Mother    Obesity Mother    Arthritis Father    Thyroid disease Father        thinks cancer   Parkinson's disease Father    Alcohol abuse Sister    Breast cancer Maternal Grandmother    Diabetes Paternal Grandmother    Parkinson's disease Paternal Grandmother    Diabetes Son    Cancer Maternal Aunt        Female cancer - not sure what   Breast cancer Maternal Aunt     Prostate cancer Maternal Uncle    Colon cancer Maternal Uncle 45    Past Surgical History:  Procedure Laterality Date   CERVIX REMOVAL     Laser surgery, not complete removal   CESAREAN SECTION     CHONDROPLASTY Right 06/11/2020   Procedure: CHONDROPLASTY;  Surgeon: Beverely Low, MD;  Location: [facility];  Service: Orthopedics;  Laterality: Right;   KNEE ARTHROSCOPY WITH MEDIAL MENISECTOMY Right 06/11/2020   Procedure: KNEE ARTHROSCOPY WITH MEDIAL MENISECTOMY;  Surgeon: Beverely Low, MD;  Location:   SUR[facility]ERY C[facility];  Service: Orthopedics;  Laterality: Right;   Social History   Occupational History   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  Tobacco Use   Smoking status: Every Day    Current packs/day: 0.25    Average packs/day: 0.3 packs/day for 30.0 years (7.5 ttl pk-yrs)    Types: Cigarettes    Passive exposure: Past   Smokeless tobacco: Never  Vaping Use   Vaping status: Not on file  Substance and Sexual Activity   Alcohol use: Not Currently    Alcohol/week: 0.0 standard drinks of alcohol    Comment: once a year   Drug use: No   Sexual activity: Yes    Birth control/protection: Post-menopausal

## 2023-08-29 ENCOUNTER — Encounter: Payer: Self-pay | Admitting: Physician Assistant

## 2023-08-29 ENCOUNTER — Ambulatory Visit (INDEPENDENT_AMBULATORY_CARE_PROVIDER_SITE_OTHER): Payer: No Typology Code available for payment source | Admitting: Physician Assistant

## 2023-08-29 DIAGNOSIS — M25562 Pain in left knee: Secondary | ICD-10-CM | POA: Diagnosis not present

## 2023-08-29 NOTE — Addendum Note (Signed)
Addended by: Mardene Celeste B on: 08/29/2023 12:47 PM   Modules accepted: Orders

## 2023-08-29 NOTE — Progress Notes (Signed)
HPI: Kristina Savage returns today 2 weeks status post left knee cortisone injection.  She states that she only got some temporary relief for less than 24 hours from the knee pain.  Knee pain now is worse.  Her knee is locking at times.  She states pain is worse at night or when up ambulating.  She is now ambulating with a cane.  She feels she has fluid on the knee again.  She has had no new injury.  Review of systems: See HPI otherwise negative  Physical exam: General: Well-developed well-nourished female no acute distress. Left knee: No gross instability valgus varus stressing.  Tenderness along medial joint line.  Positive McMurray's.  Recurrent effusion.  No abnormal warmth or erythema.  Impression: Left knee pain  Plan: Given her continued pain despite conservative measures, recurrent effusion and mechanical symptoms recommend MRI left knee rule out meniscal tear.  Will obtain the MRI and then have her follow-up with [place] approximately 4 to 5 days later.  Questions were encouraged and answered at length in the interim she will use Voltaren gel on the knee.  Continue to use a cane.

## 2023-09-08 ENCOUNTER — Ambulatory Visit (HOSPITAL_COMMUNITY)
Admission: RE | Admit: 2023-09-08 | Discharge: 2023-09-08 | Disposition: A | Discharge status: Home / Self Care | Payer: No Typology Code available for payment source | Source: Ambulatory Visit | Attending: Physician Assistant | Admitting: Physician Assistant

## 2023-09-08 DIAGNOSIS — M25562 Pain in left knee: Secondary | ICD-10-CM | POA: Insufficient documentation

## 2023-09-10 ENCOUNTER — Other Ambulatory Visit: Payer: No Typology Code available for payment source

## 2023-09-29 ENCOUNTER — Ambulatory Visit: Payer: No Typology Code available for payment source | Admitting: Physician Assistant

## 2023-09-29 ENCOUNTER — Encounter: Payer: Self-pay | Admitting: Physician Assistant

## 2023-09-29 DIAGNOSIS — M1712 Unilateral primary osteoarthritis, left knee: Secondary | ICD-10-CM | POA: Diagnosis not present

## 2023-09-29 NOTE — Progress Notes (Signed)
 HPI: [name] returns today to go over the MRI of her left knee.  She continues to have pain in the left knee.  She states that the knee pain is constant.  It is keeping her awake at night.  Is greatly inhibiting her activities she has significant pain when ambulating.  She does feel like she has occasional locking in the knee.  She is taking Tylenol for the pain at cortisone injection that gave her no real relief and has been doing exercises for relief.  She has had no new injury to the knee. MRI left knee dated 09/16/2023 shows tricompartmental arthritic changes.  Mild patellofemoral compartment changes.  Medial compartment high-grade partial-thickness cartilage loss weightbearing area of the medial compartment with also mild subchondral edema involving the medial tibial plateau.  Lateral compartment with moderate partial-thickness cartilage loss weightbearing portion of the femoral-tibial compartment.  Medial aspect of the lateral tibial plateau with full-thickness cartilage loss areas.  Review of systems: Denies any fevers chills chest pain shortness of breath.  Physical exam: General Well-developed well-nourished female no acute distress.  Mood and affect appropriate. Psych: Alert and oriented x 3 Respirations: Unlabored on room air. Left knee: No instability valgus varus stressing no abnormal warmth or edema.  Slight effusion.  Tenderness along medial joint line.  Patellofemoral crepitus with passive range of motion.  Left calf supple nontender.  Ambulates without any assistive device with antalgic gait.  Impression: Left knee tricompartmental arthritis  Plan: Given patient's arthritic changes of the knee recommend left knee arthroplasty.  Risk benefits of surgery discussed.  Postop protocol for left total knee arthroplasties reviewed with the patient.  Risk include but are not limited to DVT/PE, wound healing problems, nerve vessel injury, blood loss and infection.  Patient would like to proceed  with left total knee arthroplasty in the near future.  She does not tolerate oxycodone but tolerates hydrocodone.  Questions were encouraged and answered by Dr. Magnus Ivan myself.  Knee model was shown.

## 2023-10-07 ENCOUNTER — Telehealth: Payer: Self-pay

## 2023-10-07 NOTE — Telephone Encounter (Signed)
 I called patient to discuss scheduling left TKA.  Left voice mail message for return call.

## 2023-10-31 ENCOUNTER — Telehealth: Payer: Self-pay

## 2023-10-31 NOTE — Telephone Encounter (Signed)
 Patient left message for return call to discuss scheduling surgery.  I called patient and left voice mail for return call.

## 2023-11-04 ENCOUNTER — Telehealth: Payer: Self-pay

## 2023-11-04 NOTE — Telephone Encounter (Signed)
 Patient called stating she is going to hold off on left TKA right now.  She is having swelling in her feet, with trouble walking.  She wants to get that resolved first.  She will call me back when ready to schedule.

## 2023-12-12 ENCOUNTER — Encounter: Payer: Self-pay | Admitting: *Deleted

## 2023-12-12 NOTE — Progress Notes (Deleted)
 PATIENT: Kristina Savage DOB: 06-01-1961  REASON FOR VISIT: follow up HISTORY FROM: patient  No chief complaint on file.    HISTORY OF PRESENT ILLNESS:  12/12/23 ALL:  Kristina Savage returns for follow up for OSA on CPAP.     12/09/2022 ALL:  Kristina Savage is a 63 y.o. female here today for follow up for OSA on CPAP. She continues to do fairly well on therapy. She does endorse occasional neck pain and feels it may be related to sleep positioning with CPAP. She has had some chest pain in the morning when waking. Feels tightness when getting out of the bed. Has to have her husband help lift her out of bed. Once she gets up and moves around the pain/heaviness gets better. She reports following with her PCP closely. Workup has been unremarkable. She is a smoker. No concerns with machine or supplies.   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     HISTORY: (copied from Dr Dail Drought previous note)  Kristina Savage is a 63 year old left-handed woman with an underlying medical history of reflux disease, prediabetes, diverticulitis, dizziness, anxiety, depression, smoking, and severe obesity with a BMI of over 40, who presents for follow-up consultation of her obstructive sleep apnea after starting AutoPap therapy.  The patient is accompanied by her husband today.  I first met her at the request of her primary care physician on 08/04/2020, at which time we talked about her tremor of approximately 1 years duration.  She had no signs of parkinsonism.  We also talked about her sleepiness during the day as well as snoring.  She was advised to proceed with a brain MRI with and without contrast as well as a sleep study.  She had a brain MRI with and without contrast on 08/17/2020 and I reviewed the results:IMPRESSION: This MRI of the brain with and without contrast shows the following: 1.   No acute findings.  Normal enhancement pattern. 2.   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 3.   Some scattered T2/FLAIR hyperintense foci in the hemispheres and pons consistent with mild chronic microvascular ischemic change. She was notified by phone call.  She had a home sleep test on 08/27/2020, which indicated severe obstructive sleep apnea with an AHI of 36.1/h, O2 nadir 81%.  She was advised to start AutoPap therapy.  Her set up date was 09/09/2021.  She has a ResMed air sense 10 AutoSet machine.  Today, 11/02/2021: I reviewed her AutoPap compliance data from 09/09/2021 through 10/08/2021, which is a total of 30 days, during which time she used her machine every night with percent use days greater than 4 hours at 100%, indicating superb compliance with an average usage of 7 hours and 21 minutes, residual AHI at goal but on the higher side, at 4.6/h, average pressure for the 95th percentile at 11.6 cm with a range of 6 to 12 cm with EPR, leak on the higher side with the 95th percentile at 28.1 L/min.  Leak has increased in the past few weeks.  She reports that she has loosened the headgear a little bit, she can tighten it again.  She has been using an under the nose style fullface mask with good success overall.  She has changed the filter already once.  She is mindful of the cleanliness with the machine and uses distilled water in the humidifier, overall, she feels much improved in terms of  her sleep quality and sleep consolidation.  Her husband endorses that she sleeps quieter, no longer snores when she is on the machine but starts snoring if she takes the mask off.  She has been working on weight loss but would like to talk to her primary care about a referral to a weight management clinic.  She does have some mouth dryness.  She has better daytime energy and less daytime sleepiness.  She is very motivated to continue with treatment with her AutoPap machine.  Sometimes when she wakes up in the middle of the night and the pressure feels too  high she turns the machine off and on again.  She has seen neurology through [facility] clinic.  She had evaluation through a movement disorders clinic as well for concern for parkinsonism.  She had a DaTscan last year which was reported as normal.  She recently presented to the emergency room with intermittent difficulty getting her words out.  She had an MR angiogram of the head and neck as well as an MRI of the brain and I reviewed the results: IMPRESSION: 1. No acute intracranial pathology. 2. 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 Scattered small foci of FLAIR signal abnormality in the subcortical and periventricular white matter are nonspecific but most likely reflects sequela of mild chronic white matter microangiopathy. 3. Patent vasculature of the head and neck with no significant stenosis or occlusion. She has an appointment pending with Dr. Mason Sole.   REVIEW OF SYSTEMS: Out of a complete 14 system review of symptoms, the patient complains only of the following symptoms, tremor, anxiety, chest tightness, and all other reviewed systems are negative.  ESS: 8/24  ALLERGIES: Allergies  Allergen Reactions   Oxycodone -Acetaminophen  Nausea And Vomiting   Percocet [Oxycodone -Acetaminophen ] Nausea And Vomiting   Pollen Extract     Nasal congestion   Prozac  [Fluoxetine ] Nausea Only    HOME MEDICATIONS: Outpatient Medications Prior to Visit  Medication Sig Dispense Refill   aspirin  EC 81 MG tablet Take 81 mg by mouth daily. Swallow whole.     atorvastatin  (LIPITOR) 10 MG tablet Take 1 tablet (10 mg total) by mouth daily. 90 tablet 3   clonazePAM  (KLONOPIN ) 0.5 MG tablet Take 0.5-1 tablets (0.25-0.5 mg total) by mouth as directed. Take half to 1 tablet 2-3 times a week for severe anxiety, agitation 15 tablet 1   DULoxetine  (CYMBALTA ) 30 MG capsule TAKE 1 CAPSULE BY MOUTH DAILY IN THE MORNING AND 2 CAPSULES DAILY AT BEDTIME AS DIRECTED 270 capsule 0   No  facility-administered medications prior to visit.    PAST MEDICAL HISTORY: Past Medical History:  Diagnosis Date   Anxiety    B12 deficiency    Chicken pox    Depression    Diverticulitis    Dizziness    Genital warts    GERD (gastroesophageal reflux disease)    Hay fever    Hyperlipidemia    Ovarian cyst    Parkinson's disease (HCC)    Dr.Shaw [place] and at [facility].  Functional/neurological condition.   Poor balance    Pre-diabetes    Sleep apnea    Stroke [facility])    UTI (lower urinary tract infection)    Vitamin D  deficiency     PAST SURGICAL HISTORY: Past Surgical History:  Procedure Laterality Date   CERVIX REMOVAL     Laser surgery, not complete removal   CESAREAN SECTION     CHONDROPLASTY Right 06/11/2020   Procedure: CHONDROPLASTY;  Surgeon:  Winston Hawking, MD;  Location: [facility];  Service: Orthopedics;  Laterality: Right;   KNEE ARTHROSCOPY WITH MEDIAL MENISECTOMY Right 06/11/2020   Procedure: KNEE ARTHROSCOPY WITH MEDIAL MENISECTOMY;  Surgeon: Winston Hawking, MD;  Location: [facility];  Service: Orthopedics;  Laterality: Right;    FAMILY HISTORY: Family History  Problem Relation Age of Onset   Arthritis Mother    Hypertension Mother    COPD Mother    Heart failure Mother    Heart disease Mother    Depression Mother    Eating disorder Mother    Obesity Mother    Arthritis Father    Thyroid  disease Father        thinks cancer   Parkinson's disease Father    Alcohol abuse Sister    Breast cancer Maternal Grandmother    Diabetes Paternal Grandmother    Parkinson's disease Paternal Grandmother    Diabetes Son    Cancer Maternal Aunt        Female cancer - not sure what   Breast cancer Maternal Aunt    Prostate cancer Maternal Uncle    Colon cancer Maternal Uncle 45    SOCIAL HISTORY: Social History   Socioeconomic History   Marital status: Married    Spouse name: Chalice Colt   Number of children: 4   Years of  education: High school   Highest education level: Not on file  Occupational History   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  Tobacco Use   Smoking status: Every Day    Current packs/day: 0.25    Average packs/day: 0.3 packs/day for 30.0 years (7.5 ttl pk-yrs)    Types: Cigarettes    Passive exposure: Past   Smokeless tobacco: Never  Vaping Use   Vaping status: Not on file  Substance and Sexual Activity   Alcohol use: Not Currently    Alcohol/week: 0.0 standard drinks of alcohol    Comment: once a year   Drug use: No   Sexual activity: Yes    Birth control/protection: Post-menopausal  Other Topics Concern   Not on file  Social History Narrative   04/24/20   From: [place]  - moved to [place] to raise her family   Living: with husband, Chalice Colt (1991)         Family: 7 children - including step children - twin boys nearby and daughter nearby - 10 grandchildren      Enjoys: spend time with family      Exercise: knee limiting exercise - tries to walk   Diet: eats whatever      Safety   Seat belts: Yes    Guns: Yes  and secure   Safe in relationships: Yes    Social Drivers of Corporate investment banker Strain: Not on file  Food Insecurity: Not on file  Transportation Needs: Not on file  Physical Activity: Not on file  Stress: Not on file  Social Connections: Not on file  Intimate Partner Violence: Not on file     PHYSICAL EXAM  There were no vitals filed for this visit.  There is no height or weight on file to calculate BMI.  Generalized: Well developed, in no acute distress  Cardiology: normal rate and rhythm, no murmur noted Respiratory: clear to auscultation bilaterally  Neurological examination  Mentation: Alert oriented to time, place, history taking. Follows all commands speech and language fluent Cranial nerve II-XII: Pupils were equal round reactive to light. Extraocular movements were full,  visual field were full  Motor: The motor testing reveals 5  over 5 strength of all 4 extremities. Good symmetric motor tone is noted throughout. Functional tremor noted throughout visit.  Gait and station: Gait is normal.    DIAGNOSTIC DATA (LABS, IMAGING, TESTING) - I reviewed patient records, labs, notes, testing and imaging myself where available.      No data to display           Lab Results  Component Value Date   WBC 7.2 04/06/2023   HGB 13.5 04/06/2023   HCT 42.5 04/06/2023   MCV 85.5 04/06/2023   PLT 272.0 04/06/2023      Component Value Date/Time   NA 137 10/08/2022 1240   NA 144 02/02/2022 1105   K 4.2 10/08/2022 1240   CL 101 10/08/2022 1240   CO2 28 10/08/2022 1240   GLUCOSE 81 10/08/2022 1240   BUN 18 10/08/2022 1240   BUN 16 02/02/2022 1105   CREATININE 0.59 10/08/2022 1240   CALCIUM  9.6 10/08/2022 1240   PROT 7.4 10/08/2022 1240   PROT 7.0 02/02/2022 1105   ALBUMIN 3.9 10/08/2022 1240   ALBUMIN 4.6 02/02/2022 1105   AST 15 10/08/2022 1240   ALT 19 10/08/2022 1240   ALKPHOS 118 (H) 10/08/2022 1240   BILITOT 0.6 10/08/2022 1240   BILITOT 0.5 02/02/2022 1105   GFRNONAA >60 03/13/2022 0845   GFRAA >90 07/18/2014 0738   Lab Results  Component Value Date   CHOL 141 02/02/2022   HDL 58 02/02/2022   LDLCALC 69 02/02/2022   TRIG 67 02/02/2022   CHOLHDL 2 03/10/2021   Lab Results  Component Value Date   HGBA1C 6.0 (H) 06/30/2022   Lab Results  Component Value Date   VITAMINB12 443 06/30/2022   Lab Results  Component Value Date   TSH 2.030 02/02/2022     ASSESSMENT AND PLAN 64 y.o. year old female  has a past medical history of Anxiety, B12 deficiency, Chicken pox, Depression, Diverticulitis, Dizziness, Genital warts, GERD (gastroesophageal reflux disease), Hay fever, Hyperlipidemia, Ovarian cyst, Parkinson's disease (HCC), Poor balance, Pre-diabetes, Sleep apnea, Stroke (HCC), UTI (lower urinary tract infection), and Vitamin D  deficiency. here with   No diagnosis found.     Kristina Savage is doing  well on CPAP therapy. Compliance report reveals excellent daily and optimal 4 hour compliance. She was encouraged to continue using CPAP nightly and for greater than 4 hours each night. AHI well managed. Leak has improved. I suspect musculoskeletal and or psychological component to chest pain. Workup reportedly normal. We will update supply orders as indicated. Risks of untreated sleep apnea review and education materials provided. Healthy lifestyle habits encouraged. She will follow up in 1 year, sooner if needed. She verbalizes understanding and agreement with this plan.    No orders of the defined types were placed in this encounter.    No orders of the defined types were placed in this encounter.     Terrilyn Fick, FNP-C 12/12/2023, 3:38 PM [facility] [address] [place], [place] [address] [phone]

## 2023-12-12 NOTE — Patient Instructions (Incomplete)

## 2023-12-13 ENCOUNTER — Encounter: Payer: Self-pay | Admitting: Family Medicine

## 2023-12-13 ENCOUNTER — Ambulatory Visit: Payer: Medicaid Other | Admitting: Family Medicine

## 2024-01-02 ENCOUNTER — Ambulatory Visit: Admitting: Podiatry

## 2024-01-02 ENCOUNTER — Ambulatory Visit (INDEPENDENT_AMBULATORY_CARE_PROVIDER_SITE_OTHER)

## 2024-01-02 ENCOUNTER — Encounter: Payer: Self-pay | Admitting: Podiatry

## 2024-01-02 DIAGNOSIS — M19071 Primary osteoarthritis, right ankle and foot: Secondary | ICD-10-CM

## 2024-01-02 DIAGNOSIS — M19072 Primary osteoarthritis, left ankle and foot: Secondary | ICD-10-CM | POA: Diagnosis not present

## 2024-01-02 DIAGNOSIS — M722 Plantar fascial fibromatosis: Secondary | ICD-10-CM | POA: Diagnosis not present

## 2024-01-02 MED ORDER — CELECOXIB 100 MG PO CAPS: 100.0000 mg | ORAL_CAPSULE | Freq: Two times a day (BID) | ORAL | 2 refills | Status: AC

## 2024-01-02 NOTE — Progress Notes (Signed)
 Subjective:  Patient ID: Kristina Savage, female    DOB: January 04, 1961,  MRN: [id number]  Chief Complaint  Patient presents with   Foot Pain    "My feet hurt up on top.  Feels like my arches are crushed."    Discussed the use of AI scribe software for clinical note transcription with the patient, who gave verbal consent to proceed.  History of Present Illness [name] is a 63 year old female with a history of ischemic stroke who presents with bilateral foot pain.  She experiences severe pain on the top of both feet, describing it as feeling like her 'arches are crushed,' with the left foot being more affected. The pain is persistent, rated as ten out of ten, and is exacerbated by standing and walking, making it difficult to wear shoes or engage in daily activities.  She notes significant swelling and bruising-like marks on the top of her feet due to the swelling. Her arches, which were previously high, appear to have fallen. The pain impacts her mobility to the extent that she sometimes requires the use of two walkers to move around. She experiences significant pain upon waking and during the night, affecting her ability to perform basic activities like going to the bathroom.  She has a history of ischemic stroke and takes aspirin  81 mg daily. She is unsure about the use of other pain medications due to her stroke history and has been using Tylenol  for pain relief. No history of heart attack.  Her knee pain has subsided, but the pain has now localized to her feet. No pain in other areas of the foot besides the top.      Objective:    Physical Exam VASCULAR: DP and PT pulse palpable. Foot is warm and well-perfused. Capillary fill time is brisk. DERMATOLOGIC: Normal skin turgor, texture, and temperature. No open lesions, rashes, or ulcerations. NEUROLOGIC: Normal sensation to light touch and pressure. No paresthesias. ORTHOPEDIC: Slight pes cavus foot type. Tinnitus and edema on the  dorsal midfoot, notably around the left tarsometatarsal joint. Pain worse with Pinocchi test of the first and third metatarsals. Similar findings on the right, less severe. Smooth pain-free range of motion of all examined joints. No ecchymosis or bruising. No gross deformity. No pain to palpation.   No images are attached to the encounter.    Results RADIOLOGY Bilateral foot radiographs: Mild to moderate degenerative changes in the midfoot, including degenerative changes of the second tarsometatarsal (TMT) joint on the right, third TMT joint on the left, and navicular-cuneiform joints. (01/02/2024)   Assessment:   1. Arthritis of both midfeet      Plan:  Patient was evaluated and treated and all questions answered.  Assessment and Plan Assessment & Plan Degenerative arthritis of the midfoot Bilateral foot pain with swelling and bruising, described as feeling like the arches are crushed. Radiographs show mild to moderate degenerative changes in the midfoot, including the second TMT on the right, third TMT on the left, and navicular cuneiform joints. The left foot is worse than the right. Examination reveals tinnitus and edema on the dorsal midfoot, particularly around the left tarsometatarsal joint. The condition is consistent with wear and tear arthritis, with cartilage loss contributing to pain. - Prescribe Celebrex 100 mg for arthritis pain management. - Recommend use of Power Steps or Super Feet arch supports to reduce foot motion and pain. - Provide Voltaren gel for topical pain relief. - Consider steroid injection into or around the joint  if pain persists despite medication and arch support. - Discuss surgical options as a last resort if conservative measures fail.        Return in about 3 months (around 04/03/2024) for follow up on arthritis.

## 2024-01-02 NOTE — Patient Instructions (Addendum)
                         Contains text generated by Abridge.                                 Contains text generated by Abridge.

## 2024-01-04 ENCOUNTER — Other Ambulatory Visit: Payer: Self-pay | Admitting: Family

## 2024-01-04 DIAGNOSIS — E782 Mixed hyperlipidemia: Secondary | ICD-10-CM

## 2024-01-04 DIAGNOSIS — I679 Cerebrovascular disease, unspecified: Secondary | ICD-10-CM

## 2024-04-04 ENCOUNTER — Ambulatory Visit: Admitting: Podiatry

## 2024-05-28 ENCOUNTER — Encounter: Payer: Self-pay | Admitting: Radiology

## 2024-06-26 ENCOUNTER — Encounter: Payer: Self-pay | Admitting: General Practice

## 2024-06-26 ENCOUNTER — Ambulatory Visit: Payer: Self-pay | Admitting: General Practice

## 2024-06-26 VITALS — BP 112/68 | HR 75 | Temp 98.3°F | Ht 64.0 in | Wt 240.0 lb

## 2024-06-26 DIAGNOSIS — R600 Localized edema: Secondary | ICD-10-CM

## 2024-06-26 LAB — COMPREHENSIVE METABOLIC PANEL WITH GFR
ALT: 14 U/L (ref 0–35)
AST: 16 U/L (ref 0–37)
Albumin: 4.1 g/dL (ref 3.5–5.2)
Alkaline Phosphatase: 108 U/L (ref 39–117)
BUN: 18 mg/dL (ref 6–23)
CO2: 30 meq/L (ref 19–32)
Calcium: 9.2 mg/dL (ref 8.4–10.5)
Chloride: 105 meq/L (ref 96–112)
Creatinine, Ser: 0.5 mg/dL (ref 0.40–1.20)
GFR: 101.01 mL/min (ref >60.00)
Glucose, Bld: 88 mg/dL (ref 70–99)
Potassium: 3.9 meq/L (ref 3.5–5.1)
Sodium: 141 meq/L (ref 135–145)
Total Bilirubin: 0.9 mg/dL (ref 0.2–1.2)
Total Protein: 6.4 g/dL (ref 6.0–8.3)

## 2024-06-26 LAB — CBC
HCT: 37.7 % (ref 36.0–46.0)
Hemoglobin: 12.4 g/dL (ref 12.0–15.0)
MCHC: 32.9 g/dL (ref 30.0–36.0)
MCV: 84.2 fl (ref 78.0–100.0)
Platelets: 226 K/uL (ref 150.0–400.0)
RBC: 4.47 Mil/uL (ref 3.87–5.11)
RDW: 14.3 % (ref 11.5–15.5)
WBC: 5.7 K/uL (ref 4.0–10.5)

## 2024-06-26 LAB — BRAIN NATRIURETIC PEPTIDE: Pro B Natriuretic peptide (BNP): 48 pg/mL (ref 0.0–100.0)

## 2024-06-26 NOTE — Progress Notes (Signed)
 Established Patient Office Visit  Subjective   Patient ID: Kristina Savage, female    DOB: December 30, 1960  Age: 63 y.o. MRN: [id number]  Chief Complaint  Patient presents with   Edema    In both legs and feet x couple months but patient didn't have insurance to be seen until yesterday. Patient has a lot of pain going down her legs. Patient elevates her legs all day; if she gets up to do anything they swell. Patient states her feet are pitted and legs are hot to the touch.     HPI  Kristina Savage is a 63 year old female, patient of Ginger Patrick, NP, presents today for an acute visit.   Discussed the use of AI scribe software for clinical note transcription with the patient, who gave verbal consent to proceed.  History of Present Illness Kristina Savage is a 63 year old female who presents with bilateral leg swelling and pain. She is accompanied by her daughter, Manuelita.  She has been experiencing bilateral leg swelling for over six months. The swelling began after a fall last year around Halloween, initially causing knee pain and a recommendation for knee replacement. Although the knee pain subsided, the swelling and pain extended to her feet, making it difficult to stand or walk. The swelling is described as 'tight' and 'hot', present from her knees down to her feet.  She has been using compression socks and elevating her legs, which provides some relief. She also applies Voltaren cream up to four times a day for pain relief. Despite these measures, the swelling persists, and she experiences significant discomfort, especially upon standing or walking. Her balance is affected, and she often feels like she might fall, even when using a cane.  No chest pain or shortness of breath, but she cannot walk fast due to pain and balance issues. She has not been diagnosed with diabetes or hypertension. A previous concern for a blood clot behind her knee was ruled out after testing.   Her social history  includes smoking, but she is attempting to quit using a Breathe Free program she received for Christmas.     Patient Active Problem List   Diagnosis Date Noted   Contact with and (suspected) exposure to covid-19 04/06/2023   Effusion of left knee joint 02/17/2023   Seborrheic keratoses 01/13/2023   Class 2 severe obesity due to excess calories with serious comorbidity and body mass index (BMI) of 38.0 to 38.9 in adult 08/30/2022   B12 deficiency 06/30/2022   Parkinson's disease (HCC) 06/30/2022   Tobacco abuse 06/30/2022   Vitamin D  deficiency 02/02/2022   Mood disorder (HCC)- emotional eating 02/02/2022   MDD (major depressive disorder), recurrent episode, moderate (HCC) 09/23/2021   GAD (generalized anxiety disorder) 09/23/2021   Social anxiety disorder 09/23/2021   Primary parkinsonism (HCC) 01/02/2021   Cerebral vascular disease 08/21/2020   Mixed hyperlipidemia 08/21/2020   Balance problem 08/21/2020   Benign paroxysmal positional vertigo 08/21/2020   Postural dizziness 08/21/2020   Tobacco use disorder 06/02/2020   Functional tremor 05/15/2020   Memory change 05/15/2020   Prediabetes 04/24/2020   Diverticulitis of colon 08/13/2014   Ovarian cyst 08/13/2014   Past Medical History:  Diagnosis Date   Anxiety    B12 deficiency    Chicken pox    Depression    Diverticulitis    Dizziness    Genital warts    GERD (gastroesophageal reflux disease)    Hay fever  Hyperlipidemia    Ovarian cyst    Parkinson's disease (HCC)    Dr.Shaw [place] and at [facility].  Functional/neurological condition.   Poor balance    Pre-diabetes    Sleep apnea    Stroke [facility])    UTI (lower urinary tract infection)    Vitamin D  deficiency    Past Surgical History:  Procedure Laterality Date   CERVIX REMOVAL     Laser surgery, not complete removal   CESAREAN SECTION     CHONDROPLASTY Right 06/11/2020   Procedure: CHONDROPLASTY;  Surgeon: Kay Kemps, MD;  Location: [facility];  Service: Orthopedics;  Laterality: Right;   KNEE ARTHROSCOPY WITH MEDIAL MENISECTOMY Right 06/11/2020   Procedure: KNEE ARTHROSCOPY WITH MEDIAL MENISECTOMY;  Surgeon: Kay Kemps, MD;  Location: [facility];  Service: Orthopedics;  Laterality: Right;   Allergies  Allergen Reactions   Oxycodone -Acetaminophen  Nausea And Vomiting   Percocet [Oxycodone -Acetaminophen ] Nausea And Vomiting   Pollen Extract     Nasal congestion   Prozac  [Fluoxetine ] Nausea Only         12/30/2022    3:09 PM 08/30/2022   10:43 AM 06/08/2022    9:17 AM  Depression screen PHQ 2/9  Decreased Interest  1 0  Down, Depressed, Hopeless  1 0  PHQ - 2 Score  2 0  Altered sleeping  1 0  Tired, decreased energy  2 0  Change in appetite  0 0  Feeling bad or failure about yourself   0 0  Trouble concentrating  0 0  Moving slowly or fidgety/restless  1 0  Suicidal thoughts  0 0  PHQ-9 Score  6  0   Difficult doing work/chores  Somewhat difficult Not difficult at all     Information is confidential and restricted. Go to Review Flowsheets to unlock data.   Data saved with a previous flowsheet row definition       12/30/2022    3:10 PM 08/30/2022   10:43 AM 04/13/2022   10:56 AM 12/23/2021    2:54 PM  GAD 7 : Generalized Anxiety Score  Nervous, Anxious, on Edge  1    Control/stop worrying  2    Worry too much - different things  2    Trouble relaxing  0    Restless  0    Easily annoyed or irritable  0    Afraid - awful might happen  0    Total GAD 7 Score  5    Anxiety Difficulty  Not difficult at all       Information is confidential and restricted. Go to Review Flowsheets to unlock data.      Review of Systems  Constitutional:  Negative for chills and fever.  Respiratory:  Negative for shortness of breath.   Cardiovascular:  Positive for leg swelling. Negative for chest pain.  Gastrointestinal:  Negative for abdominal pain, constipation, diarrhea, heartburn, nausea and  vomiting.  Genitourinary:  Negative for dysuria, frequency and urgency.  Neurological:  Negative for dizziness and headaches.  Endo/Heme/Allergies:  Negative for polydipsia.  Psychiatric/Behavioral:  Negative for depression and suicidal ideas. The patient is not nervous/anxious.       Objective:     BP 112/68   Pulse 75   Temp 98.3 F (36.8 C) (Temporal)   Ht 5' 4 (1.626 m)   Wt 240 lb (108.9 kg)   SpO2 98%   BMI 41.20 kg/m  BP Readings from Last 3 Encounters:  06/26/24 112/68  08/04/23 (!) 147/86  08/04/23 122/86   Wt Readings from Last 3 Encounters:  06/26/24 240 lb (108.9 kg)  08/04/23 243 lb 12.8 oz (110.6 kg)  08/04/23 245 lb 12.8 oz (111.5 kg)      Physical Exam Vitals and nursing note reviewed.  Constitutional:      Appearance: Normal appearance.  Eyes:     Conjunctiva/sclera: Conjunctivae normal.  Cardiovascular:     Rate and Rhythm: Normal rate and regular rhythm.     Pulses: Normal pulses.     Heart sounds: Normal heart sounds.  Pulmonary:     Effort: Pulmonary effort is normal.     Breath sounds: Normal breath sounds.  Musculoskeletal:     Right lower leg: No deformity, lacerations, tenderness or bony tenderness. 1+ Pitting Edema present.     Left lower leg: No deformity, lacerations, tenderness or bony tenderness. 1+ Pitting Edema present.  Neurological:     Mental Status: She is alert and oriented to person, place, and time.  Psychiatric:        Mood and Affect: Mood normal.        Behavior: Behavior normal.        Thought Content: Thought content normal.        Judgment: Judgment normal.      No results found for any visits on 06/26/24.     The ASCVD Risk score (Arnett DK, et al., 2019) failed to calculate for the following reasons:   Risk score cannot be calculated because patient has a medical history suggesting prior/existing ASCVD    Assessment & Plan:  Lower extremity edema -     CBC -     Brain natriuretic peptide -      Comprehensive metabolic panel with GFR -     D-dimer, quantitative    Assessment and Plan Assessment & Plan Chronic bilateral lower extremity edema Chronic bilateral edema with pain and swelling. Compression socks and leg elevation provide relief.  - No suspicion for DVT. -Will check labs including CBC, CMP, BNP, D-dimer.  No red flags on exam. -  Continue compression socks. - Elevate legs and apply ice. - Use Voltaren cream up to four times daily. - Follow up in two weeks. -If kidney functions are all within normal limits, will consider diuretic.   Return in about 2 weeks (around 07/10/2024) for bilateral lower extremity edema.    Carrol Aurora, NP

## 2024-06-26 NOTE — Patient Instructions (Addendum)
 Stop by the lab prior to leaving today. I will notify you of your results once received.    Continue compression socks, elevation of legs and icing.   Follow up in 2 weeks with PCP.   It was a pleasure to see you today!

## 2024-06-27 ENCOUNTER — Ambulatory Visit: Payer: Self-pay | Admitting: General Practice

## 2024-06-27 ENCOUNTER — Encounter: Payer: Self-pay | Admitting: *Deleted

## 2024-06-27 ENCOUNTER — Telehealth: Payer: Self-pay | Admitting: Internal Medicine

## 2024-06-27 DIAGNOSIS — R600 Localized edema: Secondary | ICD-10-CM

## 2024-06-27 DIAGNOSIS — R7989 Other specified abnormal findings of blood chemistry: Secondary | ICD-10-CM

## 2024-06-27 LAB — D-DIMER, QUANTITATIVE: D-Dimer, Quant: 2.39 ug{FEU}/mL — ABNORMAL HIGH (ref <0.50)

## 2024-06-27 NOTE — Telephone Encounter (Signed)
 Noted

## 2024-06-27 NOTE — Telephone Encounter (Signed)
 Notified of positive d-dimer overnight. In review of note from visit there is very low suspicion of DVT and no signs/symptoms of PE so will defer to provider who saw patient if bilateral US  to rule out DVT is appropriate.

## 2024-06-27 NOTE — Telephone Encounter (Signed)
 See results management for orders.

## 2024-06-27 NOTE — Telephone Encounter (Signed)
 STAT US  was ordered and referral team notified of STAT order for immediate scheduling.

## 2024-06-27 NOTE — Telephone Encounter (Signed)
 Kristina Savage

## 2024-06-27 NOTE — Telephone Encounter (Signed)
 Please see access nurse note and note from Dr Rollene below.  Elevated d dimer 2.39. sending to B [facility]e NP and [place] pool and will take to [place] area.

## 2024-06-28 ENCOUNTER — Other Ambulatory Visit (INDEPENDENT_AMBULATORY_CARE_PROVIDER_SITE_OTHER): Payer: Self-pay

## 2024-06-28 DIAGNOSIS — R600 Localized edema: Secondary | ICD-10-CM

## 2024-06-28 DIAGNOSIS — R7989 Other specified abnormal findings of blood chemistry: Secondary | ICD-10-CM

## 2024-06-29 ENCOUNTER — Telehealth: Payer: Self-pay | Admitting: *Deleted

## 2024-06-29 NOTE — Telephone Encounter (Signed)
 Copied from CRM #[id number]. Topic: Clinical - Lab/Test Results >> Jun 29, 2024  2:13 PM Franky GRADE wrote: Reason for CRM: Patient is calling because she had the ultrasound done to rule on a DVT, she would like to know the results and to see if the provider would be able to prescribe medication for the discomfort.

## 2024-06-29 NOTE — Telephone Encounter (Signed)
 Called patient and advised patient that read over is not back yet from radiologist and that we will call as soon as Carrol is able to review the results. Advised patient that Carrol is out of the office today and will call her back with response as soon as response is given.

## 2024-07-02 NOTE — Telephone Encounter (Signed)
 Patient was advised of this Friday and was let know she will be called once they are back.

## 2024-07-04 ENCOUNTER — Telehealth: Payer: Self-pay | Admitting: General Practice

## 2024-07-04 ENCOUNTER — Ambulatory Visit: Payer: Self-pay | Admitting: General Practice

## 2024-07-04 DIAGNOSIS — R600 Localized edema: Secondary | ICD-10-CM

## 2024-07-04 NOTE — Telephone Encounter (Signed)
 Copied from CRM [id number]. Topic: Clinical - Lab/Test Results >> Jul 04, 2024 11:34 AM Dedra B wrote: Reason for CRM: Pt called regarding vascular US  results. She said she was told when she had the US  that she didn't have any blood clots. She would also like something prescribed to help with the discomfort.

## 2024-07-04 NOTE — Telephone Encounter (Signed)
 Called [facility] they are sending to have released. Report should come in chart soon.

## 2024-07-06 MED ORDER — MELOXICAM 7.5 MG PO TABS: 7.5000 mg | ORAL_TABLET | Freq: Every day | ORAL | 0 refills | Status: DC | PRN

## 2024-08-03 ENCOUNTER — Telehealth: Payer: Self-pay | Admitting: General Practice

## 2024-08-03 DIAGNOSIS — R600 Localized edema: Secondary | ICD-10-CM

## 2024-08-03 NOTE — Telephone Encounter (Signed)
 Received letter that patient is on celebrex  and mobic .   Mobic  was only to be used until she followed up with pcp or ortho.   Will discontinue mobic  at this time.

## 2024-08-12 ENCOUNTER — Other Ambulatory Visit: Payer: Self-pay | Admitting: Podiatry
